# Patient Record
Sex: Male | Born: 1935
Health system: Southern US, Community
[De-identification: ages and names within clinical notes are randomized; demographics above are authoritative.]

## PROBLEM LIST (undated history)

## (undated) DIAGNOSIS — N400 Enlarged prostate without lower urinary tract symptoms: Secondary | ICD-10-CM

## (undated) DIAGNOSIS — I10 Essential (primary) hypertension: Secondary | ICD-10-CM

## (undated) DIAGNOSIS — M1990 Unspecified osteoarthritis, unspecified site: Secondary | ICD-10-CM

## (undated) DIAGNOSIS — R9431 Abnormal electrocardiogram [ECG] [EKG]: Secondary | ICD-10-CM

## (undated) DIAGNOSIS — H8110 Benign paroxysmal vertigo, unspecified ear: Secondary | ICD-10-CM

## (undated) DIAGNOSIS — T8859XA Other complications of anesthesia, initial encounter: Secondary | ICD-10-CM

## (undated) DIAGNOSIS — IMO0001 Reserved for inherently not codable concepts without codable children: Secondary | ICD-10-CM

## (undated) DIAGNOSIS — Z8679 Personal history of other diseases of the circulatory system: Secondary | ICD-10-CM

## (undated) DIAGNOSIS — T4145XA Adverse effect of unspecified anesthetic, initial encounter: Secondary | ICD-10-CM

## (undated) DIAGNOSIS — K219 Gastro-esophageal reflux disease without esophagitis: Secondary | ICD-10-CM

## (undated) DIAGNOSIS — R002 Palpitations: Secondary | ICD-10-CM

## (undated) HISTORY — DX: Essential (primary) hypertension: I10

## (undated) HISTORY — DX: Benign prostatic hyperplasia without lower urinary tract symptoms: N40.0

## (undated) HISTORY — DX: Personal history of other diseases of the circulatory system: Z86.79

## (undated) HISTORY — PX: TRANSURETHRAL RESECTION OF PROSTATE: SHX73

## (undated) HISTORY — PX: KIDNEY STONE SURGERY: SHX686

## (undated) HISTORY — PX: OTHER SURGICAL HISTORY: SHX169

## (undated) HISTORY — PX: JOINT REPLACEMENT: SHX530

## (undated) HISTORY — PX: ANKLE SURGERY: SHX546

## (undated) HISTORY — DX: Palpitations: R00.2

## (undated) HISTORY — DX: Benign paroxysmal vertigo, unspecified ear: H81.10

## (undated) HISTORY — PX: COLON SURGERY: SHX602

## (undated) HISTORY — PX: TONSILLECTOMY: SUR1361

---

## 1988-05-19 HISTORY — PX: HERNIA REPAIR: SHX51

## 1997-12-06 ENCOUNTER — Ambulatory Visit (HOSPITAL_COMMUNITY): Admission: RE | Admit: 1997-12-06 | Discharge: 1997-12-06 | Payer: Self-pay

## 1999-05-09 ENCOUNTER — Encounter: Payer: Self-pay | Admitting: General Surgery

## 1999-05-09 ENCOUNTER — Emergency Department (HOSPITAL_COMMUNITY): Admission: EM | Admit: 1999-05-09 | Discharge: 1999-05-09 | Payer: Self-pay | Admitting: Emergency Medicine

## 1999-06-12 ENCOUNTER — Encounter: Payer: Self-pay | Admitting: General Surgery

## 1999-06-17 ENCOUNTER — Encounter (INDEPENDENT_AMBULATORY_CARE_PROVIDER_SITE_OTHER): Payer: Self-pay

## 1999-06-17 ENCOUNTER — Inpatient Hospital Stay (HOSPITAL_COMMUNITY): Admission: RE | Admit: 1999-06-17 | Discharge: 1999-06-25 | Payer: Self-pay | Admitting: General Surgery

## 2000-02-11 ENCOUNTER — Ambulatory Visit (HOSPITAL_COMMUNITY): Admission: RE | Admit: 2000-02-11 | Discharge: 2000-02-11 | Payer: Self-pay | Admitting: Cardiology

## 2000-05-19 HISTORY — PX: CARDIAC CATHETERIZATION: SHX172

## 2001-01-04 ENCOUNTER — Encounter: Payer: Self-pay | Admitting: Emergency Medicine

## 2001-01-04 ENCOUNTER — Emergency Department (HOSPITAL_COMMUNITY): Admission: EM | Admit: 2001-01-04 | Discharge: 2001-01-05 | Payer: Self-pay | Admitting: Emergency Medicine

## 2001-01-06 ENCOUNTER — Ambulatory Visit (HOSPITAL_BASED_OUTPATIENT_CLINIC_OR_DEPARTMENT_OTHER): Admission: RE | Admit: 2001-01-06 | Discharge: 2001-01-06 | Payer: Self-pay | Admitting: Orthopedic Surgery

## 2001-11-02 ENCOUNTER — Encounter: Payer: Self-pay | Admitting: Gastroenterology

## 2001-11-02 ENCOUNTER — Encounter: Admission: RE | Admit: 2001-11-02 | Discharge: 2001-11-02 | Payer: Self-pay | Admitting: Gastroenterology

## 2002-03-15 ENCOUNTER — Ambulatory Visit (HOSPITAL_BASED_OUTPATIENT_CLINIC_OR_DEPARTMENT_OTHER): Admission: RE | Admit: 2002-03-15 | Discharge: 2002-03-15 | Payer: Self-pay | Admitting: Plastic Surgery

## 2002-03-15 ENCOUNTER — Encounter (INDEPENDENT_AMBULATORY_CARE_PROVIDER_SITE_OTHER): Payer: Self-pay | Admitting: Specialist

## 2003-01-13 IMAGING — CR DG HIP 1V PORT*L*
1 series · 1 of 1 positions shown · non-contrast
Comparison: [DATE].

CLINICAL DATA: Post-arthroplasty.
 PORTABLE LEFT HIP 1 VIEW ? [DATE]:

[view not recorded]
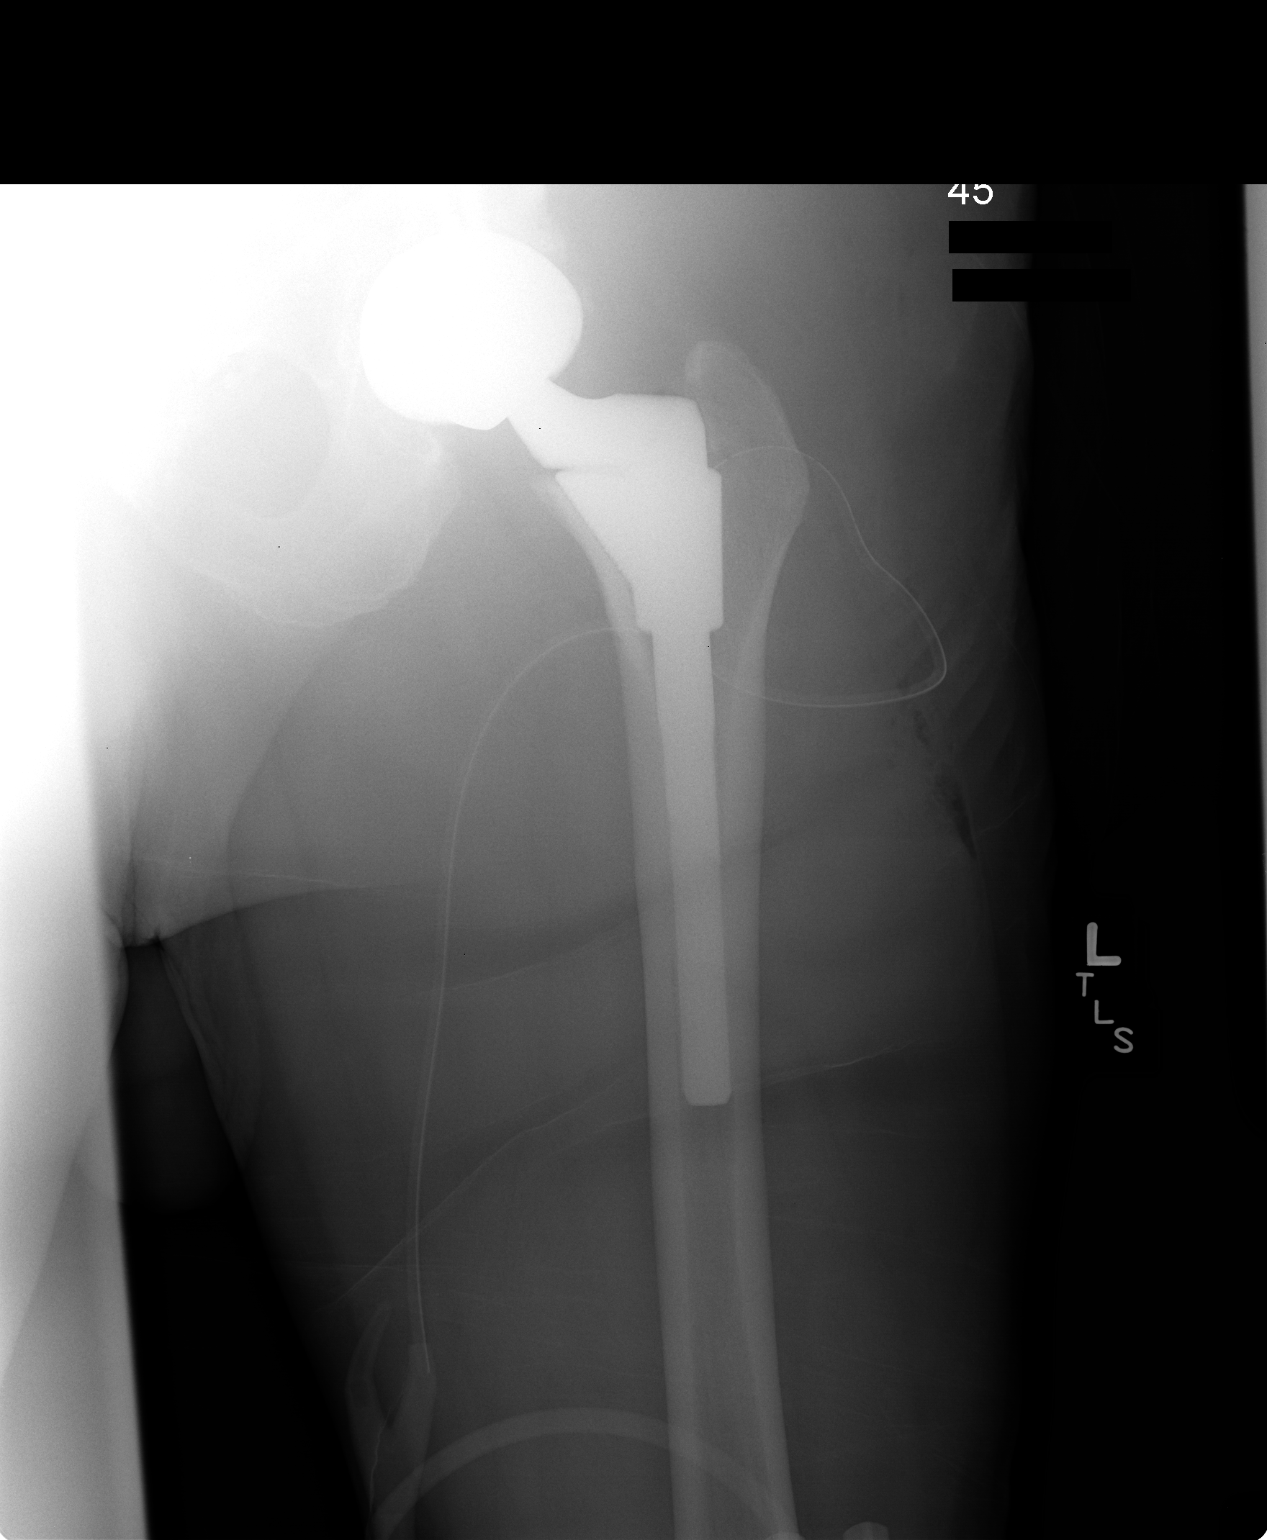

[1 of 1 positions shown; findings below may reference images not displayed]

Good position and alignment following left total hip replacement, in the AP projection.
IMPRESSION: Good position and alignment.

## 2004-05-22 IMAGING — CR DG HIP (WITH OR WITHOUT PELVIS) 2-3V*L*
4 series · 4 of 4 positions shown · non-contrast
Comparison: none

CLINICAL DATA: 68-year-old, osteoarthritis left hip. 
 2 VIEW LEFT HIP WITH AP PELVIS: 
 There is advanced osteoarthritic type degenerative change involving the left hip.  This is asymmetric; the right hip demonstrates minimal degenerative change.  I do not see any definite plain film findings to suggest avascular necrosis.  No acute bony findings and no destructive bony changes.  The pubic symphysis and SI joints are intact.

[view not recorded (1 of 4)]
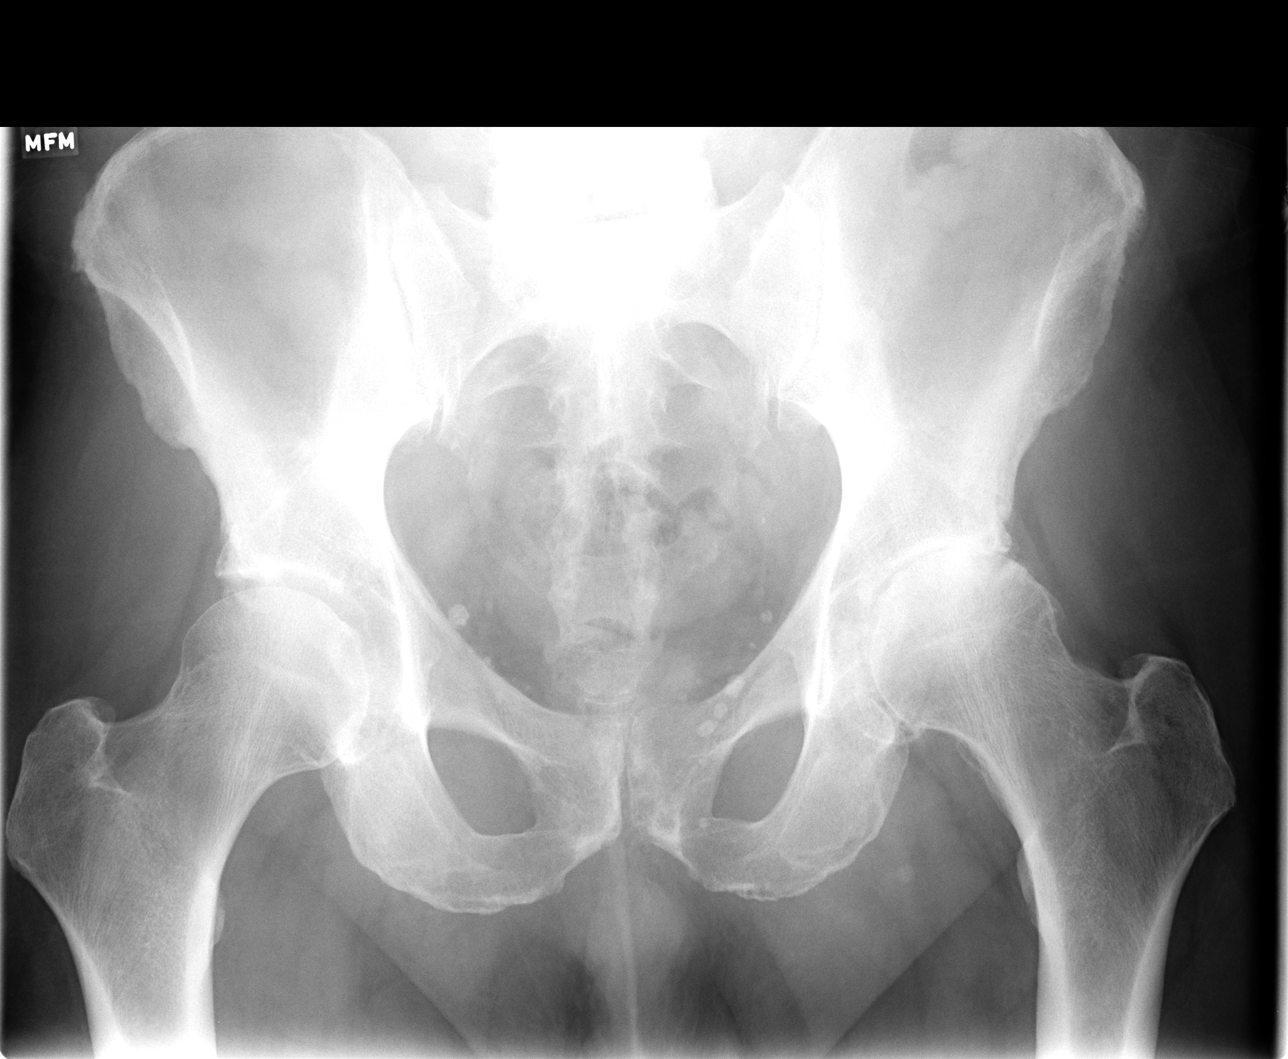

[view not recorded (2 of 4)]
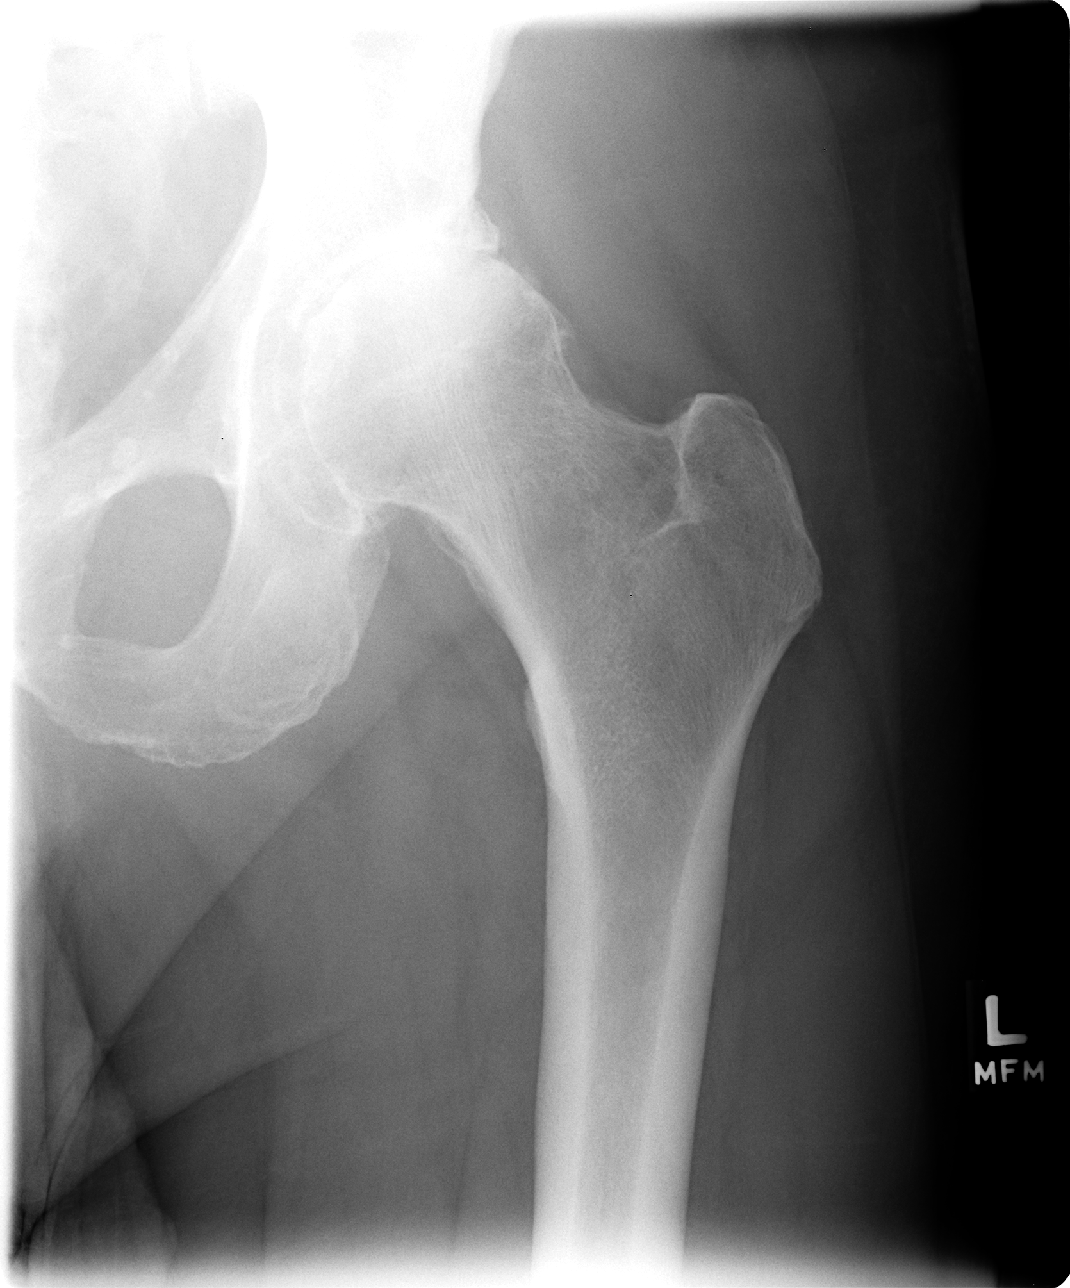

[view not recorded (3 of 4)]
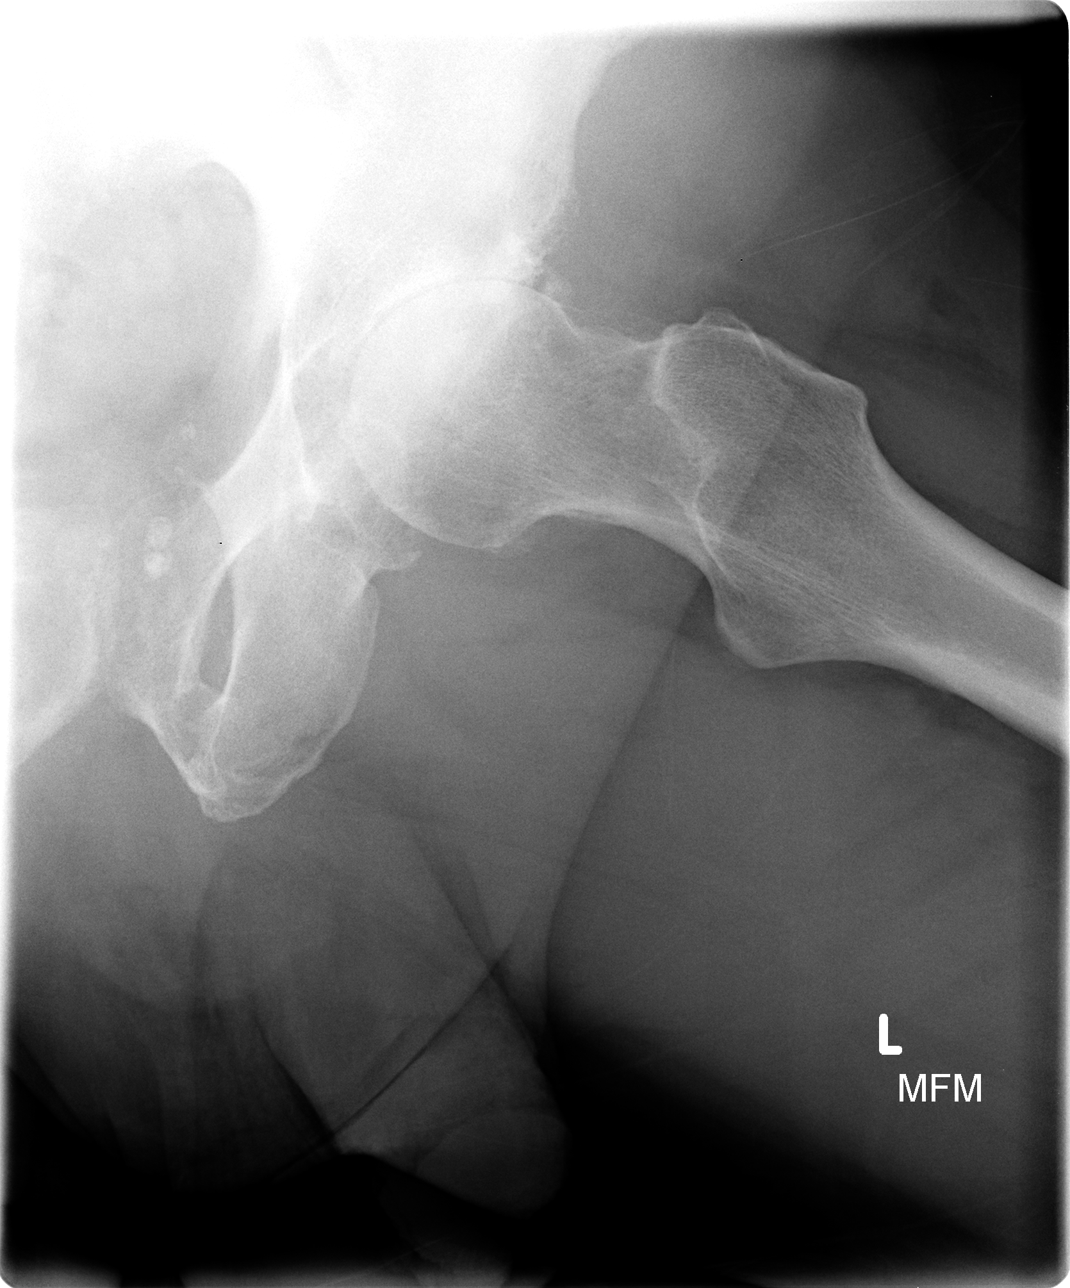

[view not recorded (4 of 4)]
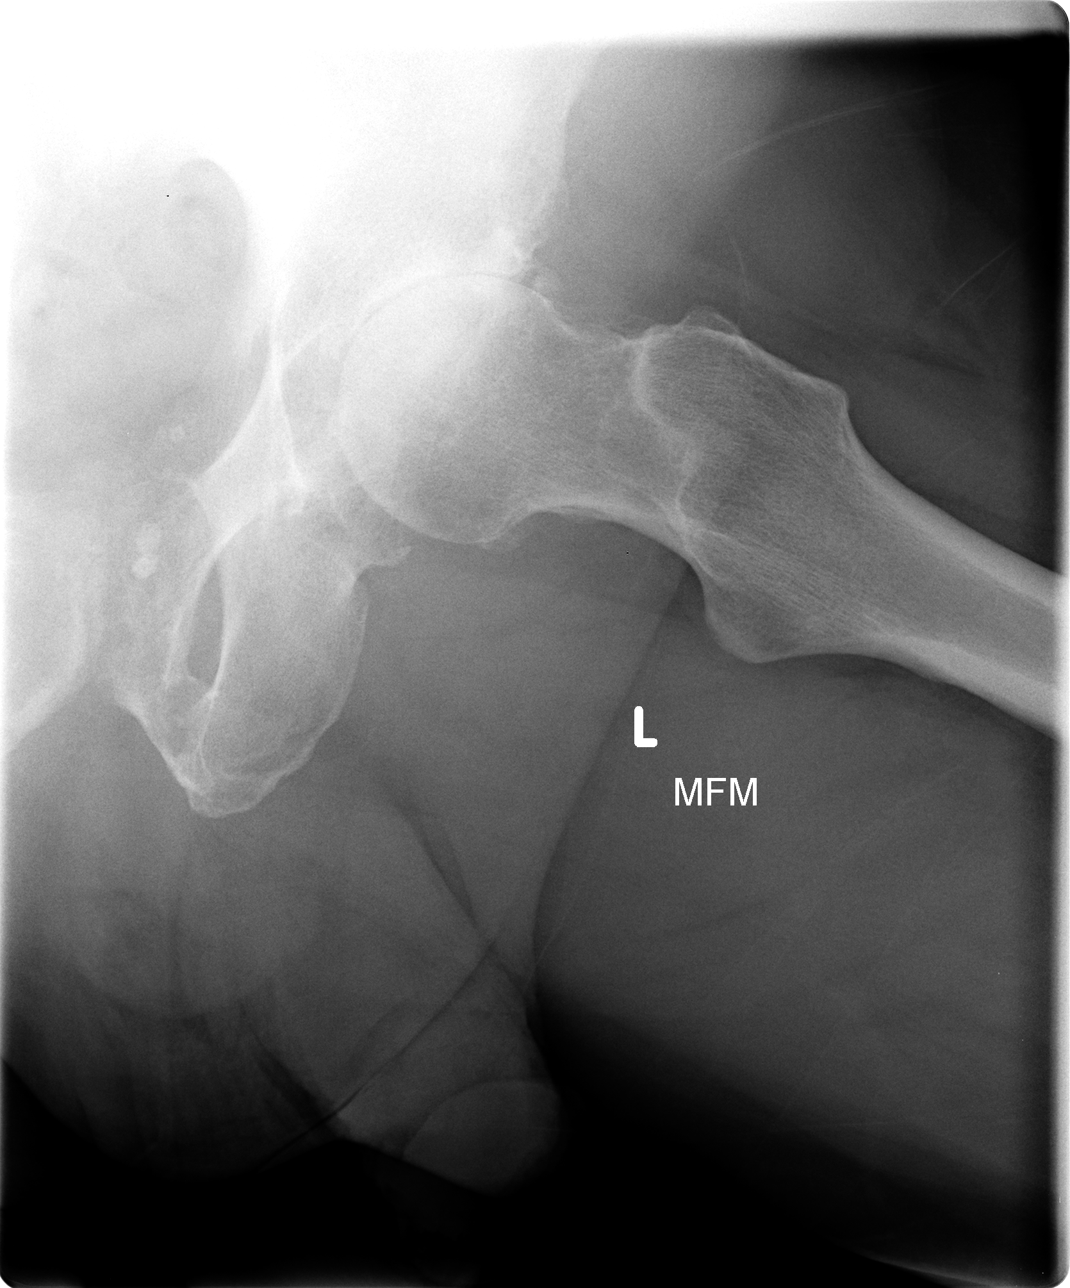

[4 of 4 positions shown; findings below may reference images not displayed]

IMPRESSION: Advanced osteoarthritic type degenerative change involving the left hip.

## 2004-05-29 ENCOUNTER — Inpatient Hospital Stay (HOSPITAL_COMMUNITY): Admission: RE | Admit: 2004-05-29 | Discharge: 2004-06-01 | Payer: Self-pay | Admitting: Orthopedic Surgery

## 2004-05-29 IMAGING — CR DG PORTABLE PELVIS
1 series · 1 of 1 positions shown · non-contrast
Comparison: Pre-operative study of [DATE].

CLINICAL DATA: Post left hip arthroplasty.  
 PORTABLE PELVIS:

[view not recorded]
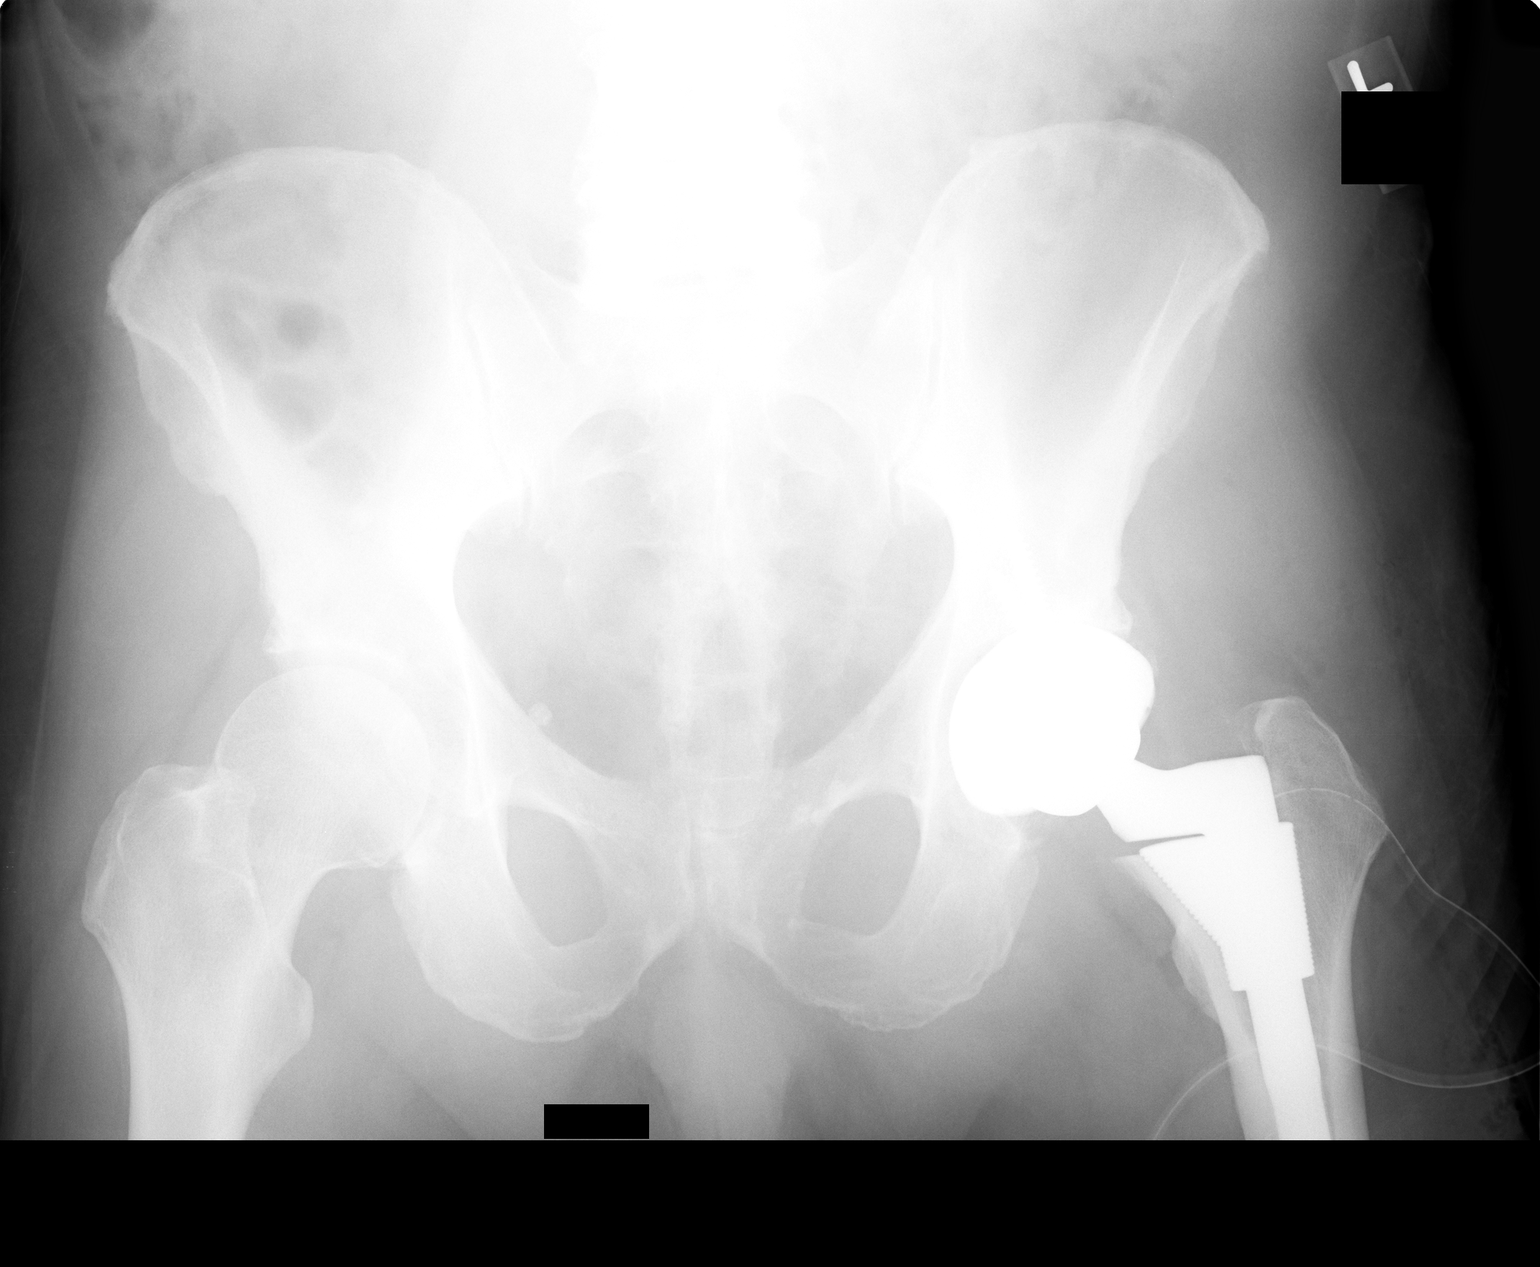

[1 of 1 positions shown; findings below may reference images not displayed]

FINDINGS: Status-post left hip arthroplasty.  All components appear to be in good position and alignment in the AP projection.  Bony pelvis intact.
IMPRESSION: Good position and alignment following left total hip arthroplasty.

## 2006-10-06 ENCOUNTER — Observation Stay (HOSPITAL_COMMUNITY): Admission: EM | Admit: 2006-10-06 | Discharge: 2006-10-07 | Payer: Self-pay | Admitting: Emergency Medicine

## 2006-10-06 IMAGING — CR DG CHEST 1V PORT
1 series · 1 of 1 positions shown · non-contrast
Comparison: None

CLINICAL DATA: Chest pain

CHEST - 1 VIEW

[view not recorded]
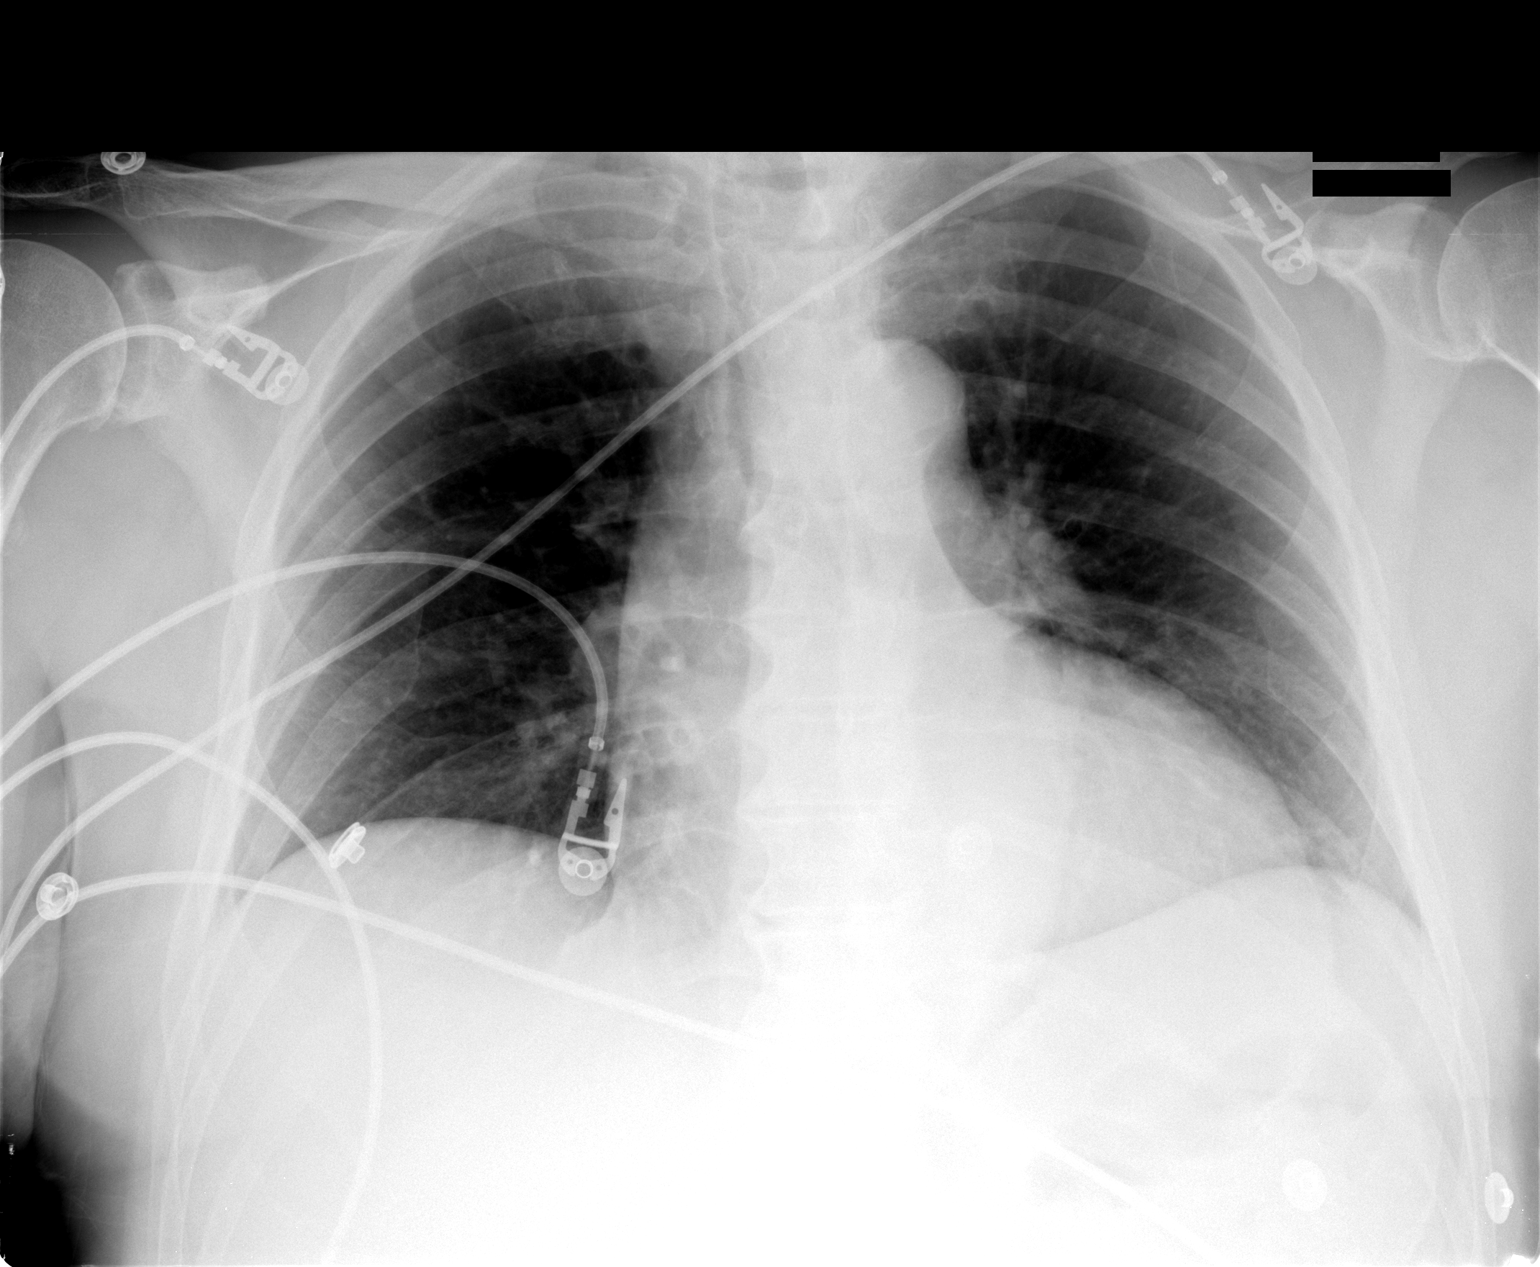

[1 of 1 positions shown; findings below may reference images not displayed]

FINDINGS: Cardiac enlargement.

No effusion or edema.

No airspace opacities.

IMPRESSION

Cardiac enlargement without heart failure

## 2007-03-12 ENCOUNTER — Emergency Department (HOSPITAL_COMMUNITY): Admission: EM | Admit: 2007-03-12 | Discharge: 2007-03-12 | Payer: Self-pay | Admitting: Emergency Medicine

## 2007-06-24 ENCOUNTER — Encounter (INDEPENDENT_AMBULATORY_CARE_PROVIDER_SITE_OTHER): Payer: Self-pay | Admitting: Interventional Radiology

## 2007-06-24 ENCOUNTER — Encounter: Admission: RE | Admit: 2007-06-24 | Discharge: 2007-06-24 | Payer: Self-pay | Admitting: Internal Medicine

## 2007-06-24 ENCOUNTER — Other Ambulatory Visit: Admission: RE | Admit: 2007-06-24 | Discharge: 2007-06-24 | Payer: Self-pay | Admitting: Interventional Radiology

## 2007-06-24 IMAGING — US US SOFT TISSUE HEAD/NECK
1 series · 13 of 25 positions shown · non-contrast
Comparison: none

CLINICAL DATA: The patient had a screening thyroid ultrasound at an outside institution which demonstrated nodules.
 THYROID ULTRASOUND:
TECHNIQUE: Ultrasound examination of the thyroid gland and adjacent soft tissue structures was performed.

[Series 1: us soft tissue head/neck · 0.07mm/px · 13 of 42 slices shown]
[im 1/42]
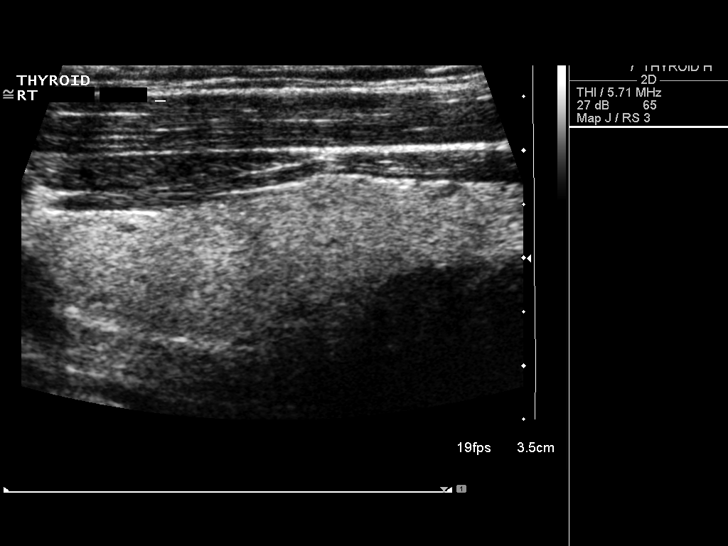
[im 4/42]
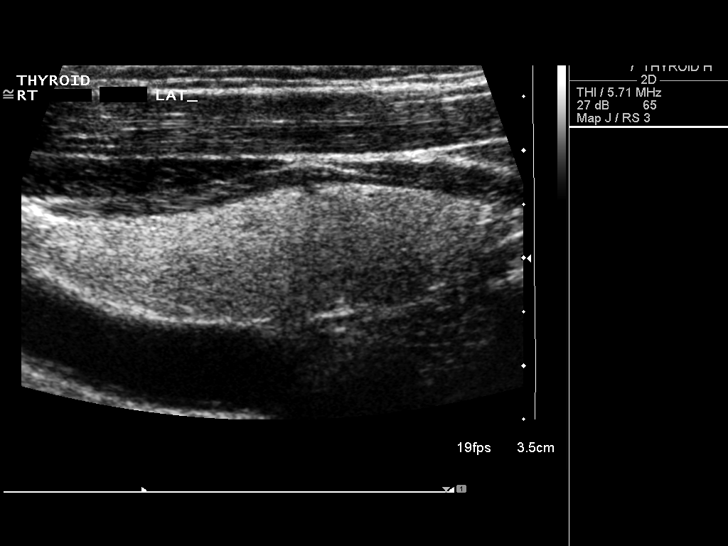
[im 7/42]
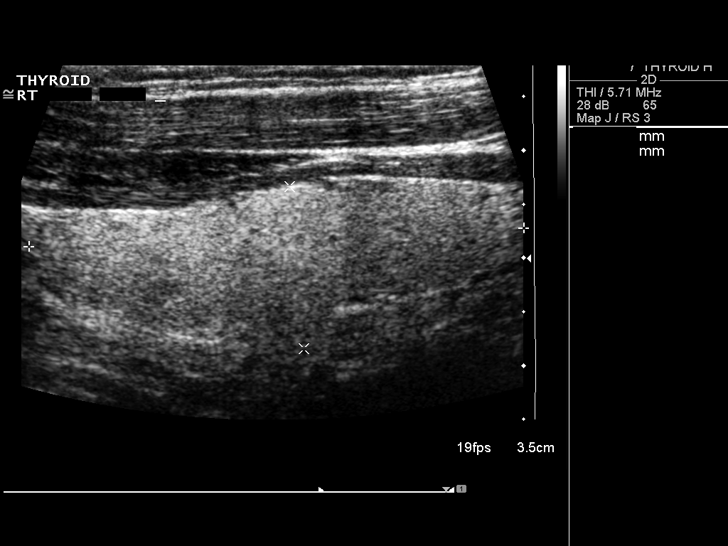
[im 11/42]
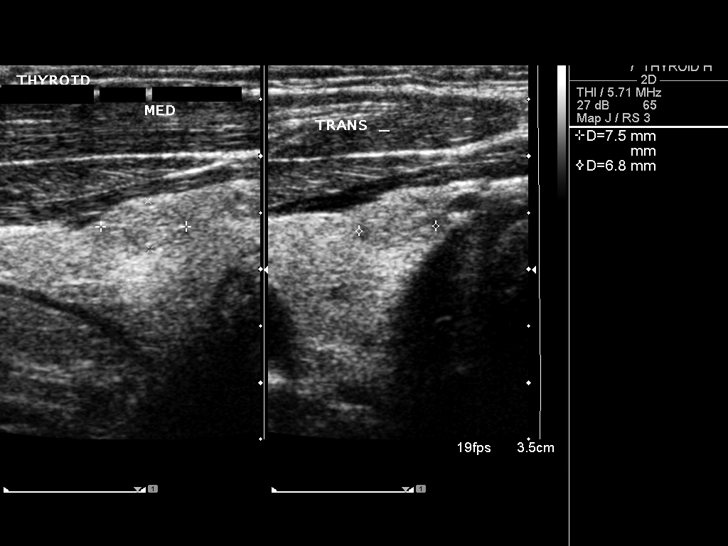
[im 14/42]
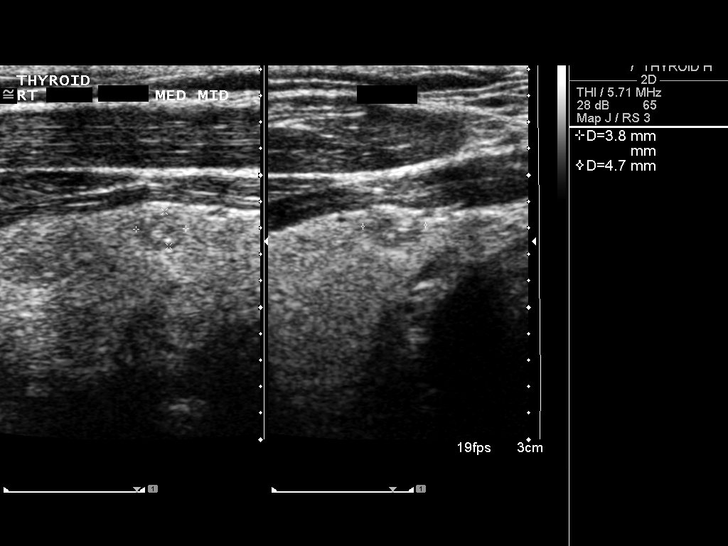
[im 18/42]
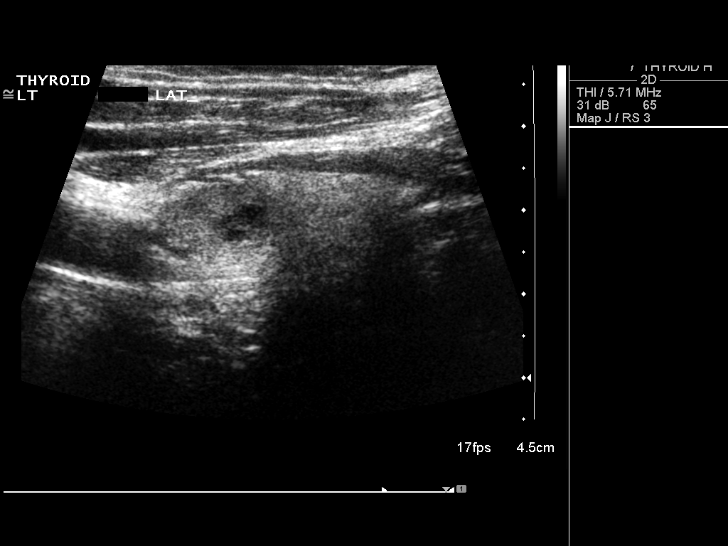
[im 21/42]
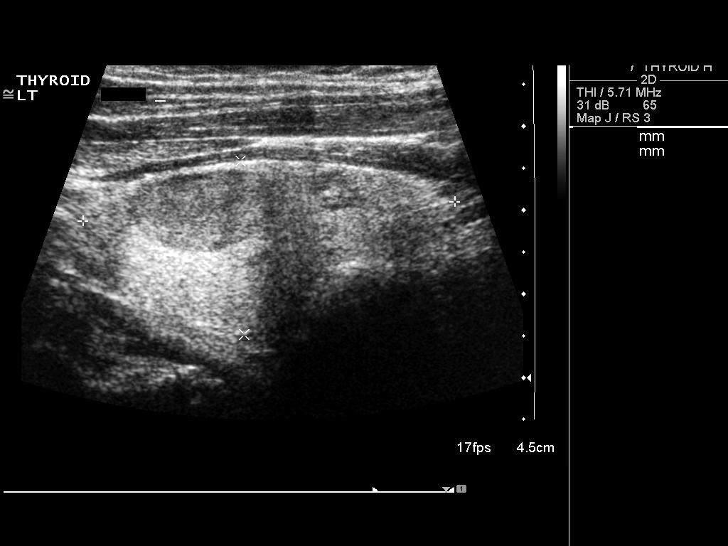
[im 24/42]
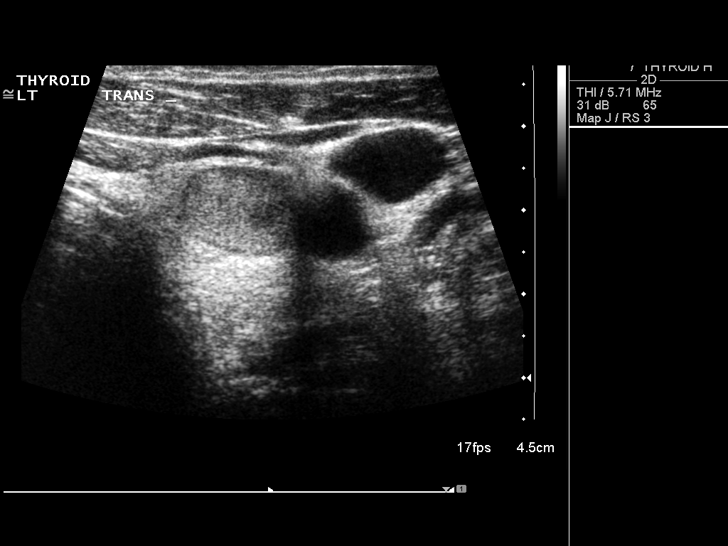
[im 28/42]
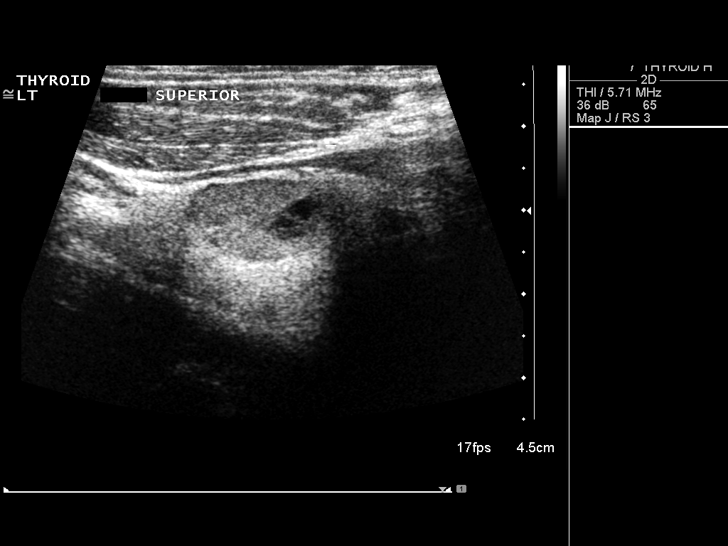
[im 31/42]
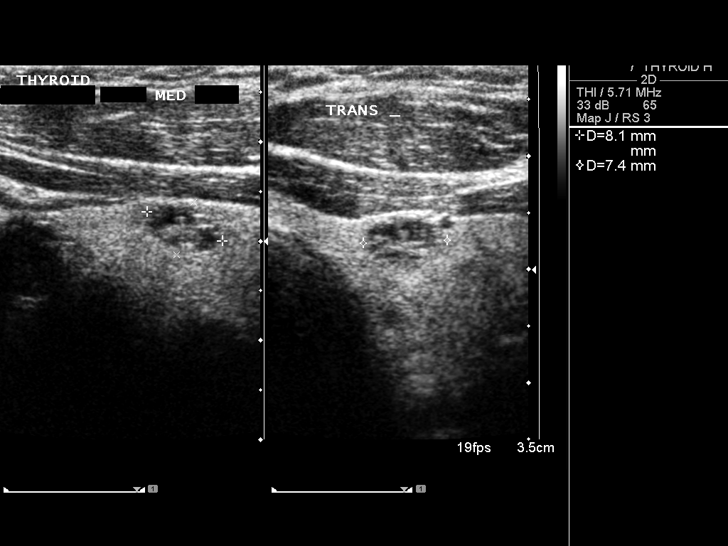
[im 35/42]
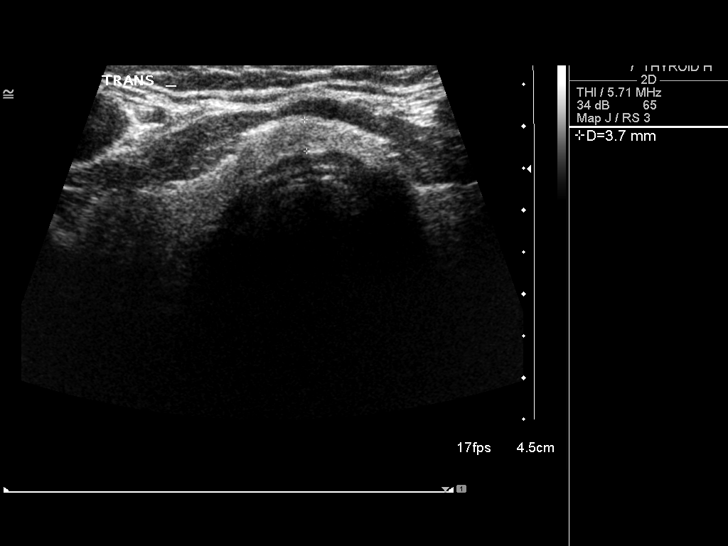
[im 38/42]
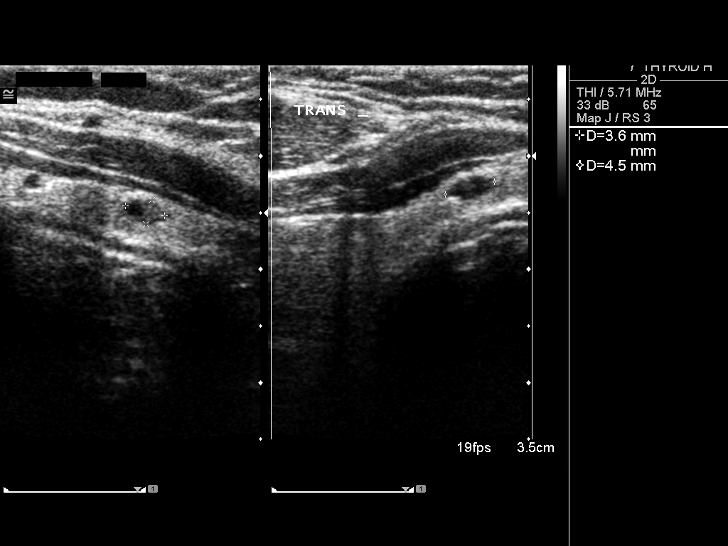
[im 42/42]
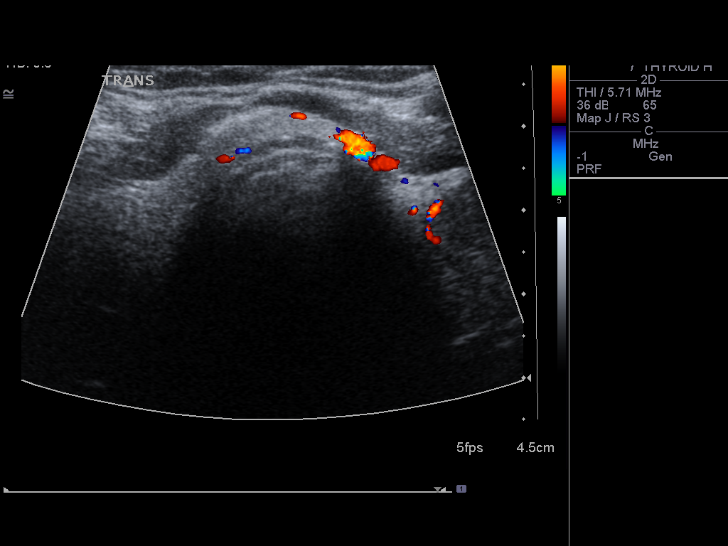

[13 of 25 positions shown; findings below may reference images not displayed]

FINDINGS: The right lobe is 4.6 x 1.5 x 1.8 cm.  
 The left lobe is 4.6 x 2.1 x 2.1 cm.
 The isthmus is 4 mm in thickness.  Thyroid echo texture is heterogeneous diffusely. 
 Bilateral thyroid nodules are present.  There is a nodule in the interpolar region of the right lobe measuring 7.5 x 4.3 x 6.8 mm with microcalcifications.  In the right isthmus, there is a 4 x 3 x 5 mm nodule with microcalcifications.  There is a second isthmic nodule at the midline measuring 4 x 2 x 5 mm with microcalcifications.  Two small nodules in the interpolar region of the left lobe measure 8 x 6 x 7 mm and 4 x 3 x 4 mm.  Both have microcalcifications.  There is a dominant nodule with heterogeneity and marked vascularity in the upper pole of the left lobe measuring 1.8 x 1.1 x 1.6 cm.
IMPRESSION: 1.  Multiple bilateral nodules.  The dominant nodule is in the upper pole of the left lobe measuring 1.8 cm in greatest dimension.  Biopsy is to follow. 
 2.  There are several subcentimeter nodules with microcalcifications which can be followed after the biopsy.

## 2007-06-24 IMAGING — US US BIOPSY
1 series · 9 of 9 positions shown · non-contrast
Comparison: none

CLINICAL DATA: Thyroid nodule.  
 ULTRASOUND-GUIDED FINE NEEDLE ASPIRATION, LEFT LOBE OF THYROID:

[Series 1: us biopsy · 9 acquisitions, 9 frames shown]
[im 1/9]
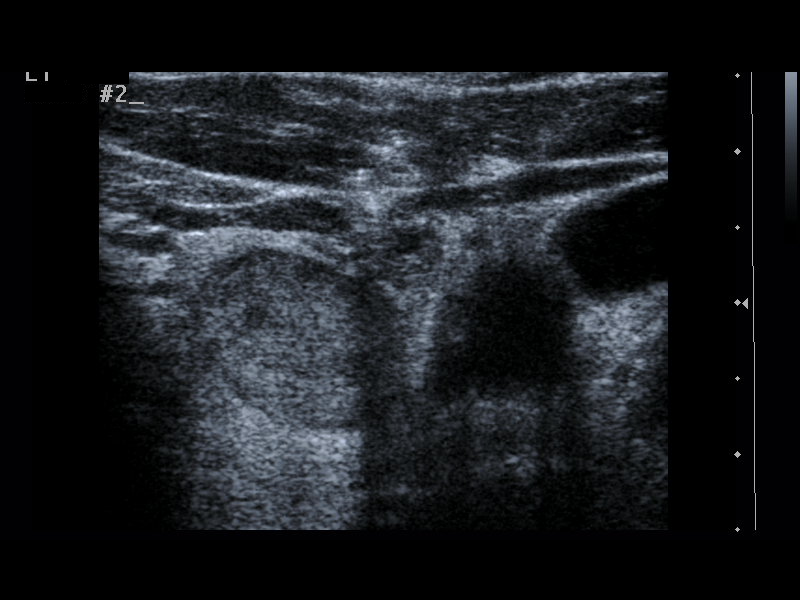
[im 2/9]
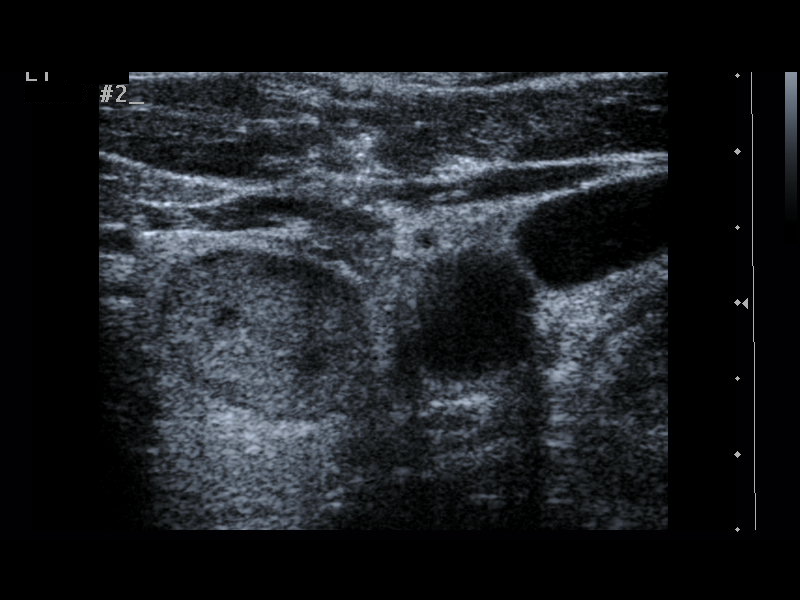
[im 3/9]
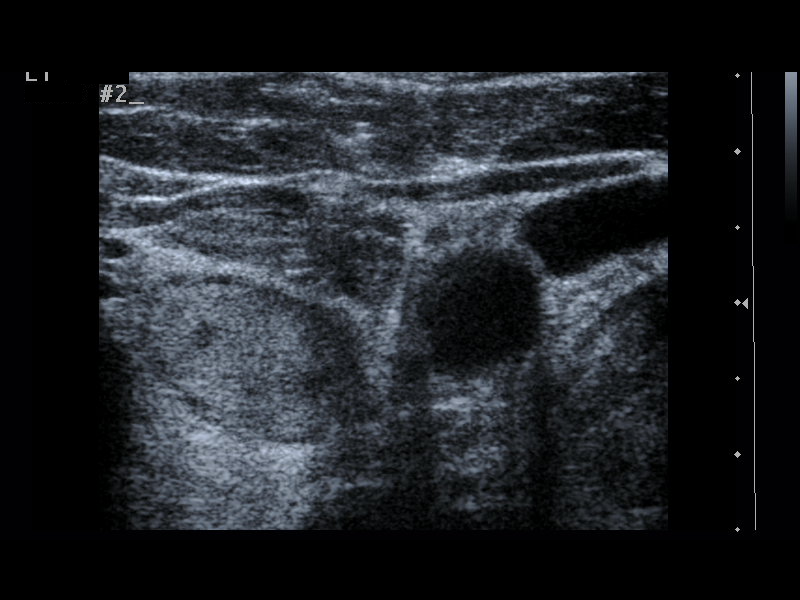
[im 4/9]
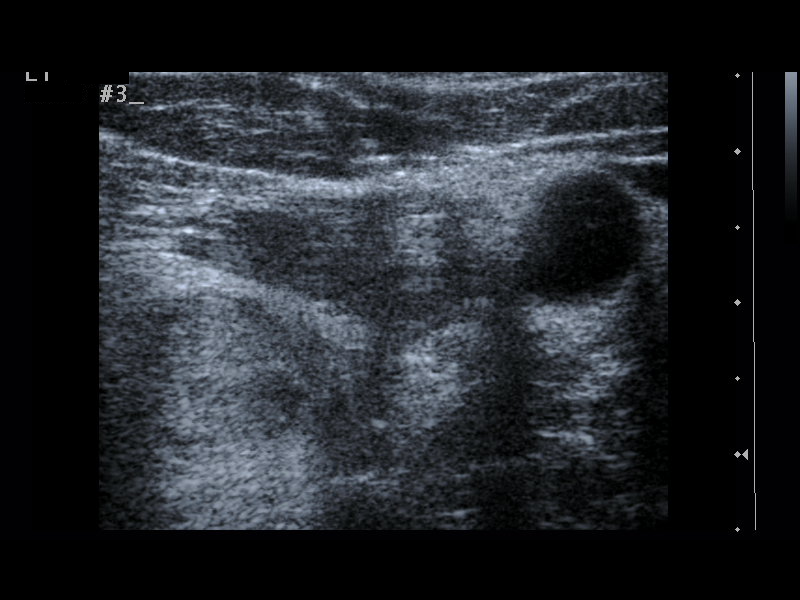
[im 5/9]
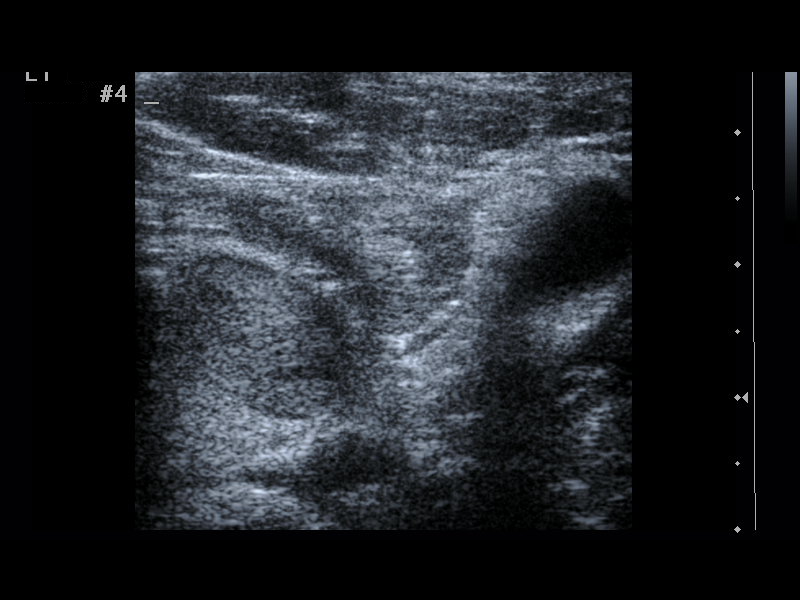
[im 6/9]
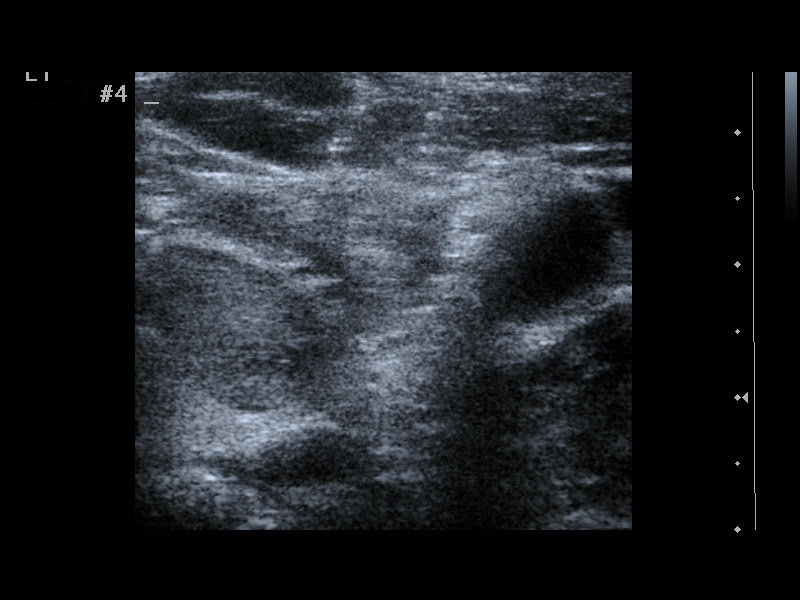
[im 7/9]
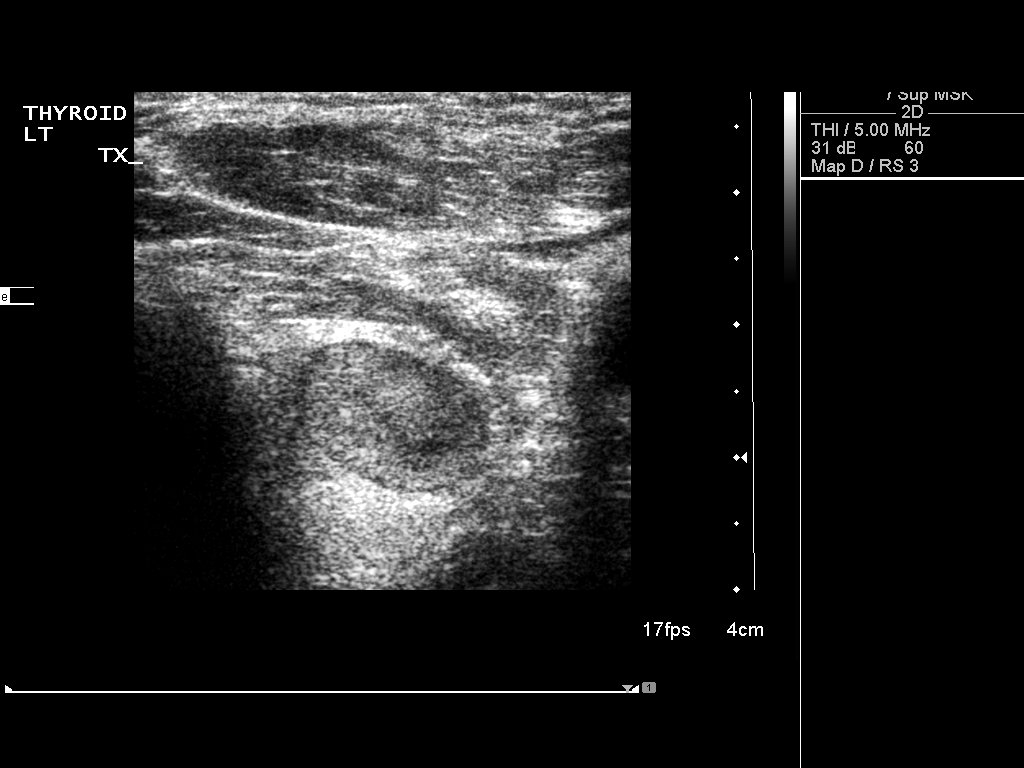
[im 8/9]
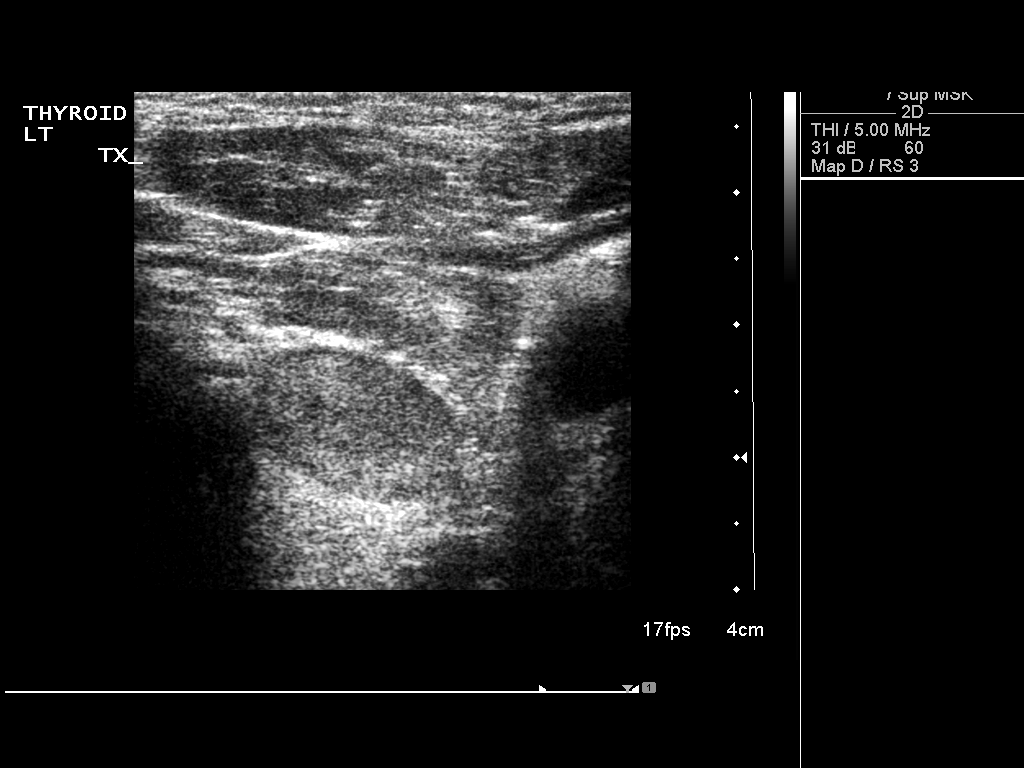
[im 9/9]
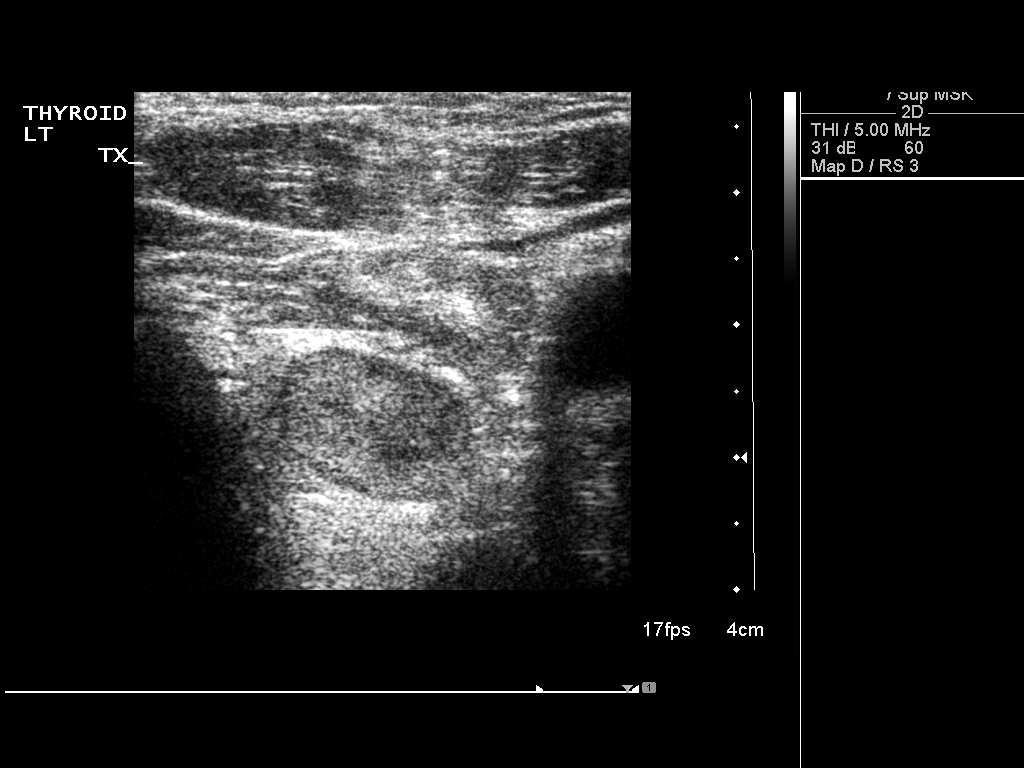

[9 of 9 positions shown; findings below may reference images not displayed]

FINDINGS: The above procedure was thoroughly discussed with the patient and written informed consent was obtained. 
 Ultrasound was then performed to localize and mark an adequate site for the biopsy.  The patient was then prepped and draped in a normal sterile fashion.  1% lidocaine was used for local anesthesia.  Using direct ultrasound guidance, two passes were made using a 25 gauge hypodermic needle and two passes were made using a 22 gauge INRAD needle into the nodule located within the left lobe of the thyroid.  Ultrasound confirmed placement of the needle on all four occasions.  The specimens were given to Pathology for further analysis.  Post procedure imaging demonstrated no hematoma or immediate complication.  The patient tolerated the procedure well.
IMPRESSION: Successful ultrasound-guided fine needle aspiration, left lobe of the thyroid.  Final pathology pending.

## 2007-06-24 IMAGING — US US BIOPSY
1 series · 2 of 2 positions shown · non-contrast
Comparison: none

CLINICAL DATA: Thyroid nodule.  
 ULTRASOUND-GUIDED FINE NEEDLE ASPIRATION, LEFT LOBE OF THYROID:

[Series 1: us biopsy · 2 acquisitions, 2 frames shown]
[im 1/2]
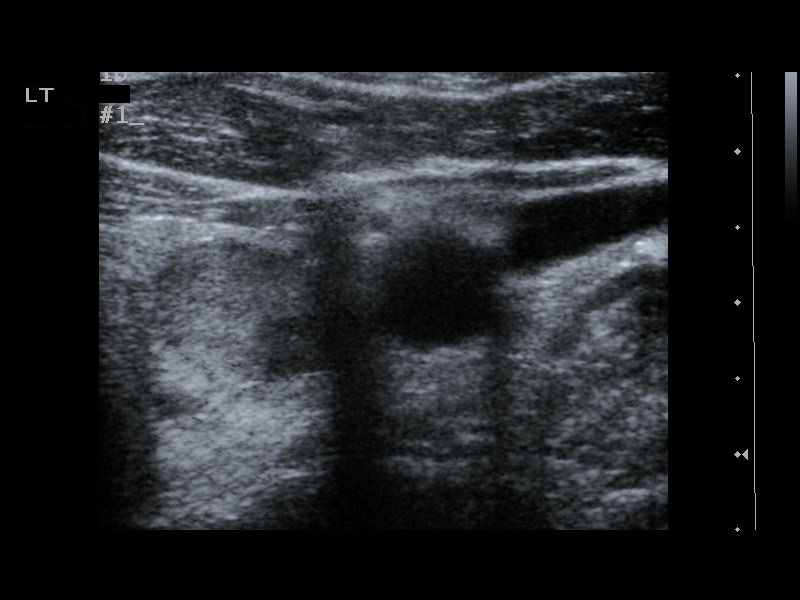
[im 2/2]
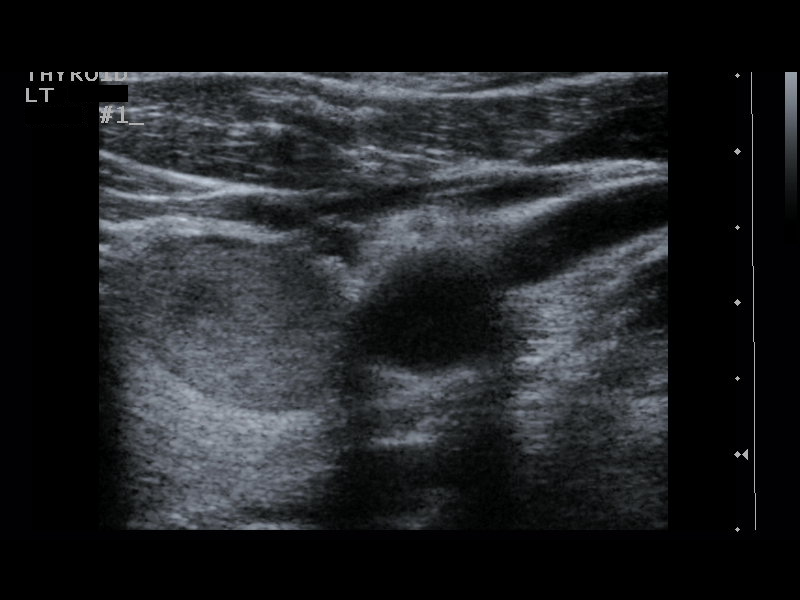

[2 of 2 positions shown; findings below may reference images not displayed]

FINDINGS: The above procedure was thoroughly discussed with the patient and written informed consent was obtained. 
 Ultrasound was then performed to localize and mark an adequate site for the biopsy.  The patient was then prepped and draped in a normal sterile fashion.  1% lidocaine was used for local anesthesia.  Using direct ultrasound guidance, two passes were made using a 25 gauge hypodermic needle and two passes were made using a 22 gauge INRAD needle into the nodule located within the left lobe of the thyroid.  Ultrasound confirmed placement of the needle on all four occasions.  The specimens were given to Pathology for further analysis.  Post procedure imaging demonstrated no hematoma or immediate complication.  The patient tolerated the procedure well.
IMPRESSION: Successful ultrasound-guided fine needle aspiration, left lobe of the thyroid.  Final pathology pending.

## 2007-12-21 ENCOUNTER — Ambulatory Visit (HOSPITAL_COMMUNITY): Admission: RE | Admit: 2007-12-21 | Discharge: 2007-12-21 | Payer: Self-pay | Admitting: Orthopedic Surgery

## 2007-12-21 IMAGING — CR DG CHEST 2V
2 series · 2 of 2 positions shown · non-contrast
Comparison: [DATE].

CLINICAL DATA: Left shoulder pain.  Preoperative chest.

CHEST - 2 VIEW

[w chest pa]
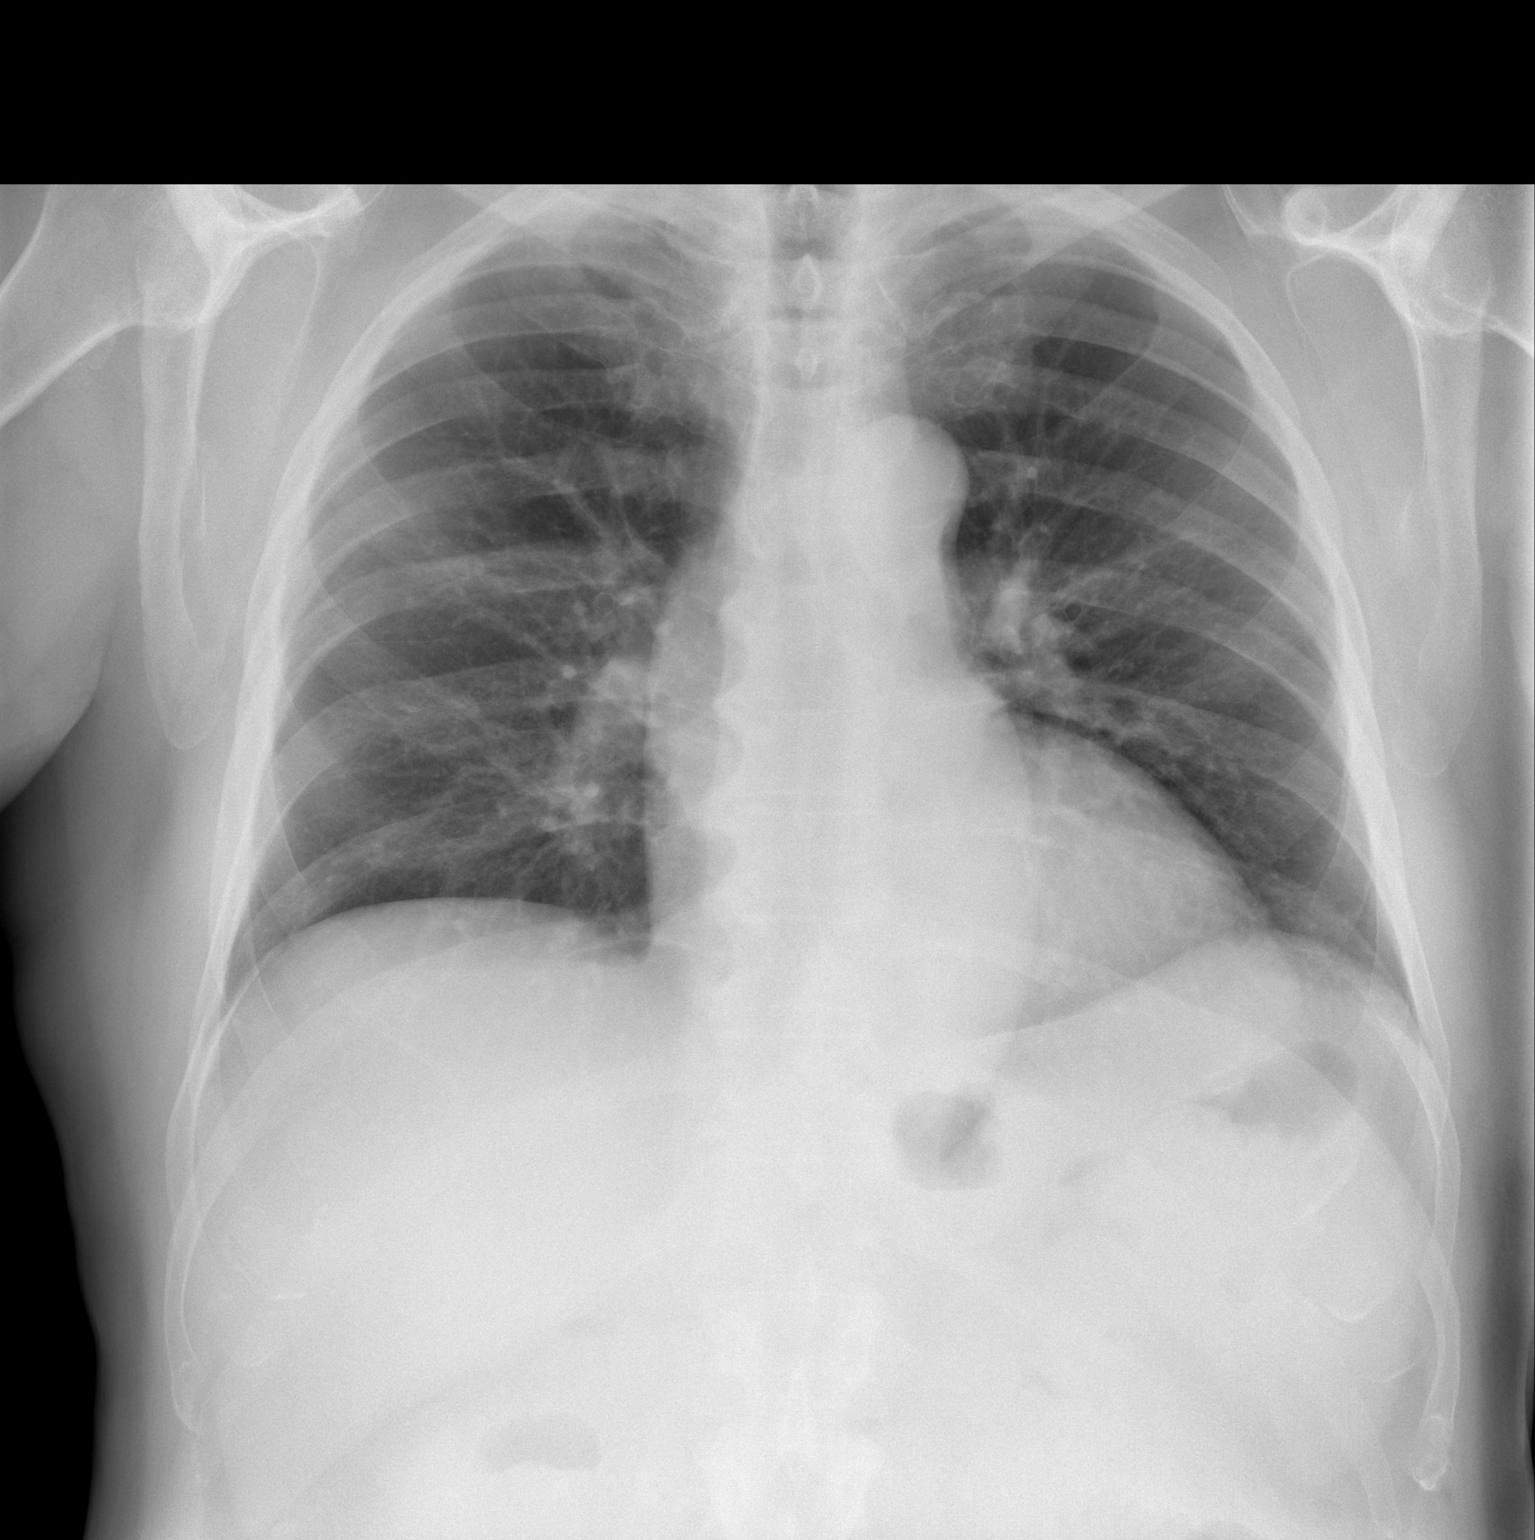

[w chest lat]
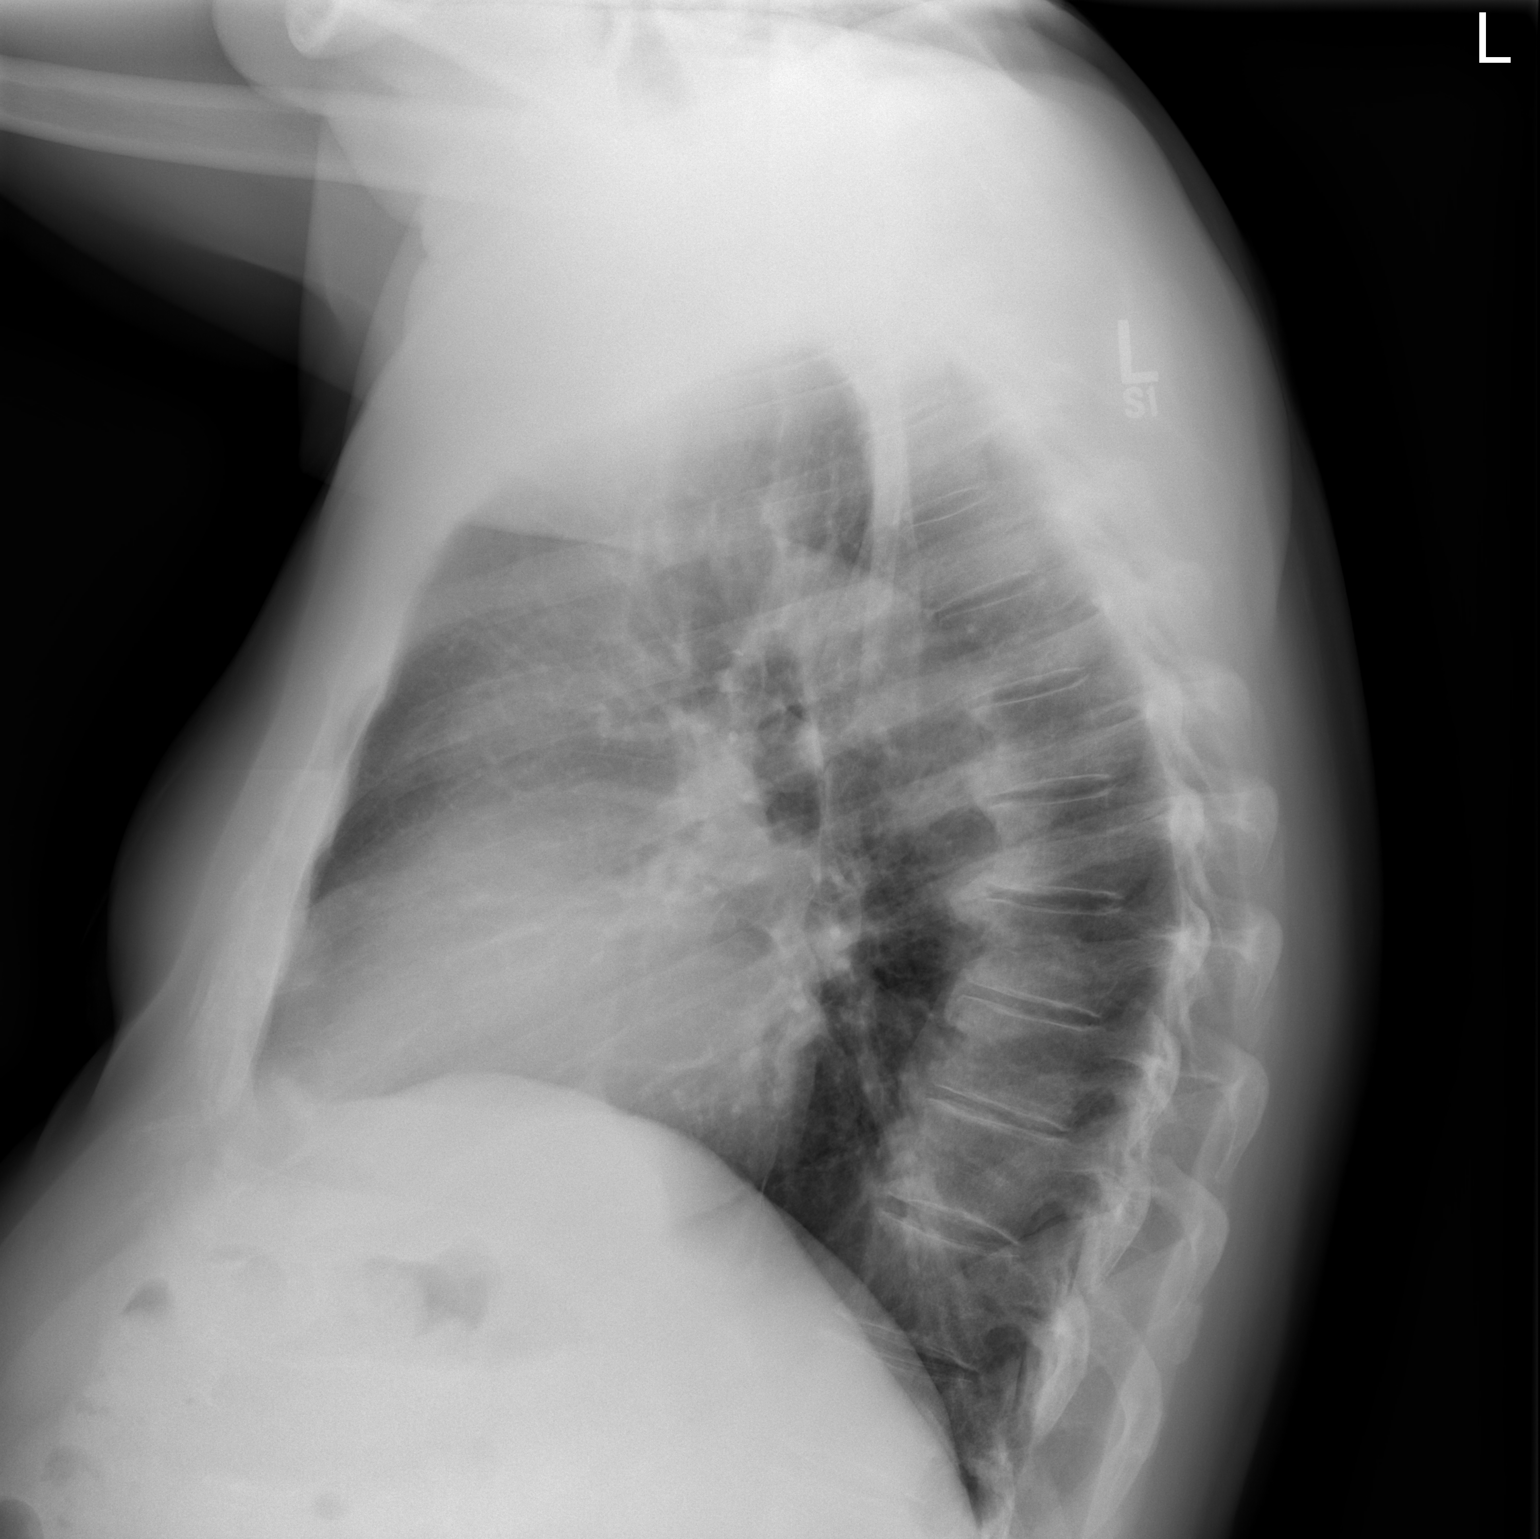

[2 of 2 positions shown; findings below may reference images not displayed]

FINDINGS: There are mild stable chronic bronchitic changes.  The
heart is borderline in size.  No infiltrates or edematous changes.
Mild thoracic osteophytosis is noted.
IMPRESSION: Mild chronic bronchitic changes.  Borderline cardiac size.  No
acute findings.

## 2009-06-20 HISTORY — PX: US ECHOCARDIOGRAPHY: HXRAD669

## 2010-04-02 ENCOUNTER — Ambulatory Visit: Payer: Self-pay | Admitting: Cardiology

## 2010-08-01 ENCOUNTER — Ambulatory Visit (INDEPENDENT_AMBULATORY_CARE_PROVIDER_SITE_OTHER): Payer: Medicare Other | Admitting: Cardiology

## 2010-08-01 DIAGNOSIS — Z79899 Other long term (current) drug therapy: Secondary | ICD-10-CM

## 2010-08-01 DIAGNOSIS — I471 Supraventricular tachycardia: Secondary | ICD-10-CM

## 2010-08-01 DIAGNOSIS — I119 Hypertensive heart disease without heart failure: Secondary | ICD-10-CM

## 2010-08-15 ENCOUNTER — Other Ambulatory Visit: Payer: Self-pay | Admitting: Cardiology

## 2010-08-15 DIAGNOSIS — K219 Gastro-esophageal reflux disease without esophagitis: Secondary | ICD-10-CM

## 2010-10-01 NOTE — H&P (Signed)
NAME:  [name] [name], [name] [name]                  ACCOUNT NO.:  [id number]   MEDICAL RECORD NO.:  [id number]          PATIENT TYPE:  INP   LOCATION:  3735                         FACILITY:  [facility]   PHYSICIAN:  Vesta Mixer, M.D. DATE OF BIRTH:  August 13, 1935   DATE OF ADMISSION:  10/06/2006  DATE OF DISCHARGE:                              HISTORY & PHYSICAL   HISTORY OF PRESENT ILLNESS:  Fredy Gladu is a middle-aged gentleman with  a history of palpitations and chest pain.  He was admitted for a 23-hour  observation, after having some episodes and some palpitations.   The patient has a long history of cardiac problems.  He has had numerous  frequent premature ventricular contractions.  He is status post  radiofrequency ablation of these PVCs up in a  [place] clinic.  He has  had numerous episodes of chest pains and in 2001 had a heart  catheterization by Dr. Deborah Chalk.  This revealed essentially normal  coronary arteries.  The patient has overall done fairly well.  He has  continued to have some intermittent episodes of palpitations.   Today he was driving his car and felt palpitations.  These persisted and  then he started having some episodes of chest discomfort.  He drove  himself to the hospital.  The chest pain lasted for approximately 30  minutes to 45 minutes, and then resolved spontaneously.  He also states  that his heart has quieted down quite a bit.   The patient denies any syncope or presyncope.  He denies any nausea,  vomiting, diarrhea or diaphoresis.  He denies any PND, orthopnea or  congestive heart failure symptoms.   CURRENT MEDICATIONS:  1. Celebrex as needed.  2. K tablets 3 times a day.  3. Baby aspirin 81 mg a day.  4. Diovan 80 mg a day.  5. Metoprolol 50 mg a day.  6. Prevacid 30 mg a day.   ALLERGIES:  HE HAS NO KNOWN DRUG ALLERGIES.   PAST MEDICAL HISTORY:  1. History of Palpitations.  The patient has a history of ventricular      ectopy.  He is status post  radiofrequency ablation at the [facility] in the early 90s.  2. Kidney stones.  3. Hypertension.   SOCIAL HISTORY:  The patient is a nonsmoker.  He drinks alcohol (wine)  almost daily.   FAMILY HISTORY:  His mother has a history of ventricular ectopy.   REVIEW OF SYSTEMS:  This is reviewed and is essentially negative, except  for as noted in the HPI.  He denies any syncope or presyncope.  He  denies any nausea, vomiting or diarrhea.  He denies any heat or cold  intolerance.  He has not had any cough or sputum production.  He does  have some problems with allergies.   PHYSICAL EXAMINATION:  GENERAL:  On exam he is a middle-aged gentleman  in no acute distress.  He is alert and oriented x3. His mood and affect  are normal.  VITAL SIGNS:  Heart rate 53, blood pressure 129/90.  HEENT: Exam reveals 2+ carotids.  He has no bruits, no JVD, no  thyromegaly.  LUNGS: Clear to auscultation.  HEART: Regular rate S1-S2 and somewhat bradycardic.  ABDOMINAL:  Reveals good bowel sounds and is nontender.  EXTREMITIES:  Has no clubbing, cyanosis or edema.  NEUROLOGIC:  Exam was nonfocal.   EKG:  Reveals sinus bradycardia.  He has T-wave inversions in leads V1-  V5.  There were no acute changes from his previous EKG in March 2008.   LABORATORY DATA:  Pending.   IMPRESSION AND PLAN:  1. CHEST PAIN.  The patient presents with some chest pain that lasted      approximately 30 minutes.  It resolved spontaneously, and there      were no acute EKG changes.  He has had a normal heart      catheterization, but this was approximately 8 years ago.  I would      like to admit him for a 23-hour observation.  We will collect      cardiac enzymes and repeat his EKG in the morning.  If he is found      the  workup as negative, then we can probably discharge him to home      tomorrow; and proceed with an outpatient evaluation.  If he has any      further problems, then we certainly should proceed  with the      additional evaluation.   1. PALPITATIONS.  The patient had a history of radiofrequency      ablation.  We will monitor him overnight.   All of his other medical problems are stable.           ______________________________  Vesta Mixer, M.D.     PJN/MEDQ  D:  10/06/2006  T:  10/07/2006  Job:  [id number]   cc:   Maretta Bees. Vonita Moss, M.D.  Cassell Clement, M.D.

## 2010-10-04 NOTE — Op Note (Signed)
NAME:  [name] [name], [name] [name]                  ACCOUNT NO.:  [id number]   MEDICAL RECORD NO.:  [id number]          PATIENT TYPE:  INP   LOCATION:  0007                         FACILITY:  [facility]   PHYSICIAN:  Ollen Gross, M.D.    DATE OF BIRTH:  1936/03/31   DATE OF PROCEDURE:  05/29/2004  DATE OF DISCHARGE:                                 OPERATIVE REPORT   PREOPERATIVE DIAGNOSIS:  Osteoarthritis, left hip.   POSTOPERATIVE DIAGNOSIS:  Osteoarthritis, left hip.   PROCEDURE:  Left total hip arthroplasty, metal-on-metal.   SURGEON:  Ollen Gross, M.D.   ASSISTANT:  Avel Peace, PA-C.   ANESTHESIA:  Spinal.   ESTIMATED BLOOD LOSS:  600.   DRAINS:  Hemovac x1.   COMPLICATIONS:  None.   CONDITION:  Stable to the recovery room.   CLINICAL NOTE:  [name] is a 75 year old male with severe end-stage  arthritis of the left hip with progressively worsening symptoms.  He has  pain refractory to nonoperative management and presents now for left total  hip arthroplasty.   PROCEDURE IN DETAIL:  After successful administration of spinal anesthetic,  the patient was placed in the right lateral decubitus position with the  right side up and held with a hip positioner.  The left lower extremity was  isolated from his perineum with plastic drapes and prepped and draped in the  usual sterile fashion.  A short posterolateral incision was made with a 10  blade through the subcutaneous tissue to the fascia lata, which was incised  in line with the skin incision.  The sciatic nerve is palpated and  protected, and the short rotators are isolated off the femur.  A  capsulectomy is performed, and the hip is dislocated.  The center of the  femoral head is marked, and a trial prosthesis placed, such that the center  of the trial head corresponds to the center of the native femoral head.  An  osteotomy line is marked on the femoral neck, and osteotomy is made with an  oscillating saw.  The femur is then  retracted anteriorly to gain acetabular  exposure.   Acetabular reaming starts at a 47, coursing increments of 2 to a 55, and  then a 56 mm Pinnacle acetabular shell is placed in anatomic position and  transfixed with two dome screws.  A trial 36 mm neutral liner is placed.   The femur is prepared, first with the canal finder and with irrigation.  Axial reaming is performed up to 13.5 mm with proximal reaming to an 11F and  the sleeve machine to a large.  An 11F large trial sleeve is placed with an  18 x 13 stem and a 36+12 neck.  We went approximately 10 degrees beyond the  native anteversion, which was neutral.  The 36+0 head trial is placed, and  the hip is reduced.  We had outstanding stability with full extension and  full external rotation, 70 degrees flexion, 40 degrees adduction, 90 degrees  internal rotation, 90 degrees flexion, and 90 degrees internal rotation.  I  was also able  to flex him up to 140 degrees without any impingement or  dislocation.  The hip is subsequently dislocated, and all trials are  removed.  The apex hole eliminator was placed into the acetabular shell, and  the 36 mm Ultramet metal liner is placed.  This is a metal-on-metal hip  replacement.  The 2F large sleeve is placed with an 18 x 13 stem and a  36+12 neck.  The 36+0 head is placed, and the hip is reduced with same-  stability parameters.  The wound is copiously irrigated with saline  solution, and the short rotator is reattached to the femur through drill  holes.  Fascia lata is closed over a Hemovac drain with interrupted #1  Vicryl.  The subcu was closed with #1 and then 2-0 Vicryl, and subcuticular  with running 4-0 Monocryl.  The drain is hooked to suction.  The incision is  cleaned and dried.  Steri-Strips and a bulky sterile dressing applied.  He  is then placed into a knee immobilizer, awakened, and transported to  recovery in stable condition.     [name] [name]   FA/MEDQ  D:  05/29/2004  T:   05/29/2004  Job:  [id number]

## 2010-10-04 NOTE — Discharge Summary (Signed)
NAME:  [name] [name], [name] [name]                  ACCOUNT NO.:  [id number]   MEDICAL RECORD NO.:  [id number]          PATIENT TYPE:  INP   LOCATION:  0462                         FACILITY:  [facility]   PHYSICIAN:  Ollen Gross, M.D.    DATE OF BIRTH:  April 04, 1936   DATE OF ADMISSION:  05/29/2004  DATE OF DISCHARGE:  06/01/2004                                 DISCHARGE SUMMARY   ADMISSION DIAGNOSES:  1.  Left hip osteoarthritis.  2.  History of premature ventricular contractions.  3.  History of diverticulosis.  4.  Renal calculi.   DISCHARGE DIAGNOSES:  1.  Left hip osteoarthritis, status post left total hip arthroplasty, metal-      on-metal construct.  2.  History of premature ventricular contractions.  3.  History of diverticulosis.  4.  Renal calculi.  5.  Right ankle strain.   PROCEDURE:  On May 29, 2004, left total hip arthroplasty, metal-on-metal  construct.   SURGEON:  Ollen Gross, M.D.   ASSISTANT:  Alexzandrew L. Perkins, P.A.-C.   ANESTHESIA:  Spinal.   BLOOD LOSS:  600 cc.   DRAINS:  Hemovac x1.   CONSULTS:  None.   BRIEF HISTORY:  [name] [name] is a 75 year old male with severe end-stage  arthritis of the left hip, progressively worsening symptoms.  The pain has  been refractory to nonoperative management.  Now presents for a total hip.   LABORATORY DATA:  CBC preop:  Hemoglobin 16.3, hematocrit 48.2.  Differential within normal limits.  Postop hemoglobin 13.7.  Last noted H&H  12.1 and 35.6.  PT/PTT preop:  Pro time 11.9, PTT 29.  Serial pro times  followed:  Last noted PT/INR 17 and 1.6.  Chem panel on admission all within  normal limits with the exception of minimally elevated ALT of 47.  Serial  BMETs are followed.  Sodium did drop from 140 down to 133, back up to 135.  Glucose went up from 106 to 131, back down to 132.  Calcium dropped  from  9.2 to 8.1.  Urinalysis on admission:  Small leukocyte esterase with 36  white cells and 0-2 red cells, otherwise negative.   Blood group type B-.   Left hip x-rays on May 22, 2004:  Advanced osteoarthritis, degenerative  changes involving the left hip.  Portable pelvis on May 29, 2004:  Good  positional alignment of left hip.  Left hip films on May 29, 2004:  Good  position and alignment, left total hip.   HOSPITAL COURSE:  Patient was admitted to [facility] , taken to  the OR, and underwent the above procedure.  Later was transferred to the  recovery room and then to the orthopedic floor.  Placed on PCA and p.o.  analgesics for pain control.   On day #1, he started to get up out of bed.  He was actually doing pretty  well.  Vitals were stable.  Hemoglobin looked good at 13.7.  Started his  total hip protocol.  Placed to partial weightbearing.   By day #2, he started to get up with a  little bit more therapy.  PCA and  IV's were discontinued.  Dressing was changed.  Incision was healing well.   By day #3, he was doing much better.  He was seen in rounds by Dr. Despina Hick.  He had been getting up with physical therapy.  He did complain of some right  ankle pain.  He had some tenderness anterolateral.  He felt he had a right  ankle strain.  He was placed into an ASO for this.  Otherwise, doing well  and then was discharged home.   DISCHARGE PLAN:  1.  Patient was discharged home on June 01, 2004.  2.  Discharge diagnoses:  Please see above.  3.  Diet:  Resume previous home diet.  4.  Activity:  He is partial weightbearing, left lower extremity, 25-50%.      Gait-training ambulation and ADLs as per home health PT/OT and home      health nursing.  Total hip protocol.  5.  Follow up in two weeks from surgery.   DISCHARGE MEDICATIONS:  Percocet, Robaxin, Coumadin.   DISPOSITION:  Home.   CONDITION ON DISCHARGE:  Improved.      ALP/MEDQ  D:  07/10/2004  T:  07/11/2004  Job:  [id number]   cc:   Cassell Clement, M.D.  [address]  [place]  [place] [address]  Fax:  [phone]

## 2010-10-04 NOTE — Consult Note (Signed)
[facility]  Patient:    [name] [name], [name] [name] Visit Number: [id number] MRN: [id number]          Service Type: EMS Location: ED Attending Physician:  Shelba Flake Proc. Date: 01/04/01 Adm. Date:  01/04/2001 Disc. Date: 01/05/2001                            Consultation Report  PHYSICIAN REQUESTING CONSULTATION:  Dr. Earlyne Iba.  REASON FOR CONSULTATION:  [name] is a right-hand-dominant 75 year old male who sustained a fall at work today and sustained an injury to his right wrist with a chief complaint of pain and deformity.  He is an otherwise healthy 75 year old male with no known drug allergies, no recent hospitalizations or surgery.  PAST MEDICAL HISTORY:  Noncontributory.  SOCIAL HISTORY:  Noncontributory.  MEDICATIONS:  He is currently taking no significant medications.  PHYSICAL EXAMINATION:  GENERAL:  Well-developed, well-nourished male, pleasant, alert and oriented x 3.  EXTREMITIES:  On examination of his left upper extremity, he has an obvious deformity in the hand and wrist area.  He is neurovascularly intact.  He has full function of the radial, median and ulnar nerves, 2+ radial pulse, brisk refill.  Skin is intact.  There is no erythema, drainage or signs of infection elsewhere noted.  He does have significant deformity with a dorsal displacement apparently of the hand on the wrist area with significant dorsally.  X-RAY FINDINGS:  X-rays in the emergency department show a 100% displaced distal radius fracture with dorsal displacement and two large intra-articular fragments.  IMPRESSION:  A 75 year old male with a displaced intra-articular fracture of the left distal radius.  DESCRIPTION OF PROCEDURE:  Patient was given a 2% plain lidocaine hematoma block, placed in fingertrap traction with 10 pounds of countertraction across the radiocarpal joint and after 10 minutes of ligamentotaxis, a closed reduction was  performed.  Patient was then placed in a well-padded sugar-tong splint.  Post-reduction film showed good reduction in both the AP and lateral plane.  DISPOSITION:  Patient was discharged from the emergency department with Vicodin for pain, #30, my card for followup in the next 5 to 7 days.  We also discussed the need for operative intervention should his reduction displace. Patient will follow up again in five to seven days. Attending Physician:  Shelba Flake DD:  01/04/01 TD:  01/05/01 Job: [id number]

## 2010-10-04 NOTE — H&P (Signed)
NAME:  [name] [name], [name] [name]                  ACCOUNT NO.:  [id number]   MEDICAL RECORD NO.:  [id number]          PATIENT TYPE:  INP   LOCATION:  NA                           FACILITY:  [facility]   PHYSICIAN:  Ollen Gross, M.D.    DATE OF BIRTH:  Sep 22, 1935   DATE OF ADMISSION:  05/29/2004  DATE OF DISCHARGE:                                HISTORY & PHYSICAL   CHIEF COMPLAINT:  Left hip pain.   HISTORY OF PRESENT ILLNESS:  [name] is a 75 year old is a 75 year old male  who has a greater than 1-2 year history of significantly worsening pain in  his left hip.  The hip has previously been treated with intra-articular  cortisone injections.  The pain has gotten progressively worse where it is  hurting him all the time including having night pain.  It has lead to  significant functional limitations and difficulty doing activities of daily  living.  He is at a stage now where the hip is essentially taking over his  life.  We had previously discussed a possible hip replacement surgery.  He  has elected to pursue left total hip arthroplasty after a long discussion  about the procedure, risks, potential complications, and rehab course.   PAST MEDICAL HISTORY:  Significant for palpitations with a history of PVCs.  He had a full cardiac workup in 2001 and was recently cleared medically by  Dr. Cassell Clement for the upcoming surgery.   PAST SURGICAL HISTORY:  A partial colectomy, left ankle open reduction and  internal fixation, radiofrequency ablation to his right ventricle ten years  ago.   CURRENT MEDICATIONS:  Celebrex and potassium.   ALLERGIES:  No known drug allergies.   PHYSICAL EXAMINATION:  GENERAL:  Very pleasant well developed male, alert and oriented, in no  apparent distress.  VITAL SIGNS:  Temperature 97.9, pulse 80, respirations 16, blood pressure  140/80.  HEENT:  Within normal limits.  Cranial nerves 2-12 intact.  LUNGS:  Clear to auscultation bilaterally.  HEART:  Regular  rate and rhythm with no murmurs, gallops, and rubs.  ABDOMEN:  Nontender, nondistended, positive bowel sounds, no organomegaly.  GENITOURINARY:  Exam deferred.  Evaluation of his right hip shows normal range of motion with no discomfort.  Left hip shows flexion to approximately 100, no internal rotation, 10  degrees external rotation, 20 degrees abduction.  He walks with an antalgic  gait pattern.  Preoperative radiograph shows significant bone on bone  arthritis of the left hip with marginal osteophyte formation.  It has  advanced compared to previous radiographs.   ASSESSMENT:  Left hip arthritis.  [name] has end stage arthritis with  intractable pain.  At this point, the most predictable means of improving  pain and function would be a total hip arthroplasty.  He elects to undergo  that operation on Wednesday, January 11.  We had discussed bearing surfaces  and we will go with metal on metal given his young age and high activity  level.  We will inform Dr. Patty Sermons upon admission.     Drenda Freeze  FA/MEDQ  D:  05/23/2004  T:  05/23/2004  Job:  [id number]   cc:   Cassell Clement, M.D.  [address]  [place]  [place] [address]  Fax: [phone]

## 2010-10-04 NOTE — Op Note (Signed)
[facility]  Patient:    [name] [name], [name] [name] Visit Number: [id number] MRN: [id number]          Service Type: DSU Location: [facility] Attending Physician:  Marlowe Shores Proc. Date: 01/06/01 Adm. Date:  01/06/2001                             Operative Report  PREOPERATIVE DIAGNOSIS: Displaced intra-articular left distal radius fracture.  POSTOPERATIVE DIAGNOSIS: Displaced intra-articular left distal radius fracture.  OPERATION/PROCEDURE: Open reduction and internal fixation of left distal radius fracture.  SURGEON: Artist Pais. Mina Marble, M.D.  ASSISTANT: Junius Roads. Ireton, P.A.C.  ANESTHESIA: Axillary block with general supplemental.  TOURNIQUET TIME: One hour and 41 minutes.  COMPLICATIONS: None.  DRAINS: None.  DESCRIPTION OF PROCEDURE: The patient was taken to the operating room and after the induction of adequate general anesthesia the left upper extremity was prepped and draped in the usual sterile fashion.  An Esmarch was used to exsanguinate the limb and the tourniquet was inflated to 250 mm Hg.  At this point in time a longitudinal incision was made over the flexor carpi radialis tendon sheath for an incision length of 7 cm.  The incision was taken down through the skin until the FCR sheath was identified and this was split longitudinally and the FCR retracted to the midline.  The radial artery was retracted to the lateral side and the interval between these two was developed until the pronator quadratus muscle was identified.  The pronator quadratus muscle had been ruptured off its insertion on the radius and this was gently elevated off subperiosteally to expose a displaced intra-articular distal radius fracture.  The fracture had a large radial styloid fragment and a large volar fragment, and a linear facet fragment.  The fracture fragment was debrided of clot and reduction was performed.  Intraoperative x-ray showed good reduction  in both the AP and lateral plane.  A DVR plate was then placed on the volar aspect of the radius and secured with a 14 mm screw.  Initial placement of the second-most ulnar threaded peg on the T portion of the plate revealed the screw placement to have gone into the fracture site and caused displacement of the radial styloid and facets.  At this point in time the plate and pegs were removed and the plate was repositioned a few mm more distally and radially.  At this point in time intraoperative x-ray showed good reduction in both the AP and lateral plane.  The T plate and proximal stem were then filled with the appropriate threaded pegs and screws and intraoperative x-ray showed good reduction in both the AP and lateral plane. The wound was then thoroughly irrigated and closed with a running 3-0 Prolene subcuticular stitch after gentle reapproximation of the pronator quadratus. The wound was then dressed with Xeroform, 4 x 4s, fluffs, and a compressive dressing as well as sugar-tong splint with the forearm and wrist in neutral position.  The patient tolerated the procedure well and went to the recovery room in stable fashion. Attending Physician:  Marlowe Shores DD:  01/06/01 TD:  01/07/01 Job: [id number]

## 2010-12-04 ENCOUNTER — Telehealth: Payer: Self-pay | Admitting: Cardiology

## 2010-12-04 NOTE — Telephone Encounter (Signed)
LAST OV NOTE,EKG AND ANY CARDIAC TESTING

## 2010-12-09 ENCOUNTER — Ambulatory Visit (HOSPITAL_BASED_OUTPATIENT_CLINIC_OR_DEPARTMENT_OTHER)
Admission: RE | Admit: 2010-12-09 | Discharge: 2010-12-10 | Disposition: A | Discharge status: Home / Self Care | Payer: Medicare Other | Source: Ambulatory Visit | Attending: Urology | Admitting: Urology

## 2010-12-09 ENCOUNTER — Other Ambulatory Visit: Payer: Self-pay | Admitting: Urology

## 2010-12-09 DIAGNOSIS — N401 Enlarged prostate with lower urinary tract symptoms: Secondary | ICD-10-CM | POA: Insufficient documentation

## 2010-12-09 DIAGNOSIS — Z01812 Encounter for preprocedural laboratory examination: Secondary | ICD-10-CM | POA: Insufficient documentation

## 2010-12-09 DIAGNOSIS — N138 Other obstructive and reflux uropathy: Secondary | ICD-10-CM | POA: Insufficient documentation

## 2010-12-09 DIAGNOSIS — Z01810 Encounter for preprocedural cardiovascular examination: Secondary | ICD-10-CM | POA: Insufficient documentation

## 2010-12-09 LAB — POCT I-STAT 4, (NA,K, GLUC, HGB,HCT): Sodium: 140 mEq/L (ref 135–145)

## 2010-12-17 NOTE — Op Note (Signed)
NAME:  [name], [name]                  ACCOUNT NO.:  [id number]  MEDICAL RECORD NO.:  [id number]  LOCATION:                                 FACILITY:  PHYSICIAN:  [name] [name], M.D.  DATE OF BIRTH:  DATE OF PROCEDURE:  12/09/2010 DATE OF DISCHARGE:                              OPERATIVE REPORT   PREOPERATIVE DIAGNOSIS:  Benign prostatic hypertrophy.  POSTOPERATIVE DIAGNOSIS:  Benign prostatic hypertrophy.  SURGICAL PROCEDURES:  Transurethral resection of prostate with Gyrus.  SURGEON:  [name] [name], M.D.  ANESTHESIA:  General with LMA.  COMPLICATIONS:  None.  SPECIMEN:  Prostate chips to pathology.  DRAIN:  A 22-French three-way Foley catheter to CBI with normal saline.  BRIEF HISTORY:  The patient is a 75 year old male who was recently referred to me by [name] [name] who has recently retired for our practice.  The patient has BPH which is quite symptomatic, despite being on maximum medical therapy including Avodart and alfuzosin.  The patient has requested surgical management.  I have discussed TURP with the patient, as well as risks and complications of the procedure.  These include but not limited to bleeding, infection, urethral stricture/bladder neck contracture, and incontinence as well as mild ED. The patient understands these and desires to proceed.  DESCRIPTION OF PROCEDURE:  The patient was identified in the holding area.  He was taken to the operating room after IV Cipro was administered.  He was placed in the dorsal lithotomy position after general anesthetic was administered using LMA.  His perineum and genitalia were prepped and draped.  Time-out was then performed.  I then passed a 22 [place] cystoscope.  He had obstructing lateral lobes. The prostatic urethra was approximately 3 to 3.5 cm in length.  The left lobe overrode the right and caused this obstruction.  The bladder was entered and inspected circumferentially.  Both ureteral orifices  were identified and were normal in configuration and location.  There were no tumors, trabeculations or foreign bodies.  At this point, the cystoscope was removed and replaced with a 28 French resectoscope sheath.  The cutting loop was placed.  The resectoscope was hooked to saline and continuous irrigation.  I then resected most of the left lobe.  I used the loop for this.  The prostate was quite vascular.  Following resection of most of the left lobe, the button attachment was then placed on the Gyrus TURP equipment.  I then ablated the tissue using the vaporization.  Tissue was ablated circumferentially, down to the circular fibers of the surgical capsule of the prostate.  Once circumferential resection was achieved with vaporization, the entire prostatic fossa was then cauterized.  No bleeding was seen at this time. The small chips were evacuated from the bladder.  I then reinspected the prostatic fossa.  An incision was made in the midline from the bladder neck to the verumontanum which had been left intact.  With the irrigation off, no significant bleeding was seen.  At this time, the resectoscope was removed and a 22-French Foley catheter with three-way irrigation was placed and filled with 30 cc of water.  It was hooked up to CBI.  The patient tolerated the procedure well.  Prostatic chips were sent to Pathology.  He was awakened and then taken to PACU in stable condition.     [name] Millard. [name] [name], M.D.     SMD/MEDQ  D:  12/09/2010  T:  12/09/2010  Job:  [id number]  cc:   [name] [name], M.D. Fax: [phone]  Electronically Signed by [name] [name] M.D. on 12/17/2010 04:58:25 PM

## 2011-01-06 ENCOUNTER — Telehealth: Payer: Self-pay | Admitting: Cardiology

## 2011-01-06 ENCOUNTER — Encounter: Payer: Self-pay | Admitting: Nurse Practitioner

## 2011-01-06 NOTE — Telephone Encounter (Signed)
Okay to see Lawson Fiscal Since my schedule is so full.

## 2011-01-06 NOTE — Telephone Encounter (Signed)
Scheduled appointment to see [facility] tomorrow

## 2011-01-06 NOTE — Telephone Encounter (Signed)
Work in or offer appointment with Lawson Fiscal

## 2011-01-06 NOTE — Telephone Encounter (Signed)
Please call pt back about getting pt in for an OV within 2 weeks.  Pt states is going out of country and would like to be seen sooner.  Pt states has been light headed.  Pt states was a very personal friend of Dr. Patty Sermons and would call his personal phone if we could not get him in soon.

## 2011-01-07 ENCOUNTER — Encounter: Payer: Self-pay | Admitting: Nurse Practitioner

## 2011-01-07 ENCOUNTER — Ambulatory Visit (INDEPENDENT_AMBULATORY_CARE_PROVIDER_SITE_OTHER): Payer: Medicare Other | Admitting: Nurse Practitioner

## 2011-01-07 ENCOUNTER — Telehealth: Payer: Self-pay | Admitting: Cardiology

## 2011-01-07 VITALS — BP 130/82 | HR 76 | Wt 218.6 lb

## 2011-01-07 DIAGNOSIS — I10 Essential (primary) hypertension: Secondary | ICD-10-CM

## 2011-01-07 DIAGNOSIS — I498 Other specified cardiac arrhythmias: Secondary | ICD-10-CM

## 2011-01-07 DIAGNOSIS — R42 Dizziness and giddiness: Secondary | ICD-10-CM | POA: Insufficient documentation

## 2011-01-07 DIAGNOSIS — I471 Supraventricular tachycardia: Secondary | ICD-10-CM | POA: Insufficient documentation

## 2011-01-07 DIAGNOSIS — R55 Syncope and collapse: Secondary | ICD-10-CM

## 2011-01-07 LAB — CBC WITH DIFFERENTIAL/PLATELET
Basophils Absolute: 0 10*3/uL (ref 0.0–0.1)
Basophils Relative: 0.6 % (ref 0.0–3.0)
Eosinophils Absolute: 0.2 10*3/uL (ref 0.0–0.7)
Eosinophils Relative: 3.8 % (ref 0.0–5.0)
HCT: 45.5 % (ref 39.0–52.0)
Hemoglobin: 15.3 g/dL (ref 13.0–17.0)
Lymphocytes Relative: 29.8 % (ref 12.0–46.0)
Lymphs Abs: 1.9 10*3/uL (ref 0.7–4.0)
MCHC: 33.6 g/dL (ref 30.0–36.0)
MCV: 93.4 fl (ref 78.0–100.0)
Monocytes Absolute: 0.9 10*3/uL (ref 0.1–1.0)
Monocytes Relative: 13.7 % — ABNORMAL HIGH (ref 3.0–12.0)
Neutro Abs: 3.4 10*3/uL (ref 1.4–7.7)
Neutrophils Relative %: 52.1 % (ref 43.0–77.0)
Platelets: 179 10*3/uL (ref 150.0–400.0)
RBC: 4.87 Mil/uL (ref 4.22–5.81)
RDW: 13.9 % (ref 11.5–14.6)
WBC: 6.5 10*3/uL (ref 4.5–10.5)

## 2011-01-07 LAB — BASIC METABOLIC PANEL
BUN: 23 mg/dL (ref 6–23)
CO2: 31 mEq/L (ref 19–32)
Calcium: 9.5 mg/dL (ref 8.4–10.5)
Chloride: 102 mEq/L (ref 96–112)
Creatinine, Ser: 1.4 mg/dL (ref 0.4–1.5)
GFR: 54.58 mL/min — ABNORMAL LOW (ref >60.00)
Glucose, Bld: 93 mg/dL (ref 70–99)
Potassium: 4.8 mEq/L (ref 3.5–5.1)
Sodium: 140 mEq/L (ref 135–145)

## 2011-01-07 LAB — TSH: TSH: 1.24 u[IU]/mL (ref 0.35–5.50)

## 2011-01-07 NOTE — Assessment & Plan Note (Signed)
Has had remote ablation for PVC's in the past. Has seen EP about 9 years ago. Will see what the monitor shows.

## 2011-01-07 NOTE — Assessment & Plan Note (Signed)
He is having more spells. We will place an event monitor and see if we can "catch" one of his spells. For now, no change in his medicines. I have asked him to cut back on his alcohol. We will recheck his labs today. He will see Dr. Patty Sermons in 6 weeks. The patient will be leaving for [place] in about 3 1/2 weeks and will return the monitor prior to his departure. Patient is agreeable to this plan and will call if any problems develop in the interim.

## 2011-01-07 NOTE — Patient Instructions (Signed)
I want you to get a new blood pressure cuff to check your readings at home. Get an Omron We are going to put a monitor on you for the next month to see what is making you almost pass out We are going to check your labs today. Call for any problems.

## 2011-01-07 NOTE — Assessment & Plan Note (Signed)
His wrist cuff does not correlate. He will obtain a new cuff and monitor blood pressure at home.

## 2011-01-07 NOTE — Progress Notes (Signed)
[name] [name] Date of Birth: Oct 01, 1935   History of Present Illness: [name] [name] is seen today for a work in visit. He is seen for Dr. Patty Sermons. He is complaining of almost passing out spells. He has a chronic and long standing history of PVC's and has had remote ablation. He has seen Dr. Graciela Husbands in the remote past. He has been on Metoprolol chronically. He presents with increasing frequency of his spells. Where it was just once or twice a year, now it is once to twice a month. He does not lose consciousness. He has warning that it is coming. No chest pain. He will almost faint. It has occurred after eating heavy meals and after walking. He had a recent TURP about one month ago and was told that his heart rate was in the 30's to 40's during recovery. He remains on metoprolol. He forgets to take his potassium. Last potassium check that I see was 3.8. He notes that alcohol will sometimes be a trigger as well.   Current Outpatient Prescriptions on File Prior to Visit  Medication Sig Dispense Refill  . celecoxib (CELEBREX) 200 MG capsule Take 200 mg by mouth as needed.        . Cholecalciferol (VITAMIN D PO) Take 1,000 mg by mouth daily.        . metoprolol (TOPROL-XL) 50 MG 24 hr tablet Take 50 mg by mouth daily.        Marland Kitchen POTASSIUM PO Take 10 mEq by mouth 3 (three) times daily.        Marland Kitchen telmisartan-hydrochlorothiazide (MICARDIS HCT) 80-12.5 MG per tablet Take 1 tablet by mouth daily.        . triazolam (HALCION) 0.25 MG tablet Take 0.25 mg by mouth at bedtime as needed.        Marland Kitchen DISCONTD: lansoprazole (PREVACID) 30 MG capsule TAKE ONE CAPSULE EACH DAY                                                  GENERIC FOR PREVACID  30 capsule  5    Allergies  Allergen Reactions  . Altace     Cough    Past Medical History  Diagnosis Date  . Hypertension   . History of paroxysmal supraventricular tachycardia   . Vertigo, benign positional   . Palpitations     History of ventricular ectopy. He is  status post radiofrequency ablation at the [facility] in the early 90's. for PVC's  . Kidney stones   . BPH (benign prostatic hyperplasia)     s/p TURP in July 2012    Past Surgical History  Procedure Date  . Hernia repair 1990  . Ankle surgery   . Tonsillectomy Age 30  . Kidney stone surgery   . US echocardiography 06-20-2009    Est EF 55-60%    History  Smoking status  . Never Smoker   Smokeless tobacco  . Not on file    History  Alcohol Use  . Yes    Drink Wine almost daily    History reviewed. No pertinent family history.  Review of Systems: The review of systems is as above.  All other systems were reviewed and are negative.  Physical Exam: BP 130/82  Pulse 76  Wt 218 lb 9.6 oz (99.156 kg) Patient is pleasant and in  no acute distress. Skin is warm and dry. Color is normal.  HEENT is unremarkable. Normocephalic/atraumatic. PERRL. Sclera are nonicteric. Neck is supple. No masses. No JVD. Lungs are clear. Cardiac exam shows a regular rate and rhythm. He has an occasional ectopic. Abdomen is obese but soft. Extremities are without edema. Gait and ROM are intact. No gross neurologic deficits noted.   LABORATORY DATA:   Assessment / Plan:

## 2011-01-07 NOTE — Telephone Encounter (Signed)
No note necessary for this encounter.

## 2011-01-08 ENCOUNTER — Telehealth: Payer: Self-pay | Admitting: *Deleted

## 2011-01-08 NOTE — Telephone Encounter (Signed)
Message copied by Eugenia Pancoast on Wed Jan 08, 2011 10:29 AM ------      Message from: Rosalio Macadamia      Created: Wed Jan 08, 2011  7:58 AM       Ok to report. Labs are satisfactory. Will see what the monitor shows.

## 2011-01-08 NOTE — Telephone Encounter (Signed)
Advised patient of labs 

## 2011-01-10 ENCOUNTER — Ambulatory Visit: Payer: PRIVATE HEALTH INSURANCE | Admitting: Nurse Practitioner

## 2011-01-22 ENCOUNTER — Telehealth: Payer: Self-pay | Admitting: Cardiology

## 2011-01-22 NOTE — Telephone Encounter (Signed)
Pt states went to the beach this weekend with heart monitor on.  He states he called the monitoring company to let them know it wasn't reading and they stated it may not be secure due to the stickies being in the ocean water.  Pt states they are sending him Next Day Shipping more stickies and batteries for the device.  Pt would like to know if we have any stickies available that he can pick up today. He states he is afraid if he had another episode it wouldn't be caught if he can't wear the monitor today.  Please call pt if we have any stickies he can pick up today.

## 2011-01-22 NOTE — Telephone Encounter (Signed)
Left message, ok to come by and pick up electrodes

## 2011-02-14 LAB — HEMOGLOBIN AND HEMATOCRIT, BLOOD: Hemoglobin: 15.4

## 2011-02-14 LAB — BASIC METABOLIC PANEL
BUN: 20
CO2: 26
Calcium: 8.9
Chloride: 108
Creatinine, Ser: 1.23

## 2011-02-21 ENCOUNTER — Ambulatory Visit (INDEPENDENT_AMBULATORY_CARE_PROVIDER_SITE_OTHER): Payer: Medicare Other | Admitting: Cardiology

## 2011-02-21 ENCOUNTER — Encounter: Payer: Self-pay | Admitting: Cardiology

## 2011-02-21 VITALS — BP 118/70 | HR 60 | Ht 72.0 in | Wt 220.0 lb

## 2011-02-21 DIAGNOSIS — I10 Essential (primary) hypertension: Secondary | ICD-10-CM

## 2011-02-21 DIAGNOSIS — I498 Other specified cardiac arrhythmias: Secondary | ICD-10-CM

## 2011-02-21 DIAGNOSIS — I119 Hypertensive heart disease without heart failure: Secondary | ICD-10-CM

## 2011-02-21 DIAGNOSIS — I471 Supraventricular tachycardia: Secondary | ICD-10-CM

## 2011-02-21 DIAGNOSIS — R42 Dizziness and giddiness: Secondary | ICD-10-CM

## 2011-02-21 DIAGNOSIS — K219 Gastro-esophageal reflux disease without esophagitis: Secondary | ICD-10-CM

## 2011-02-21 NOTE — Assessment & Plan Note (Signed)
The patient has had only one additional spell of SVT since we last saw him.  This subsided spontaneously.

## 2011-02-21 NOTE — Assessment & Plan Note (Signed)
His blood pressure has been remaining stable, and normal on the present current therapy

## 2011-02-21 NOTE — Patient Instructions (Signed)
continue same dose of medications  Continue careful diet and exercise.  Watch alcohol intake.  Your physician wants you to follow-up in:4 months You will receive a reminder letter in the mail two months in advance. If you don't receive a letter, please call our office to schedule the follow-up appointment.

## 2011-02-21 NOTE — Progress Notes (Signed)
[name] [name] Date of Birth:  [birth year] [facility] [address]   [address] [place], [place]  [address] [phone]           Fax   [phone]  HPI: This pleasant 75 year old gentleman is seen for a scheduled followup office visit.  He has a history of paroxysmal supraventricular tachycardia.  Since he was last in our office.  He had one additional episode.  This occurred when he was in [place], [place], and it occurred after walking up a hill  after having a big lunch meal and having a glass of wine.  He states that in retrospect all of the episodes that he is experiencing have been a combination of a large meal, with alcohol, and exertion, such as walking long distance or up a hill.  He does not have any history of ischemic heart disease.  He had a normal nuclear stress test on 10/15/06 had an echocardiogram on 06/20/09 showing mild LVH with normal systolic function with impaired relaxation, mild aortic sclerosis, and trace mitral regurgitation, with no mitral stenosis, and normal pulmonary artery pressure.  She had a cardiac catheterization on 02/11/2000, which showed essentially normal coronaries with minimal irregularities and normal left ventricular function with no abnormal regional wall motion abnormalities.  Current Outpatient Prescriptions  Medication Sig Dispense Refill  . celecoxib (CELEBREX) 200 MG capsule Take 200 mg by mouth as needed.        . Cholecalciferol (VITAMIN D PO) Take 1,000 mg by mouth daily.        . lansoprazole (PREVACID) 30 MG capsule As needed      . metoprolol (TOPROL-XL) 50 MG 24 hr tablet Take 50 mg by mouth daily.        Marland Kitchen POTASSIUM PO Take 10 mEq by mouth 3 (three) times daily.        Marland Kitchen telmisartan-hydrochlorothiazide (MICARDIS HCT) 80-12.5 MG per tablet Take 1 tablet by mouth daily.        . triazolam (HALCION) 0.25 MG tablet Take 0.25 mg by mouth at bedtime as needed.          Allergies  Allergen Reactions  . Altace    Cough    Patient Active Problem List  Diagnoses  . Dizziness  . HTN (hypertension)  . SVT (supraventricular tachycardia)    History  Smoking status  . Never Smoker   Smokeless tobacco  . Not on file    History  Alcohol Use  . Yes    Drink Wine almost daily    No family history on file.  Review of Systems: The patient denies any heat or cold intolerance.  No weight gain or weight loss.  The patient denies headaches or blurry vision.  There is no cough or sputum production.  The patient denies dizziness.  There is no hematuria or hematochezia.  The patient denies any muscle aches or arthritis.  The patient denies any rash.  The patient denies frequent falling or instability.  There is no history of depression or anxiety.  All other systems were reviewed and are negative.   Physical Exam: Filed Vitals:   02/21/11 1046  BP: 118/70  Pulse: 60   Gen. appearance reveals a well-developed, well-nourished, gentleman in no distress.Pupils equal and reactive.   Extraocular Movements are full.  There is no scleral icterus.  The mouth and pharynx are normal.  The neck is supple.  The carotids reveal no bruits.  The jugular venous pressure is  normal.  The thyroid is not enlarged.  There is no lymphadenopathy.  The chest is clear to percussion and auscultation. There are no rales or rhonchi. Expansion of the chest is symmetrical.  The precordium is quiet.  The first heart sound is normal.  The second heart sound is physiologically split.  There is no murmur gallop rub or click.  There is no abnormal lift or heave.  The abdomen is soft and nontender. Bowel sounds are normal. The liver and spleen are not enlarged. There Are no abdominal masses. There are no bruits.  The pedal pulses are good.  There is no phlebitis or edema.  There is no cyanosis or clubbing. Strength is normal and symmetrical in all extremities.  There is no lateralizing weakness.  There are no sensory deficits.  The  skin is warm and dry.  There is no rash.    Assessment / Plan:  Continue same medication.  The patient will try to avoid the combination of a large meal and alcohol and particularly combining that with exertion.  Recheck in 4 months for followup office visit

## 2011-02-21 NOTE — Assessment & Plan Note (Signed)
The patient experiences occasional dizziness when he bends over to pick up a golf ball out of the cup.  He has to be sure to not stand up too quickly

## 2011-05-30 ENCOUNTER — Other Ambulatory Visit: Payer: Self-pay | Admitting: Cardiology

## 2011-06-02 DIAGNOSIS — H02139 Senile ectropion of unspecified eye, unspecified eyelid: Secondary | ICD-10-CM | POA: Diagnosis not present

## 2011-06-02 DIAGNOSIS — M2420 Disorder of ligament, unspecified site: Secondary | ICD-10-CM | POA: Diagnosis not present

## 2011-06-02 DIAGNOSIS — L908 Other atrophic disorders of skin: Secondary | ICD-10-CM | POA: Diagnosis not present

## 2011-06-02 DIAGNOSIS — L918 Other hypertrophic disorders of the skin: Secondary | ICD-10-CM | POA: Diagnosis not present

## 2011-06-02 DIAGNOSIS — H02839 Dermatochalasis of unspecified eye, unspecified eyelid: Secondary | ICD-10-CM | POA: Diagnosis not present

## 2011-06-02 DIAGNOSIS — H11439 Conjunctival hyperemia, unspecified eye: Secondary | ICD-10-CM | POA: Diagnosis not present

## 2011-06-24 ENCOUNTER — Other Ambulatory Visit (INDEPENDENT_AMBULATORY_CARE_PROVIDER_SITE_OTHER): Payer: Medicare Other | Admitting: *Deleted

## 2011-06-24 ENCOUNTER — Ambulatory Visit (INDEPENDENT_AMBULATORY_CARE_PROVIDER_SITE_OTHER): Payer: Medicare Other | Admitting: Cardiology

## 2011-06-24 ENCOUNTER — Encounter: Payer: Self-pay | Admitting: Cardiology

## 2011-06-24 VITALS — BP 128/70 | HR 58 | Resp 18 | Ht 72.0 in | Wt 227.8 lb

## 2011-06-24 DIAGNOSIS — R42 Dizziness and giddiness: Secondary | ICD-10-CM

## 2011-06-24 DIAGNOSIS — I498 Other specified cardiac arrhythmias: Secondary | ICD-10-CM

## 2011-06-24 DIAGNOSIS — I119 Hypertensive heart disease without heart failure: Secondary | ICD-10-CM

## 2011-06-24 DIAGNOSIS — I471 Supraventricular tachycardia: Secondary | ICD-10-CM | POA: Diagnosis not present

## 2011-06-24 DIAGNOSIS — M1990 Unspecified osteoarthritis, unspecified site: Secondary | ICD-10-CM | POA: Insufficient documentation

## 2011-06-24 LAB — BASIC METABOLIC PANEL
BUN: 21 mg/dL (ref 6–23)
CO2: 26 mEq/L (ref 19–32)
Chloride: 106 mEq/L (ref 96–112)
Creatinine, Ser: 1.3 mg/dL (ref 0.4–1.5)
GFR: 59 mL/min — ABNORMAL LOW (ref >60.00)

## 2011-06-24 NOTE — Patient Instructions (Signed)
Your physician recommends that you continue on your current medications as directed. Please refer to the Current Medication list given to you today.  Your physician recommends that you schedule a follow-up appointment in: 4 months  

## 2011-06-24 NOTE — Assessment & Plan Note (Signed)
The patient has occasional episodes of lightheadedness which are relieved by lying down for about an hour and resting.  His episodes usually occur in the mid to late afternoon.  They're not associated with any chest pain or shortness of breath or other symptoms.

## 2011-06-24 NOTE — Progress Notes (Signed)
[name] [name] Date of Birth:  1936-04-20 [facility] [address] [place], [place]  [address] [phone]  Fax   [phone]  HPI: This pleasant 76-year-old gentleman is seen for a scheduled followup office visit.  He is a past history of paroxysmal supraventricular tachycardia.  He does not have any history of ischemic heart disease.  He had a normal nuclear stress test in May 2008.  In coverage 2011 he had an echocardiogram showing mild LVH with normal systolic function and impaired relaxation as well as mild aortic sclerosis and trace mitral regurgitation.  In September 2001 he had a cardiac catheterization which showed essentially normal coronary arteries.  Current Outpatient Prescriptions  Medication Sig Dispense Refill  . celecoxib (CELEBREX) 200 MG capsule Take 200 mg by mouth as needed.        . Cholecalciferol (VITAMIN D PO) Take 1,000 mg by mouth daily.        . lansoprazole (PREVACID) 30 MG capsule As needed      . metoprolol (TOPROL-XL) 50 MG 24 hr tablet Take 50 mg by mouth daily.        Marland Kitchen POTASSIUM PO Take 10 mEq by mouth 3 (three) times daily.        Marland Kitchen telmisartan-hydrochlorothiazide (MICARDIS HCT) 80-12.5 MG per tablet Take 1 tablet by mouth daily.        . triazolam (HALCION) 0.25 MG tablet Take 0.25 mg by mouth at bedtime as needed.          Allergies  Allergen Reactions  . Altace     Cough    Patient Active Problem List  Diagnoses  . Dizziness  . HTN (hypertension)  . SVT (supraventricular tachycardia)    History  Smoking status  . Never Smoker   Smokeless tobacco  . Not on file    History  Alcohol Use  . Yes    Drink Wine almost daily    No family history on file.  Review of Systems: The patient denies any heat or cold intolerance.  No weight gain or weight loss.  The patient denies headaches or blurry vision.  There is no cough or sputum production.  The patient denies dizziness.  There is no hematuria or hematochezia.   The patient denies any muscle aches or arthritis.  The patient denies any rash.  The patient denies frequent falling or instability.  There is no history of depression or anxiety.  All other systems were reviewed and are negative.   Physical Exam: Filed Vitals:   06/24/11 0927  BP: 128/70  Pulse: 58  Resp: 18   the general appearance reveals a well-developed well-nourished gentleman in no distressPupils equal and reactive.   Extraocular Movements are full.  There is no scleral icterus.  The mouth and pharynx are normal.  The neck is supple.  The carotids reveal no bruits.  The jugular venous pressure is normal.  The thyroid is not enlarged.  There is no lymphadenopathy.  The chest is clear to percussion and auscultation. There are no rales or rhonchi. Expansion of the chest is symmetrical.  Heart reveals no murmur gallop rub or click.The abdomen is soft and nontender. Bowel sounds are normal. The liver and spleen are not enlarged. There Are no abdominal masses. There are no bruits.  Extremities show no phlebitis or edema.Strength is normal and symmetrical in all extremities.  There is no lateralizing weakness.  There are no sensory deficits.  The skin is warm and dry.  There is no rash.  EKG shows sinus bradycardia and incomplete right bundle branch block and inferolateral ST-T wave abnormalities similar to previous EKGs.    Assessment / Plan: Continue same medication.  Recheck in 4 months for followup office visit.  He needs to work on getting excess weight off.  He has gained 7 pounds since last visit.

## 2011-06-24 NOTE — Assessment & Plan Note (Signed)
The patient has not had any recurrent supraventricular tachycardia episodes since we last saw him.  He is avoiding caffeine and he is keeping alcohol intake to a low level.

## 2011-06-24 NOTE — Assessment & Plan Note (Signed)
The patient has some osteoarthritis of his knees.  He does take Celebrex on the weekends before he plays golf.  Tries to walk at work for  exercise

## 2011-06-25 ENCOUNTER — Telehealth: Payer: Self-pay | Admitting: *Deleted

## 2011-06-25 NOTE — Telephone Encounter (Signed)
Advised of labs 

## 2011-06-25 NOTE — Telephone Encounter (Signed)
Message copied by Burnell Blanks on Wed Jun 25, 2011  9:21 AM ------      Message from: Cassell Clement      Created: Wed Jun 25, 2011  6:07 AM       Please report.  The labs are stable.  Continue same meds.  Continue careful diet. Blood sugar is higher but may no have been fasting?  Watch carbs and calories, lose weight.

## 2011-07-14 DIAGNOSIS — N401 Enlarged prostate with lower urinary tract symptoms: Secondary | ICD-10-CM | POA: Diagnosis not present

## 2011-07-14 DIAGNOSIS — N5314 Retrograde ejaculation: Secondary | ICD-10-CM | POA: Diagnosis not present

## 2011-09-09 DIAGNOSIS — H0230 Blepharochalasis unspecified eye, unspecified eyelid: Secondary | ICD-10-CM | POA: Diagnosis not present

## 2011-09-09 DIAGNOSIS — H02409 Unspecified ptosis of unspecified eyelid: Secondary | ICD-10-CM | POA: Diagnosis not present

## 2011-09-18 DIAGNOSIS — M6530 Trigger finger, unspecified finger: Secondary | ICD-10-CM | POA: Diagnosis not present

## 2011-09-23 ENCOUNTER — Other Ambulatory Visit: Payer: Self-pay | Admitting: Cardiology

## 2011-10-08 DIAGNOSIS — M19079 Primary osteoarthritis, unspecified ankle and foot: Secondary | ICD-10-CM | POA: Diagnosis not present

## 2011-10-16 ENCOUNTER — Encounter: Payer: Self-pay | Admitting: Cardiology

## 2011-10-16 DIAGNOSIS — Z79899 Other long term (current) drug therapy: Secondary | ICD-10-CM | POA: Diagnosis not present

## 2011-10-16 DIAGNOSIS — E559 Vitamin D deficiency, unspecified: Secondary | ICD-10-CM | POA: Diagnosis not present

## 2011-10-16 DIAGNOSIS — G4762 Sleep related leg cramps: Secondary | ICD-10-CM | POA: Diagnosis not present

## 2011-10-16 DIAGNOSIS — M1710 Unilateral primary osteoarthritis, unspecified knee: Secondary | ICD-10-CM | POA: Diagnosis not present

## 2011-10-16 DIAGNOSIS — I10 Essential (primary) hypertension: Secondary | ICD-10-CM | POA: Diagnosis not present

## 2011-10-16 DIAGNOSIS — Z1331 Encounter for screening for depression: Secondary | ICD-10-CM | POA: Diagnosis not present

## 2011-10-16 DIAGNOSIS — Z0000 Encounter for general adult medical examination without abnormal findings: Secondary | ICD-10-CM | POA: Diagnosis not present

## 2011-10-16 DIAGNOSIS — K219 Gastro-esophageal reflux disease without esophagitis: Secondary | ICD-10-CM | POA: Diagnosis not present

## 2011-10-24 ENCOUNTER — Ambulatory Visit: Payer: Medicare Other | Admitting: Cardiology

## 2011-10-27 ENCOUNTER — Ambulatory Visit (INDEPENDENT_AMBULATORY_CARE_PROVIDER_SITE_OTHER): Payer: Medicare Other | Admitting: Cardiology

## 2011-10-27 ENCOUNTER — Encounter: Payer: Self-pay | Admitting: Cardiology

## 2011-10-27 VITALS — BP 110/70 | HR 60 | Ht 72.0 in | Wt 221.0 lb

## 2011-10-27 DIAGNOSIS — I498 Other specified cardiac arrhythmias: Secondary | ICD-10-CM | POA: Diagnosis not present

## 2011-10-27 DIAGNOSIS — R42 Dizziness and giddiness: Secondary | ICD-10-CM | POA: Diagnosis not present

## 2011-10-27 DIAGNOSIS — I119 Hypertensive heart disease without heart failure: Secondary | ICD-10-CM | POA: Diagnosis not present

## 2011-10-27 DIAGNOSIS — I471 Supraventricular tachycardia: Secondary | ICD-10-CM

## 2011-10-27 DIAGNOSIS — I10 Essential (primary) hypertension: Secondary | ICD-10-CM

## 2011-10-27 NOTE — Assessment & Plan Note (Signed)
The patient has been experiencing some occasional spells of dizziness and weakness when he plays golf in very hot humid weather.  He played last weekend and felt hot and noticed that his heart rate was 120.  He states that his playing partners were also having similar symptoms.  He did make it good effort to drink plenty of water while playing golf but he had the symptoms nonetheless.

## 2011-10-27 NOTE — Assessment & Plan Note (Signed)
The patient has not had any recurrence of his supraventricular tachycardia

## 2011-10-27 NOTE — Progress Notes (Signed)
[name] [name] Date of Birth:  [birth year] [facility] [address] [place], [place]  [address] [phone]  Fax   [phone]  HPI: This pleasant 76 year old gentleman is seen for a scheduled followup office visit.  He has a past history of essential hypertension and a past history of paroxysmal supraventricular tachycardia.  His other past history of dizziness.  He does not have any history of ischemic heart disease.  He had a cardiac catheterization in 2001 which showed normal coronary arteries.  He had a normal nuclear stress test in 2008.  In 2011 he had an echocardiogram showing mild LVH with normal systolic function and with impaired relaxation as well as mild aortic sclerosis and trace mitral regurgitation.  Current Outpatient Prescriptions  Medication Sig Dispense Refill  . celecoxib (CELEBREX) 200 MG capsule Take 200 mg by mouth as needed.        . Cholecalciferol (VITAMIN D PO) Take 1,000 mg by mouth daily.        . lansoprazole (PREVACID) 30 MG capsule As needed      . metoprolol succinate (TOPROL-XL) 50 MG 24 hr tablet TAKE ONE TABLET EVERY DAY                                                  Generic for TOPROL X  90 tablet  3  . POTASSIUM PO Take 10 mEq by mouth 3 (three) times daily.        Marland Kitchen telmisartan-hydrochlorothiazide (MICARDIS HCT) 80-12.5 MG per tablet       . triazolam (HALCION) 0.25 MG tablet Take 0.25 mg by mouth at bedtime as needed.        Marland Kitchen DISCONTD: MICARDIS HCT 80-12.5 MG per tablet TAKE ONE TABLET EVERY DAY  90 tablet  3    Allergies  Allergen Reactions  . Ramipril     Cough    Patient Active Problem List  Diagnoses  . Dizziness  . HTN (hypertension)  . SVT (supraventricular tachycardia)  . Osteoarthritis    History  Smoking status  . Never Smoker   Smokeless tobacco  . Not on file    History  Alcohol Use  . Yes    Drink Wine almost daily    No family history on file.  Review of Systems: The patient denies any  heat or cold intolerance.  No weight gain or weight loss.  The patient denies headaches or blurry vision.  There is no cough or sputum production.  The patient denies dizziness.  There is no hematuria or hematochezia.  The patient denies any muscle aches or arthritis.  The patient denies any rash.  The patient denies frequent falling or instability.  There is no history of depression or anxiety.  All other systems were reviewed and are negative.   Physical Exam: Filed Vitals:   10/27/11 1556  BP: 110/70  Pulse: 60   the general appearance reveals a well-developed well-nourished gentleman in no distress.The head and neck exam reveals pupils equal and reactive.  Extraocular movements are full.  There is no scleral icterus.  The mouth and pharynx are normal.  The neck is supple.  The carotids reveal no bruits.  The jugular venous pressure is normal.  The  thyroid is not enlarged.  There is no lymphadenopathy.  The chest is clear to percussion and  auscultation.  There are no rales or rhonchi.  Expansion of the chest is symmetrical.  The precordium is quiet.  The first heart sound is normal.  The second heart sound is physiologically split.  There is no murmur gallop rub or click.  There is no abnormal lift or heave.  The abdomen is soft and nontender.  The bowel sounds are normal.  The liver and spleen are not enlarged.  There are no abdominal masses.  There are no abdominal bruits.  Extremities reveal good pedal pulses.  There is no phlebitis or edema.  There is no cyanosis or clubbing.  Strength is normal and symmetrical in all extremities.  There is no lateralizing weakness.  There are no sensory deficits.  The skin is warm and dry.  There is no rash.      Assessment / Plan: Continue same medication except half the dose of Micronase HCT.  Recheck in 4 months for followup office visit and EKG.  I reviewed the electrocardiogram that he had at Dr. Kevan Ny' office dated 10/16/11 and it shows anterior wall  ST-T wave changes which are unchanged dating back to 2005.

## 2011-10-27 NOTE — Patient Instructions (Signed)
Decrease your Micardis to 1/2 tablet daily  You need to purchase an OMRON at home blood pressure cuff and monitor your blood pressure at home.  Your physician wants you to follow-up in: 4 months You will receive a reminder letter in the mail two months in advance. If you don't receive a letter, please call our office to schedule the follow-up appointment.

## 2011-10-27 NOTE — Assessment & Plan Note (Signed)
The patient has a history of high blood pressure but recently his pressure has been in the low normal range.  He is having some symptoms of orthostatic dizziness if he stands up from a stooped position.  His present blood pressure medicine may be too strong and we're going to try cutting back on his micardis HCT 80/12.5 down to just a half tablet daily and see if his symptoms improve

## 2011-10-29 ENCOUNTER — Other Ambulatory Visit: Payer: Self-pay | Admitting: Gastroenterology

## 2011-10-29 DIAGNOSIS — Z86010 Personal history of colonic polyps: Secondary | ICD-10-CM | POA: Diagnosis not present

## 2011-10-29 DIAGNOSIS — D126 Benign neoplasm of colon, unspecified: Secondary | ICD-10-CM | POA: Diagnosis not present

## 2011-10-29 DIAGNOSIS — Z09 Encounter for follow-up examination after completed treatment for conditions other than malignant neoplasm: Secondary | ICD-10-CM | POA: Diagnosis not present

## 2011-11-07 DIAGNOSIS — M25569 Pain in unspecified knee: Secondary | ICD-10-CM | POA: Diagnosis not present

## 2011-11-07 DIAGNOSIS — M1710 Unilateral primary osteoarthritis, unspecified knee: Secondary | ICD-10-CM | POA: Diagnosis not present

## 2011-12-02 DIAGNOSIS — R9431 Abnormal electrocardiogram [ECG] [EKG]: Secondary | ICD-10-CM | POA: Diagnosis not present

## 2011-12-02 DIAGNOSIS — I4949 Other premature depolarization: Secondary | ICD-10-CM | POA: Diagnosis not present

## 2011-12-02 DIAGNOSIS — I10 Essential (primary) hypertension: Secondary | ICD-10-CM | POA: Diagnosis not present

## 2011-12-10 DIAGNOSIS — Z7982 Long term (current) use of aspirin: Secondary | ICD-10-CM | POA: Diagnosis not present

## 2011-12-10 DIAGNOSIS — I10 Essential (primary) hypertension: Secondary | ICD-10-CM | POA: Diagnosis not present

## 2011-12-10 DIAGNOSIS — H0230 Blepharochalasis unspecified eye, unspecified eyelid: Secondary | ICD-10-CM | POA: Diagnosis not present

## 2011-12-10 DIAGNOSIS — H02409 Unspecified ptosis of unspecified eyelid: Secondary | ICD-10-CM | POA: Diagnosis not present

## 2011-12-10 DIAGNOSIS — K219 Gastro-esophageal reflux disease without esophagitis: Secondary | ICD-10-CM | POA: Diagnosis not present

## 2011-12-10 DIAGNOSIS — M129 Arthropathy, unspecified: Secondary | ICD-10-CM | POA: Diagnosis not present

## 2011-12-10 DIAGNOSIS — Z9849 Cataract extraction status, unspecified eye: Secondary | ICD-10-CM | POA: Diagnosis not present

## 2011-12-10 DIAGNOSIS — Z96649 Presence of unspecified artificial hip joint: Secondary | ICD-10-CM | POA: Diagnosis not present

## 2011-12-10 DIAGNOSIS — Z9079 Acquired absence of other genital organ(s): Secondary | ICD-10-CM | POA: Diagnosis not present

## 2011-12-16 DIAGNOSIS — Z9889 Other specified postprocedural states: Secondary | ICD-10-CM | POA: Diagnosis not present

## 2011-12-16 DIAGNOSIS — Z4889 Encounter for other specified surgical aftercare: Secondary | ICD-10-CM | POA: Diagnosis not present

## 2012-01-22 DIAGNOSIS — Z9889 Other specified postprocedural states: Secondary | ICD-10-CM | POA: Diagnosis not present

## 2012-01-22 DIAGNOSIS — Z4889 Encounter for other specified surgical aftercare: Secondary | ICD-10-CM | POA: Diagnosis not present

## 2012-01-28 DIAGNOSIS — M79609 Pain in unspecified limb: Secondary | ICD-10-CM | POA: Diagnosis not present

## 2012-01-28 DIAGNOSIS — B351 Tinea unguium: Secondary | ICD-10-CM | POA: Diagnosis not present

## 2012-02-02 ENCOUNTER — Other Ambulatory Visit: Payer: Self-pay | Admitting: Cardiology

## 2012-02-02 NOTE — Telephone Encounter (Signed)
Refilled generic prevacid

## 2012-02-13 DIAGNOSIS — M25569 Pain in unspecified knee: Secondary | ICD-10-CM | POA: Diagnosis not present

## 2012-02-18 DIAGNOSIS — M1710 Unilateral primary osteoarthritis, unspecified knee: Secondary | ICD-10-CM | POA: Diagnosis not present

## 2012-03-02 ENCOUNTER — Encounter: Payer: Self-pay | Admitting: Cardiology

## 2012-03-02 ENCOUNTER — Ambulatory Visit (INDEPENDENT_AMBULATORY_CARE_PROVIDER_SITE_OTHER): Payer: Medicare Other | Admitting: Cardiology

## 2012-03-02 VITALS — BP 140/68 | HR 55 | Resp 18 | Ht 72.0 in | Wt 222.0 lb

## 2012-03-02 DIAGNOSIS — I10 Essential (primary) hypertension: Secondary | ICD-10-CM | POA: Diagnosis not present

## 2012-03-02 DIAGNOSIS — I471 Supraventricular tachycardia, unspecified: Secondary | ICD-10-CM

## 2012-03-02 DIAGNOSIS — I498 Other specified cardiac arrhythmias: Secondary | ICD-10-CM

## 2012-03-02 NOTE — Progress Notes (Signed)
[name] [name] Date of Birth:  22-Jan-1936 [facility] [address] [place], [place]  [address] [phone]  Fax   [phone]  HPI:  This pleasant 76 year old gentleman is seen for a scheduled followup office visit. He has a past history of essential hypertension and a past history of paroxysmal supraventricular tachycardia. His other past history of dizziness. He does not have any history of ischemic heart disease. He had a cardiac catheterization in 2001 which showed normal coronary arteries. He had a normal nuclear stress test in 2008. In 2011 he had an echocardiogram showing mild LVH with normal systolic function and with impaired relaxation as well as mild aortic sclerosis and trace mitral regurgitation.  Since last visit he has been doing well.  Current Outpatient Prescriptions  Medication Sig Dispense Refill  . celecoxib (CELEBREX) 200 MG capsule Take 200 mg by mouth as needed.        . Cholecalciferol (VITAMIN D PO) Take 1,000 mg by mouth daily.        . lansoprazole (PREVACID) 30 MG capsule As needed      . metoprolol succinate (TOPROL-XL) 50 MG 24 hr tablet TAKE ONE TABLET EVERY DAY                                                  Generic for TOPROL X  90 tablet  3  . POTASSIUM PO Take 10 mEq by mouth 3 (three) times daily.        Marland Kitchen telmisartan-hydrochlorothiazide (MICARDIS HCT) 80-12.5 MG per tablet Take 1 tablet by mouth daily.       . triazolam (HALCION) 0.25 MG tablet Take 0.25 mg by mouth at bedtime as needed.        Marland Kitchen DISCONTD: lansoprazole (PREVACID) 30 MG capsule TAKE ONE CAPSULE EACH DAY  30 capsule  11    Allergies  Allergen Reactions  . Ramipril     Cough    Patient Active Problem List  Diagnosis  . Dizziness  . HTN (hypertension)  . SVT (supraventricular tachycardia)  . Osteoarthritis    History  Smoking status  . Never Smoker   Smokeless tobacco  . Not on file    History  Alcohol Use  . Yes    Drink Wine almost daily     No family history on file.  Review of Systems: The patient denies any heat or cold intolerance.  No weight gain or weight loss.  The patient denies headaches or blurry vision.  There is no cough or sputum production.  The patient denies dizziness.  There is no hematuria or hematochezia.  The patient denies any muscle aches or arthritis.  The patient denies any rash.  The patient denies frequent falling or instability.  There is no history of depression or anxiety.  All other systems were reviewed and are negative.   Physical Exam: Filed Vitals:   03/02/12 1000  BP: 140/68  Pulse: 55  Resp: 18   the general appearance reveals a well-developed well-nourished gentleman in no distress.The head and neck exam reveals pupils equal and reactive.  Extraocular movements are full.  There is no scleral icterus.  The mouth and pharynx are normal.  The neck is supple.  The carotids reveal no bruits.  The jugular venous pressure is normal.  The  thyroid is not enlarged.  There is no lymphadenopathy.  The chest is clear to percussion and auscultation.  There are no rales or rhonchi.  Expansion of the chest is symmetrical.  The precordium is quiet.  The first heart sound is normal.  The second heart sound is physiologically split.  There is no murmur gallop rub or click.  There is no abnormal lift or heave.  The abdomen is soft and nontender.  The bowel sounds are normal.  The liver and spleen are not enlarged.  There are no abdominal masses.  There are no abdominal bruits.  Extremities reveal good pedal pulses.  There is no phlebitis or edema.  There is no cyanosis or clubbing.  Strength is normal and symmetrical in all extremities.  There is no lateralizing weakness.  There are no sensory deficits.  The skin is warm and dry.  There is no rash.  EKG today shows sinus bradycardia and anterior T-wave changes which have improved since 06/24/11   Assessment / Plan: Continue same medication.  Recheck in 4  months.

## 2012-03-02 NOTE — Patient Instructions (Addendum)
Work harder on weight loss and increase your exercise  Your physician recommends that you continue on your current medications as directed. Please refer to the Current Medication list given to you today.  Your physician recommends that you schedule a follow-up appointment in: 4 months

## 2012-03-02 NOTE — Assessment & Plan Note (Signed)
The patient has not experienced any recurrent episodes of SVT

## 2012-03-02 NOTE — Assessment & Plan Note (Signed)
The patient purchased a home blood pressure machine after his last visit and he reports that his blood pressures at home are within normal range

## 2012-03-03 DIAGNOSIS — N200 Calculus of kidney: Secondary | ICD-10-CM | POA: Diagnosis not present

## 2012-03-22 DIAGNOSIS — Z23 Encounter for immunization: Secondary | ICD-10-CM | POA: Diagnosis not present

## 2012-03-30 ENCOUNTER — Other Ambulatory Visit: Payer: Self-pay | Admitting: Dermatology

## 2012-03-30 DIAGNOSIS — L819 Disorder of pigmentation, unspecified: Secondary | ICD-10-CM | POA: Diagnosis not present

## 2012-03-30 DIAGNOSIS — L570 Actinic keratosis: Secondary | ICD-10-CM | POA: Diagnosis not present

## 2012-03-30 DIAGNOSIS — L578 Other skin changes due to chronic exposure to nonionizing radiation: Secondary | ICD-10-CM | POA: Diagnosis not present

## 2012-03-30 DIAGNOSIS — L821 Other seborrheic keratosis: Secondary | ICD-10-CM | POA: Diagnosis not present

## 2012-03-30 DIAGNOSIS — C44721 Squamous cell carcinoma of skin of unspecified lower limb, including hip: Secondary | ICD-10-CM | POA: Diagnosis not present

## 2012-03-30 DIAGNOSIS — D485 Neoplasm of uncertain behavior of skin: Secondary | ICD-10-CM | POA: Diagnosis not present

## 2012-03-30 DIAGNOSIS — D1801 Hemangioma of skin and subcutaneous tissue: Secondary | ICD-10-CM | POA: Diagnosis not present

## 2012-05-03 DIAGNOSIS — B999 Unspecified infectious disease: Secondary | ICD-10-CM | POA: Diagnosis not present

## 2012-05-03 DIAGNOSIS — L089 Local infection of the skin and subcutaneous tissue, unspecified: Secondary | ICD-10-CM | POA: Diagnosis not present

## 2012-05-28 DIAGNOSIS — M1710 Unilateral primary osteoarthritis, unspecified knee: Secondary | ICD-10-CM | POA: Diagnosis not present

## 2012-06-09 DIAGNOSIS — L600 Ingrowing nail: Secondary | ICD-10-CM | POA: Diagnosis not present

## 2012-06-25 DIAGNOSIS — M1710 Unilateral primary osteoarthritis, unspecified knee: Secondary | ICD-10-CM | POA: Diagnosis not present

## 2012-06-30 ENCOUNTER — Ambulatory Visit: Payer: Medicare Other | Admitting: Cardiology

## 2012-07-15 ENCOUNTER — Encounter: Payer: Self-pay | Admitting: Cardiology

## 2012-07-15 ENCOUNTER — Ambulatory Visit (INDEPENDENT_AMBULATORY_CARE_PROVIDER_SITE_OTHER): Payer: Medicare Other | Admitting: Cardiology

## 2012-07-15 VITALS — BP 126/76 | HR 63 | Ht 72.0 in | Wt 230.1 lb

## 2012-07-15 DIAGNOSIS — I471 Supraventricular tachycardia: Secondary | ICD-10-CM | POA: Diagnosis not present

## 2012-07-15 DIAGNOSIS — I119 Hypertensive heart disease without heart failure: Secondary | ICD-10-CM | POA: Diagnosis not present

## 2012-07-15 DIAGNOSIS — I498 Other specified cardiac arrhythmias: Secondary | ICD-10-CM | POA: Diagnosis not present

## 2012-07-15 NOTE — Patient Instructions (Addendum)
You are ok for surgery, a note will be sent to your doctor  Your physician recommends that you continue on your current medications as directed. Please refer to the Current Medication list given to you today.  Your physician recommends that you schedule a follow-up appointment in: 4 months

## 2012-07-15 NOTE — Assessment & Plan Note (Signed)
The patient has not had any recent episodes of SVT

## 2012-07-15 NOTE — Progress Notes (Signed)
Sharl Ma Date of Birth:  02-20-1936 [facility] [address] [place], [place]  [address] [phone]  Fax   [phone]  HPI: This pleasant 77 year old gentleman is seen for a followup office visit.  He has a past history of essential hypertension and a past history of paroxysmal supraventricular tachycardia.  He does not have any history of ischemic heart disease.  He had a cardiac catheterization in 2001 showing normal coronary arteries he had a normal nuclear stress test in 2008.  He had an echocardiogram in 2011 which showed mild LVH with normal systolic function and with impaired relaxation.  He has mild aortic sclerosis by echo as well as trace mitral regurgitation.  Current Outpatient Prescriptions  Medication Sig Dispense Refill  . aspirin 81 MG tablet Take 81 mg by mouth daily.      . celecoxib (CELEBREX) 200 MG capsule Take 200 mg by mouth as needed.        . Cholecalciferol (VITAMIN D PO) Take 1,000 mg by mouth daily.        . lansoprazole (PREVACID) 30 MG capsule As needed      . metoprolol succinate (TOPROL-XL) 50 MG 24 hr tablet TAKE ONE TABLET EVERY DAY                                                  Generic for TOPROL X  90 tablet  3  . POTASSIUM PO Take 10 mEq by mouth 3 (three) times daily.        Marland Kitchen telmisartan-hydrochlorothiazide (MICARDIS HCT) 80-12.5 MG per tablet Take 1 tablet by mouth daily.        No current facility-administered medications for this visit.    Allergies  Allergen Reactions  . Ramipril     Cough    Patient Active Problem List  Diagnosis  . Dizziness  . HTN (hypertension)  . SVT (supraventricular tachycardia)  . Osteoarthritis  . Benign hypertensive heart disease without heart failure  . PSVT (paroxysmal supraventricular tachycardia)    History  Smoking status  . Never Smoker   Smokeless tobacco  . Not on file    History  Alcohol Use  . Yes    Comment: Drink Wine almost daily    No family history  on file.  Review of Systems: The patient denies any heat or cold intolerance.  No weight gain or weight loss.  The patient denies headaches or blurry vision.  There is no cough or sputum production.  The patient denies dizziness.  There is no hematuria or hematochezia.  The patient denies any muscle aches or arthritis.  The patient denies any rash.  The patient denies frequent falling or instability.  There is no history of depression or anxiety.  All other systems were reviewed and are negative.   Physical Exam: Filed Vitals:   07/15/12 0932  BP: 126/76  Pulse: 63   the general appearance reveals a well-developed well-nourished gentleman in no distress.The head and neck exam reveals pupils equal and reactive.  Extraocular movements are full.  There is no scleral icterus.  The mouth and pharynx are normal.  The neck is supple.  The carotids reveal no bruits.  The jugular venous pressure is normal.  The  thyroid is not enlarged.  There is no lymphadenopathy.  The chest is clear to percussion  and auscultation.  There are no rales or rhonchi.  Expansion of the chest is symmetrical.  The precordium is quiet.  The first heart sound is normal.  The second heart sound is physiologically split.  There is no murmur gallop rub or click.  There is no abnormal lift or heave.  The abdomen is soft and nontender.  The bowel sounds are normal.  The liver and spleen are not enlarged.  There are no abdominal masses.  There are no abdominal bruits.  Extremities reveal good pedal pulses.  There is no phlebitis or edema.  There is no cyanosis or clubbing.  Strength is normal and symmetrical in all extremities.  There is no lateralizing weakness.  There are no sensory deficits.  The skin is warm and dry.  There is no rash.   EKG today shows normal sinus rhythm and incomplete right bundle branch block and chronic T-wave changes which are unchanged from prior tracings.   Assessment / Plan: The patient is cleared for his  upcoming bilateral knee surgery scheduled for April.  We will plan to see him back in 4 months for followup office visit.  Continue same medication

## 2012-07-15 NOTE — Assessment & Plan Note (Signed)
Blood pressure was remaining stable on current therapy 

## 2012-07-16 DIAGNOSIS — N200 Calculus of kidney: Secondary | ICD-10-CM | POA: Diagnosis not present

## 2012-07-16 DIAGNOSIS — N401 Enlarged prostate with lower urinary tract symptoms: Secondary | ICD-10-CM | POA: Diagnosis not present

## 2012-07-16 DIAGNOSIS — R3129 Other microscopic hematuria: Secondary | ICD-10-CM | POA: Diagnosis not present

## 2012-07-16 DIAGNOSIS — N529 Male erectile dysfunction, unspecified: Secondary | ICD-10-CM | POA: Diagnosis not present

## 2012-08-02 ENCOUNTER — Other Ambulatory Visit: Payer: Self-pay | Admitting: Dermatology

## 2012-08-02 DIAGNOSIS — D485 Neoplasm of uncertain behavior of skin: Secondary | ICD-10-CM | POA: Diagnosis not present

## 2012-08-02 DIAGNOSIS — D1801 Hemangioma of skin and subcutaneous tissue: Secondary | ICD-10-CM | POA: Diagnosis not present

## 2012-08-02 DIAGNOSIS — Z85828 Personal history of other malignant neoplasm of skin: Secondary | ICD-10-CM | POA: Diagnosis not present

## 2012-08-02 DIAGNOSIS — L570 Actinic keratosis: Secondary | ICD-10-CM | POA: Diagnosis not present

## 2012-08-24 ENCOUNTER — Other Ambulatory Visit: Payer: Self-pay | Admitting: *Deleted

## 2012-08-24 MED ORDER — METOPROLOL SUCCINATE ER 50 MG PO TB24: 50.0000 mg | ORAL_TABLET | Freq: Every day | ORAL | Status: DC

## 2012-08-24 NOTE — Telephone Encounter (Signed)
Fax Received. Refill Completed. [name] [name] (R.M.A)   

## 2012-08-25 DIAGNOSIS — M1710 Unilateral primary osteoarthritis, unspecified knee: Secondary | ICD-10-CM | POA: Diagnosis not present

## 2012-08-25 DIAGNOSIS — M25569 Pain in unspecified knee: Secondary | ICD-10-CM | POA: Diagnosis not present

## 2012-09-01 NOTE — H&P (Signed)
TOTAL KNEE ADMISSION H&P  Patient is being admitted for bilateral total knee arthroplasties.  Subjective:  Chief Complaint:  Bilateral knee OA / pain.  HPI: [name] [name], 77 y.o. male, has a history of pain and functional disability in the bilaterally knee due to arthritis and has failed non-surgical conservative treatments for greater than 12 weeks to includeNSAID's and/or analgesics, corticosteriod injections and activity modification.  Onset of symptoms was gradual, starting 2 years ago with rapidlly worsening course since that time. The patient noted prior procedures on the knee to include  arthroscopy on the left knee(s).  Patient currently rates pain in the bilaterally knee(s) at 8 out of 10 with activity. Patient has worsening of pain with activity and weight bearing, pain that interferes with activities of daily living, pain with passive range of motion, crepitus and joint swelling.  Patient has evidence of periarticular osteophytes and joint space narrowing by imaging studies. There is no active infection.  Risks, benefits and expectations were discussed with the patient. Patient understand the risks, benefits and expectations and wishes to proceed with surgery.   D/C Plans:   SNF - [facility]  Post-op Meds:    No Rx given   Tranexamic Acid:   Not to be given - CAD  Decadron:    To be given  FYI:   Xarelto for 2 weeks the ASA  Patient Active Problem List   Diagnosis Date Noted  . Benign hypertensive heart disease without heart failure 07/15/2012  . PSVT (paroxysmal supraventricular tachycardia) 07/15/2012  . Osteoarthritis 06/24/2011  . Dizziness 01/07/2011  . HTN (hypertension) 01/07/2011  . SVT (supraventricular tachycardia) 01/07/2011   Past Medical History  Diagnosis Date  . Hypertension   . History of paroxysmal supraventricular tachycardia   . Vertigo, benign positional   . Palpitations     History of ventricular ectopy. He is status post radiofrequency ablation at the  [facility] in the early 90's. for PVC's  . Kidney stones   . BPH (benign prostatic hyperplasia)     s/p TURP in July 2012    Past Surgical History  Procedure Laterality Date  . Hernia repair  1990  . Ankle surgery    . Tonsillectomy  Age 83  . Kidney stone surgery    . US echocardiography  06-20-2009    Est EF 55-60%    No prescriptions prior to admission   Allergies  Allergen Reactions  . Ramipril     Cough    History  Substance Use Topics  . Smoking status: Never Smoker   . Smokeless tobacco: Not on file  . Alcohol Use: Yes     Comment: Drink Wine almost daily      Review of Systems  Constitutional: Negative.   HENT: Negative.   Eyes: Negative.   Respiratory: Negative.   Cardiovascular: Positive for leg swelling.  Gastrointestinal: Negative.   Genitourinary: Negative.   Musculoskeletal: Positive for back pain and joint pain.  Skin: Negative.   Neurological: Negative.   Endo/Heme/Allergies: Negative.   Psychiatric/Behavioral: Negative.     Objective:  Physical Exam  Constitutional: He is oriented to person, place, and time. He appears well-developed and well-nourished.  HENT:  Head: Normocephalic and atraumatic.  Mouth/Throat: Oropharynx is clear and moist.  Eyes: Pupils are equal, round, and reactive to light.  Neck: Neck supple. No JVD present. No tracheal deviation present. No thyromegaly present.  Cardiovascular: Normal rate and intact distal pulses.  An irregular rhythm present.  Respiratory: Effort normal  and breath sounds normal. No stridor. No respiratory distress. He has no wheezes.  GI: Soft. There is no tenderness. There is no guarding.  Musculoskeletal:       Right knee: He exhibits decreased range of motion, swelling and bony tenderness. He exhibits no effusion, no ecchymosis, no deformity, no laceration and no erythema. Tenderness found.       Left knee: He exhibits decreased range of motion, swelling and bony tenderness. He exhibits no  effusion, no ecchymosis, no deformity, no laceration and no erythema. Tenderness found.  Lymphadenopathy:    He has no cervical adenopathy.  Neurological: He is alert and oriented to person, place, and time.  Skin: Skin is warm and dry.  Psychiatric: He has a normal mood and affect.    Labs:  Estimated body mass index is 30.10 kg/(m^2) as calculated from the following:   Height as of 03/02/12: 6' (1.829 m).   Weight as of 03/02/12: 100.699 kg (222 lb).   Imaging Review Plain radiographs demonstrate severe degenerative joint disease of the bilaterally knee(s). The overall alignment isneutral. The bone quality appears to be good for age and reported activity level.  Assessment/Plan:  End stage arthritis, bilaterally knee   The patient history, physical examination, clinical judgment of the provider and imaging studies are consistent with end stage degenerative joint disease of the bilaterally knee(s) and total knee arthroplasty is deemed medically necessary. The treatment options including medical management, injection therapy arthroscopy and arthroplasty were discussed at length. The risks and benefits of total knee arthroplasty were presented and reviewed. The risks due to aseptic loosening, infection, stiffness, patella tracking problems, thromboembolic complications and other imponderables were discussed. The patient acknowledged the explanation, agreed to proceed with the plan and consent was signed. Patient is being admitted for inpatient treatment for surgery, pain control, PT, OT, prophylactic antibiotics, VTE prophylaxis, progressive ambulation and ADL's and discharge planning. The patient is planning to be discharged to skilled nursing facility.    Anastasio Auerbach Shabrea Weldin   PAC  09/01/2012, 7:49 PM

## 2012-09-08 ENCOUNTER — Ambulatory Visit (HOSPITAL_COMMUNITY)
Admission: RE | Admit: 2012-09-08 | Discharge: 2012-09-08 | Disposition: A | Discharge status: Home / Self Care | Payer: Medicare Other | Source: Ambulatory Visit | Attending: Orthopedic Surgery | Admitting: Orthopedic Surgery

## 2012-09-08 ENCOUNTER — Encounter (HOSPITAL_COMMUNITY): Payer: Self-pay | Admitting: Pharmacy Technician

## 2012-09-08 ENCOUNTER — Encounter (HOSPITAL_COMMUNITY)
Admission: RE | Admit: 2012-09-08 | Discharge: 2012-09-08 | Disposition: A | Discharge status: Home / Self Care | Payer: Medicare Other | Source: Ambulatory Visit | Attending: Orthopedic Surgery | Admitting: Orthopedic Surgery

## 2012-09-08 ENCOUNTER — Encounter (HOSPITAL_COMMUNITY): Payer: Self-pay

## 2012-09-08 DIAGNOSIS — Z01812 Encounter for preprocedural laboratory examination: Secondary | ICD-10-CM | POA: Diagnosis not present

## 2012-09-08 DIAGNOSIS — I10 Essential (primary) hypertension: Secondary | ICD-10-CM | POA: Diagnosis not present

## 2012-09-08 DIAGNOSIS — M1990 Unspecified osteoarthritis, unspecified site: Secondary | ICD-10-CM | POA: Diagnosis not present

## 2012-09-08 DIAGNOSIS — Z01818 Encounter for other preprocedural examination: Secondary | ICD-10-CM | POA: Diagnosis not present

## 2012-09-08 HISTORY — DX: Gastro-esophageal reflux disease without esophagitis: K21.9

## 2012-09-08 HISTORY — DX: Reserved for inherently not codable concepts without codable children: IMO0001

## 2012-09-08 HISTORY — DX: Abnormal electrocardiogram (ECG) (EKG): R94.31

## 2012-09-08 HISTORY — DX: Adverse effect of unspecified anesthetic, initial encounter: T41.45XA

## 2012-09-08 HISTORY — DX: Unspecified osteoarthritis, unspecified site: M19.90

## 2012-09-08 HISTORY — DX: Other complications of anesthesia, initial encounter: T88.59XA

## 2012-09-08 LAB — URINALYSIS, ROUTINE W REFLEX MICROSCOPIC
Bilirubin Urine: NEGATIVE
Ketones, ur: NEGATIVE mg/dL
Leukocytes, UA: NEGATIVE
Nitrite: NEGATIVE
Protein, ur: NEGATIVE mg/dL
pH: 5 (ref 5.0–8.0)

## 2012-09-08 LAB — CBC
HCT: 46.4 % (ref 39.0–52.0)
MCHC: 34.9 g/dL (ref 30.0–36.0)
Platelets: 176 10*3/uL (ref 150–400)
RBC: 5.11 MIL/uL (ref 4.22–5.81)
RDW: 13.3 % (ref 11.5–15.5)

## 2012-09-08 LAB — BASIC METABOLIC PANEL
CO2: 26 mEq/L (ref 19–32)
Glucose, Bld: 93 mg/dL (ref 70–99)
Potassium: 4.3 mEq/L (ref 3.5–5.1)
Sodium: 138 mEq/L (ref 135–145)

## 2012-09-08 LAB — PROTIME-INR: INR: 1.01 (ref 0.00–1.49)

## 2012-09-08 IMAGING — CR DG CHEST 2V
2 series · 2 of 2 positions shown · non-contrast
Comparison: [DATE]

CLINICAL DATA: Preop total the replacement.

CHEST - 2 VIEW

[w chest pa]
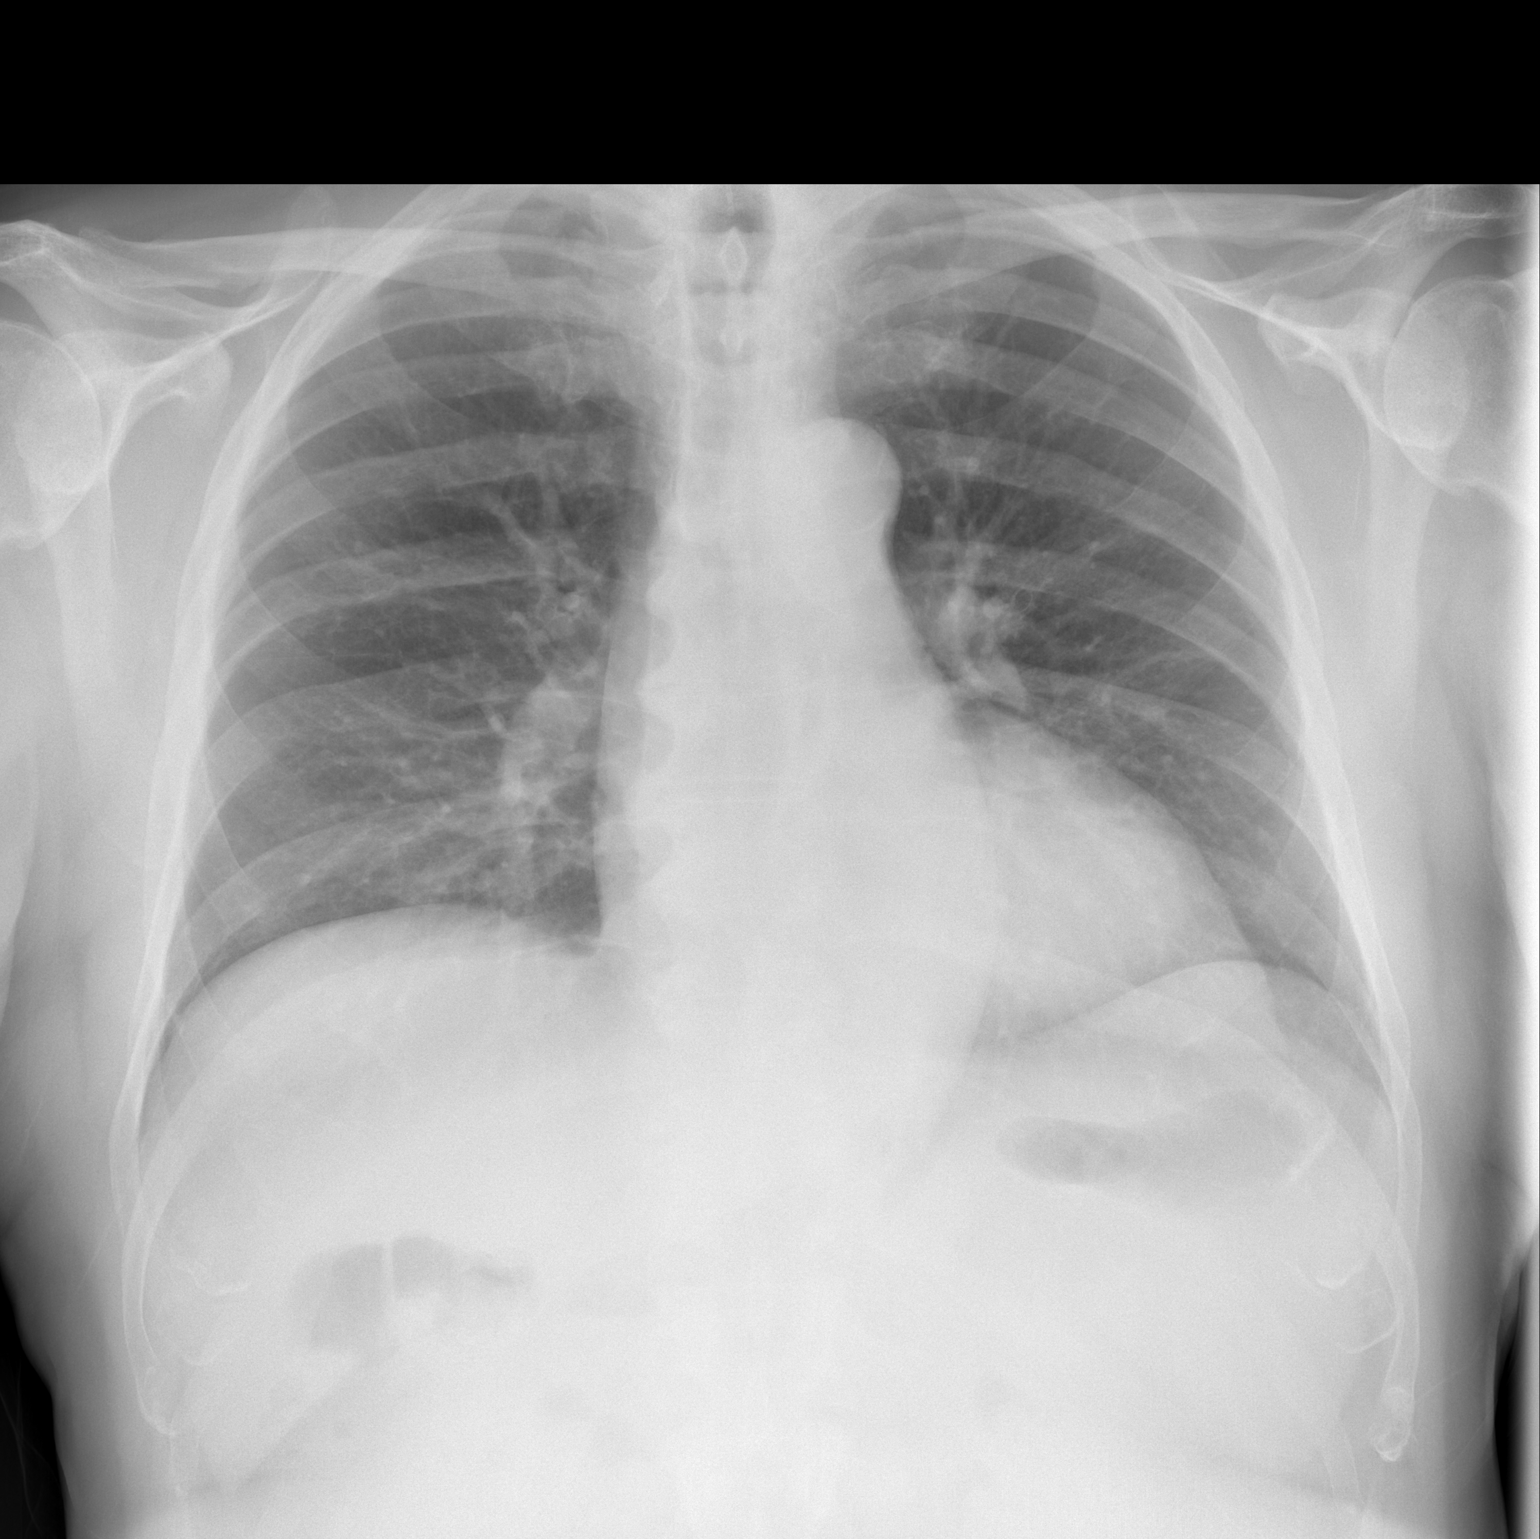

[w chest lat]
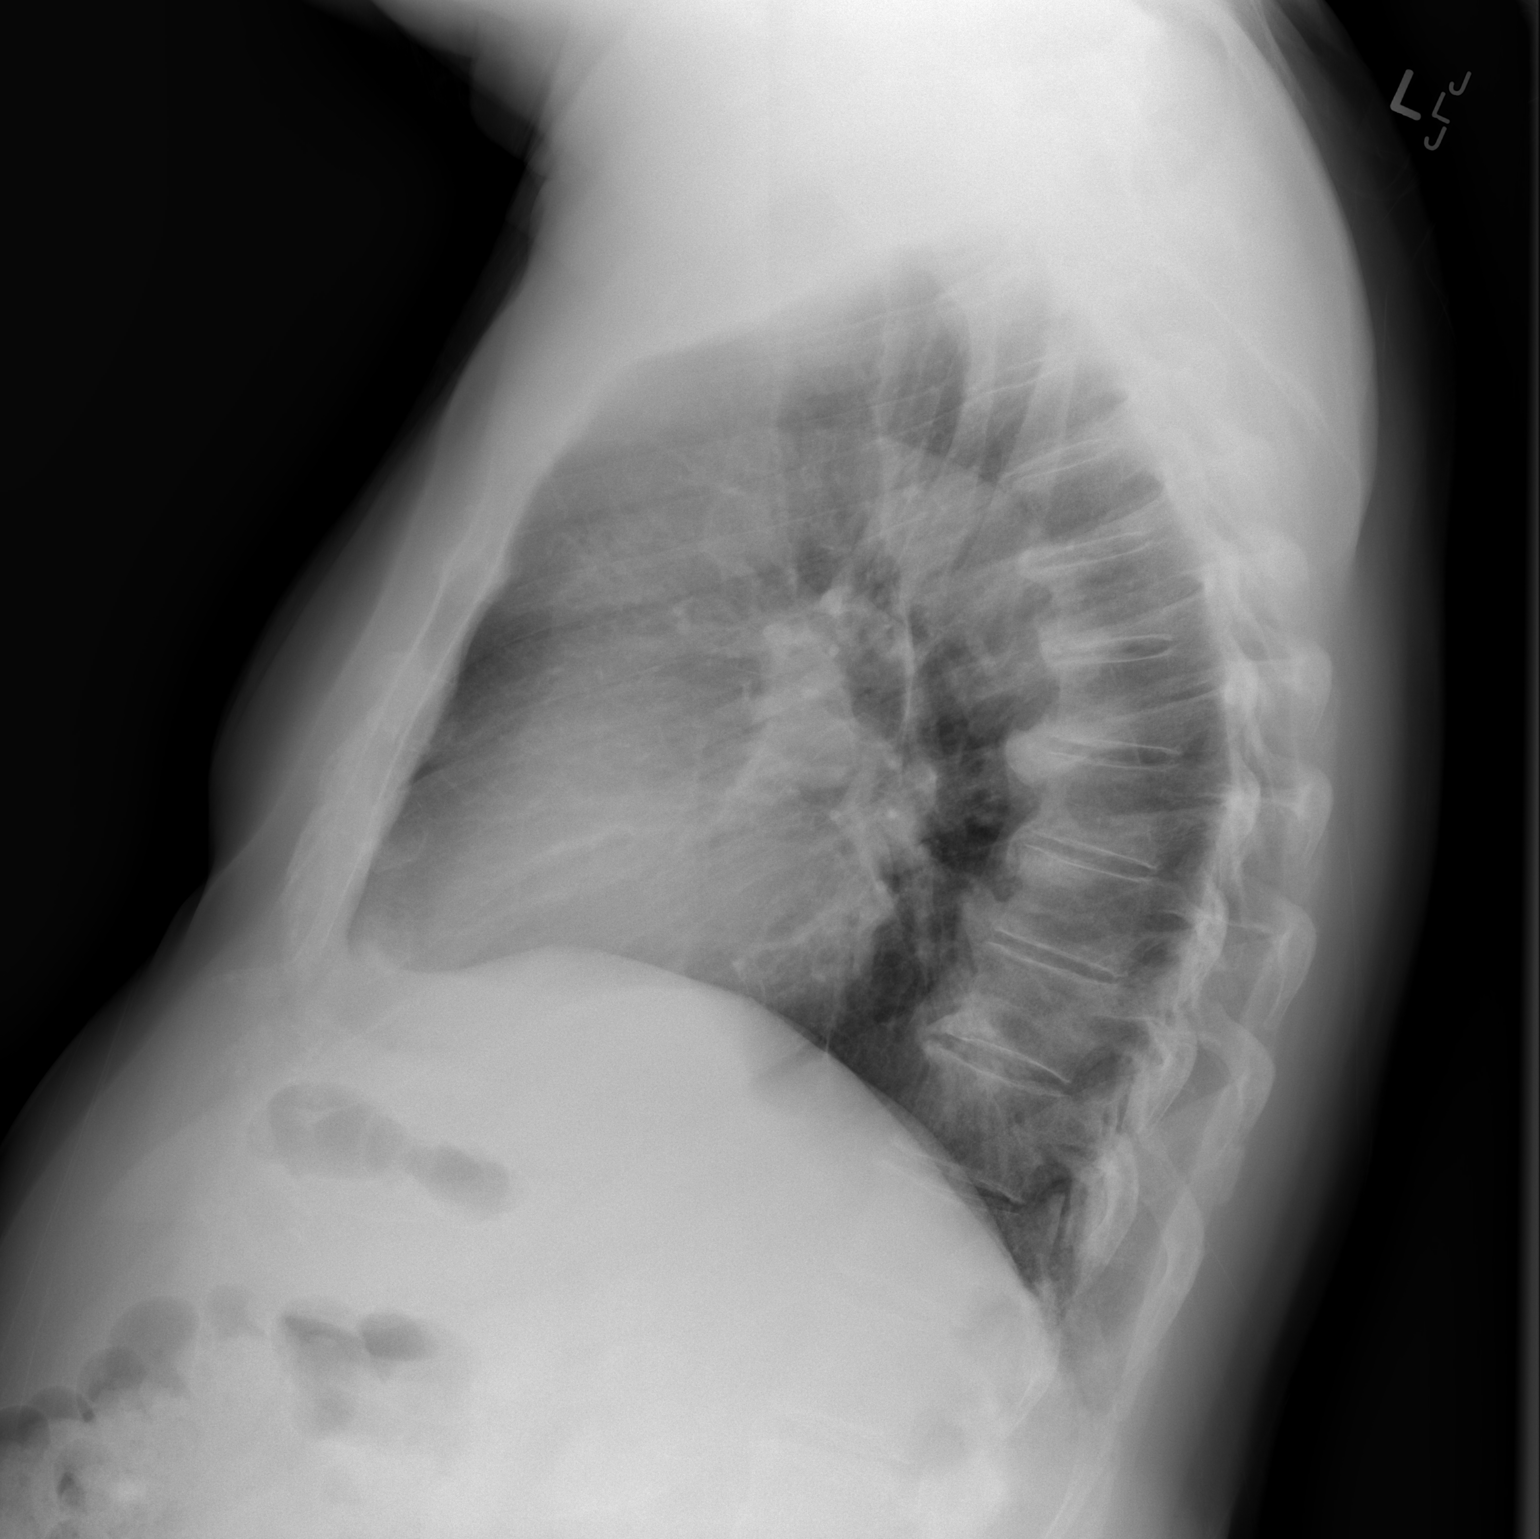

[2 of 2 positions shown; findings below may reference images not displayed]

FINDINGS: Normal mediastinum and cardiac silhouette.  Normal
pulmonary  vasculature.  No evidence of effusion, infiltrate, or
pneumothorax.  No acute bony abnormality.. Degenerative
osteophytosis of the thoracic spine..
IMPRESSION: No acute cardiopulmonary process.

## 2012-09-08 NOTE — Pre-Procedure Instructions (Signed)
PT'S PREOP BUN 25 - BMET REPORT FAXED TO DR. Nilsa Nutting OFFICE FOR REVIEW.

## 2012-09-08 NOTE — Patient Instructions (Signed)
YOUR SURGERY IS SCHEDULED AT [facility]  ON:  Monday  4/28  REPORT TO [place] [facility] AT:  7:30 AM      PHONE # FOR SHORT STAY IS [phone]  DO NOT EAT OR DRINK ANYTHING AFTER MIDNIGHT THE NIGHT BEFORE YOUR SURGERY.  YOU MAY BRUSH YOUR TEETH, RINSE OUT YOUR MOUTH--BUT NO WATER, NO FOOD, NO CHEWING GUM, NO MINTS, NO CANDIES, NO CHEWING TOBACCO.  PLEASE TAKE THE FOLLOWING MEDICATIONS THE AM OF YOUR SURGERY WITH A FEW SIPS OF WATER:  PREVACID AND METOPROLOL    DO NOT BRING VALUABLES, MONEY, CREDIT CARDS.  DO NOT WEAR JEWELRY, MAKE-UP, NAIL POLISH AND NO METAL PINS OR CLIPS IN YOUR HAIR. CONTACT LENS, DENTURES / PARTIALS, GLASSES SHOULD NOT BE WORN TO SURGERY AND IN MOST CASES-HEARING AIDS WILL NEED TO BE REMOVED.  BRING YOUR GLASSES CASE, ANY EQUIPMENT NEEDED FOR YOUR CONTACT LENS. FOR PATIENTS ADMITTED TO THE HOSPITAL--CHECK OUT TIME THE DAY OF DISCHARGE IS 11:00 AM.  ALL INPATIENT ROOMS ARE PRIVATE - WITH BATHROOM, TELEPHONE, TELEVISION AND WIFI INTERNET.                               PLEASE READ OVER ANY  FACT SHEETS THAT YOU WERE GIVEN: MRSA INFORMATION, BLOOD TRANSFUSION INFORMATION, INCENTIVE SPIROMETER INFORMATION. FAILURE TO FOLLOW THESE INSTRUCTIONS MAY RESULT IN THE CANCELLATION OF YOUR SURGERY.   PATIENT SIGNATURE_________________________________

## 2012-09-08 NOTE — Pre-Procedure Instructions (Signed)
EKG AND CARDIOLOGY OFFICE NOTE FROM DR. BRACKBILL 07/15/12 IN EPIC AND PT HAS CLEARANCE FOR SURGERY FROM DR. BRACKBILL AND DR.GATES. CXR WAS DONE TODAY PREOP AT [facility].

## 2012-09-13 ENCOUNTER — Inpatient Hospital Stay (HOSPITAL_COMMUNITY): Payer: Medicare Other | Admitting: Anesthesiology

## 2012-09-13 ENCOUNTER — Encounter (HOSPITAL_COMMUNITY): Payer: Self-pay | Admitting: *Deleted

## 2012-09-13 ENCOUNTER — Inpatient Hospital Stay (HOSPITAL_COMMUNITY)
Admission: RE | Admit: 2012-09-13 | Discharge: 2012-09-16 | DRG: 462 | Disposition: A | Discharge status: Skilled Nursing Facility | Payer: Medicare Other | Source: Ambulatory Visit | Attending: Orthopedic Surgery | Admitting: Orthopedic Surgery

## 2012-09-13 ENCOUNTER — Encounter (HOSPITAL_COMMUNITY): Payer: Self-pay | Admitting: Anesthesiology

## 2012-09-13 ENCOUNTER — Encounter (HOSPITAL_COMMUNITY)
Admission: RE | Disposition: A | Discharge status: Skilled Nursing Facility | Payer: Self-pay | Source: Ambulatory Visit | Attending: Orthopedic Surgery

## 2012-09-13 DIAGNOSIS — Z471 Aftercare following joint replacement surgery: Secondary | ICD-10-CM | POA: Diagnosis not present

## 2012-09-13 DIAGNOSIS — IMO0002 Reserved for concepts with insufficient information to code with codable children: Secondary | ICD-10-CM | POA: Diagnosis not present

## 2012-09-13 DIAGNOSIS — R55 Syncope and collapse: Secondary | ICD-10-CM | POA: Diagnosis not present

## 2012-09-13 DIAGNOSIS — R269 Unspecified abnormalities of gait and mobility: Secondary | ICD-10-CM | POA: Diagnosis not present

## 2012-09-13 DIAGNOSIS — Z6830 Body mass index (BMI) 30.0-30.9, adult: Secondary | ICD-10-CM

## 2012-09-13 DIAGNOSIS — I471 Supraventricular tachycardia, unspecified: Secondary | ICD-10-CM | POA: Diagnosis present

## 2012-09-13 DIAGNOSIS — N400 Enlarged prostate without lower urinary tract symptoms: Secondary | ICD-10-CM | POA: Diagnosis present

## 2012-09-13 DIAGNOSIS — I059 Rheumatic mitral valve disease, unspecified: Secondary | ICD-10-CM | POA: Diagnosis present

## 2012-09-13 DIAGNOSIS — Z87442 Personal history of urinary calculi: Secondary | ICD-10-CM

## 2012-09-13 DIAGNOSIS — S8990XA Unspecified injury of unspecified lower leg, initial encounter: Secondary | ICD-10-CM | POA: Diagnosis not present

## 2012-09-13 DIAGNOSIS — I119 Hypertensive heart disease without heart failure: Secondary | ICD-10-CM | POA: Diagnosis present

## 2012-09-13 DIAGNOSIS — Z96659 Presence of unspecified artificial knee joint: Secondary | ICD-10-CM

## 2012-09-13 DIAGNOSIS — M1710 Unilateral primary osteoarthritis, unspecified knee: Secondary | ICD-10-CM | POA: Diagnosis not present

## 2012-09-13 DIAGNOSIS — G8918 Other acute postprocedural pain: Secondary | ICD-10-CM | POA: Diagnosis not present

## 2012-09-13 DIAGNOSIS — K219 Gastro-esophageal reflux disease without esophagitis: Secondary | ICD-10-CM | POA: Diagnosis present

## 2012-09-13 DIAGNOSIS — E663 Overweight: Secondary | ICD-10-CM | POA: Diagnosis present

## 2012-09-13 DIAGNOSIS — D62 Acute posthemorrhagic anemia: Secondary | ICD-10-CM | POA: Diagnosis not present

## 2012-09-13 DIAGNOSIS — I498 Other specified cardiac arrhythmias: Secondary | ICD-10-CM | POA: Diagnosis present

## 2012-09-13 DIAGNOSIS — M6281 Muscle weakness (generalized): Secondary | ICD-10-CM | POA: Diagnosis not present

## 2012-09-13 DIAGNOSIS — I700 Atherosclerosis of aorta: Secondary | ICD-10-CM | POA: Diagnosis present

## 2012-09-13 DIAGNOSIS — D500 Iron deficiency anemia secondary to blood loss (chronic): Secondary | ICD-10-CM

## 2012-09-13 DIAGNOSIS — Z96653 Presence of artificial knee joint, bilateral: Secondary | ICD-10-CM

## 2012-09-13 DIAGNOSIS — I10 Essential (primary) hypertension: Secondary | ICD-10-CM | POA: Diagnosis not present

## 2012-09-13 DIAGNOSIS — M25569 Pain in unspecified knee: Secondary | ICD-10-CM | POA: Diagnosis not present

## 2012-09-13 HISTORY — PX: TOTAL KNEE ARTHROPLASTY: SHX125

## 2012-09-13 LAB — TYPE AND SCREEN
ABO/RH(D): B NEG
Antibody Screen: NEGATIVE

## 2012-09-13 SURGERY — ARTHROPLASTY, KNEE, BILATERAL, TOTAL
Anesthesia: General | Site: Knee | Laterality: Bilateral | Wound class: Clean

## 2012-09-13 MED ORDER — OXYCODONE HCL 5 MG/5ML PO SOLN: 5.0000 mg | Freq: Once | ORAL | Status: DC | PRN

## 2012-09-13 MED ORDER — DOCUSATE SODIUM 100 MG PO CAPS
100.0000 mg | ORAL_CAPSULE | Freq: Two times a day (BID) | ORAL | Status: DC
  Administered 2012-09-13 – 2012-09-16 (×6): 100 mg via ORAL

## 2012-09-13 MED ORDER — CEFAZOLIN SODIUM-DEXTROSE 2-3 GM-% IV SOLR
2.0000 g | INTRAVENOUS | Status: AC
  Administered 2012-09-13: 2 g via INTRAVENOUS

## 2012-09-13 MED ORDER — HYDROMORPHONE HCL PF 1 MG/ML IJ SOLN
0.2500 mg | INTRAMUSCULAR | Status: DC | PRN
  Administered 2012-09-13 (×2): 0.5 mg via INTRAVENOUS

## 2012-09-13 MED ORDER — CHLORHEXIDINE GLUCONATE 4 % EX LIQD: 60.0000 mL | Freq: Once | CUTANEOUS | Status: DC

## 2012-09-13 MED ORDER — NALOXONE HCL 0.4 MG/ML IJ SOLN: 0.4000 mg | INTRAMUSCULAR | Status: DC | PRN

## 2012-09-13 MED ORDER — MIDAZOLAM HCL 5 MG/5ML IJ SOLN
INTRAMUSCULAR | Status: DC | PRN
  Administered 2012-09-13: 2 mg via INTRAVENOUS

## 2012-09-13 MED ORDER — METOCLOPRAMIDE HCL 10 MG PO TABS: 5.0000 mg | ORAL_TABLET | Freq: Three times a day (TID) | ORAL | Status: DC | PRN

## 2012-09-13 MED ORDER — 0.9 % SODIUM CHLORIDE (POUR BTL) OPTIME
TOPICAL | Status: DC | PRN
  Administered 2012-09-13: 1000 mL

## 2012-09-13 MED ORDER — STERILE WATER FOR IRRIGATION IR SOLN
Status: DC | PRN
  Administered 2012-09-13: 3000 mL

## 2012-09-13 MED ORDER — ONDANSETRON HCL 4 MG PO TABS: 4.0000 mg | ORAL_TABLET | Freq: Four times a day (QID) | ORAL | Status: DC | PRN

## 2012-09-13 MED ORDER — BUPIVACAINE-EPINEPHRINE 0.25% -1:200000 IJ SOLN
INTRAMUSCULAR | Status: DC | PRN
  Administered 2012-09-13: 30 mL

## 2012-09-13 MED ORDER — SODIUM CHLORIDE 0.9 % IV SOLN
INTRAVENOUS | Status: DC
  Administered 2012-09-13 – 2012-09-14 (×2): via INTRAVENOUS
  Filled 2012-09-13 (×15): qty 1000

## 2012-09-13 MED ORDER — TELMISARTAN-HCTZ 80-12.5 MG PO TABS: 1.0000 | ORAL_TABLET | Freq: Every day | ORAL | Status: DC

## 2012-09-13 MED ORDER — BUPIVACAINE HCL (PF) 0.5 % IJ SOLN
INTRAMUSCULAR | Status: DC | PRN
  Administered 2012-09-13 (×2): 4 mL via EPIDURAL
  Administered 2012-09-13: 5 mL via EPIDURAL

## 2012-09-13 MED ORDER — PROPOFOL 10 MG/ML IV EMUL
INTRAVENOUS | Status: DC | PRN
  Administered 2012-09-13: 150 mg via INTRAVENOUS

## 2012-09-13 MED ORDER — DIPHENHYDRAMINE HCL 25 MG PO CAPS
25.0000 mg | ORAL_CAPSULE | ORAL | Status: DC | PRN
  Administered 2012-09-14 – 2012-09-16 (×2): 25 mg via ORAL
  Filled 2012-09-13 (×3): qty 1

## 2012-09-13 MED ORDER — IRBESARTAN 300 MG PO TABS
300.0000 mg | ORAL_TABLET | Freq: Every day | ORAL | Status: DC
  Administered 2012-09-14 – 2012-09-16 (×3): 300 mg via ORAL
  Filled 2012-09-13 (×3): qty 1

## 2012-09-13 MED ORDER — METOCLOPRAMIDE HCL 5 MG/ML IJ SOLN: 10.0000 mg | Freq: Three times a day (TID) | INTRAMUSCULAR | Status: DC | PRN

## 2012-09-13 MED ORDER — LIDOCAINE HCL (CARDIAC) 20 MG/ML IV SOLN
INTRAVENOUS | Status: DC | PRN
  Administered 2012-09-13: 60 mg via INTRAVENOUS

## 2012-09-13 MED ORDER — BISACODYL 10 MG RE SUPP: 10.0000 mg | Freq: Every day | RECTAL | Status: DC | PRN

## 2012-09-13 MED ORDER — CELECOXIB 200 MG PO CAPS
200.0000 mg | ORAL_CAPSULE | Freq: Two times a day (BID) | ORAL | Status: DC
  Administered 2012-09-13 – 2012-09-16 (×6): 200 mg via ORAL
  Filled 2012-09-13 (×7): qty 1

## 2012-09-13 MED ORDER — BUPIVACAINE LIPOSOME 1.3 % IJ SUSP: 20.0000 mL | Freq: Once | INTRAMUSCULAR | Status: DC

## 2012-09-13 MED ORDER — HYDROMORPHONE HCL PF 1 MG/ML IJ SOLN: 0.5000 mg | INTRAMUSCULAR | Status: DC | PRN

## 2012-09-13 MED ORDER — METHOCARBAMOL 500 MG PO TABS
500.0000 mg | ORAL_TABLET | Freq: Four times a day (QID) | ORAL | Status: DC | PRN
  Administered 2012-09-14 – 2012-09-16 (×4): 500 mg via ORAL
  Filled 2012-09-13 (×4): qty 1

## 2012-09-13 MED ORDER — FENTANYL CITRATE 0.05 MG/ML IJ SOLN
50.0000 ug | Freq: Once | INTRAMUSCULAR | Status: AC
  Administered 2012-09-13: 75 ug via INTRAVENOUS

## 2012-09-13 MED ORDER — POTASSIUM CITRATE ER 10 MEQ (1080 MG) PO TBCR
10.0000 meq | EXTENDED_RELEASE_TABLET | Freq: Three times a day (TID) | ORAL | Status: DC
  Administered 2012-09-13 – 2012-09-16 (×9): 10 meq via ORAL
  Filled 2012-09-13 (×11): qty 1

## 2012-09-13 MED ORDER — PHENOL 1.4 % MT LIQD: 1.0000 | OROMUCOSAL | Status: DC | PRN

## 2012-09-13 MED ORDER — PANTOPRAZOLE SODIUM 40 MG PO TBEC
40.0000 mg | DELAYED_RELEASE_TABLET | Freq: Every day | ORAL | Status: DC
  Administered 2012-09-14 – 2012-09-16 (×3): 40 mg via ORAL
  Filled 2012-09-13 (×3): qty 1

## 2012-09-13 MED ORDER — SODIUM CHLORIDE 0.9 % IV SOLN
INTRAVENOUS | Status: DC
  Administered 2012-09-13 (×2): via EPIDURAL
  Filled 2012-09-13 (×10): qty 20

## 2012-09-13 MED ORDER — DEXAMETHASONE SODIUM PHOSPHATE 10 MG/ML IJ SOLN
10.0000 mg | Freq: Once | INTRAMUSCULAR | Status: AC
  Administered 2012-09-14: 10 mg via INTRAVENOUS
  Filled 2012-09-13: qty 1

## 2012-09-13 MED ORDER — SODIUM CHLORIDE 0.9 % IJ SOLN
INTRAMUSCULAR | Status: DC | PRN
  Administered 2012-09-13: 68 mL via INTRAVENOUS

## 2012-09-13 MED ORDER — MEPERIDINE HCL 50 MG/ML IJ SOLN: 6.2500 mg | INTRAMUSCULAR | Status: DC | PRN

## 2012-09-13 MED ORDER — FENTANYL CITRATE 0.05 MG/ML IJ SOLN
INTRAMUSCULAR | Status: DC | PRN
  Administered 2012-09-13: 25 ug via INTRAVENOUS
  Administered 2012-09-13: 50 ug via INTRAVENOUS
  Administered 2012-09-13: 25 ug via INTRAVENOUS

## 2012-09-13 MED ORDER — METOPROLOL SUCCINATE ER 50 MG PO TB24
50.0000 mg | ORAL_TABLET | Freq: Every day | ORAL | Status: DC
  Administered 2012-09-14 – 2012-09-16 (×3): 50 mg via ORAL
  Filled 2012-09-13 (×4): qty 1

## 2012-09-13 MED ORDER — ONDANSETRON HCL 4 MG/2ML IJ SOLN: 4.0000 mg | Freq: Four times a day (QID) | INTRAMUSCULAR | Status: DC | PRN

## 2012-09-13 MED ORDER — ALUM & MAG HYDROXIDE-SIMETH 200-200-20 MG/5ML PO SUSP: 30.0000 mL | ORAL | Status: DC | PRN

## 2012-09-13 MED ORDER — NALOXONE HCL 1 MG/ML IJ SOLN
1.0000 ug/kg/h | INTRAVENOUS | Status: DC | PRN
  Filled 2012-09-13: qty 2

## 2012-09-13 MED ORDER — HYDROCHLOROTHIAZIDE 12.5 MG PO CAPS
12.5000 mg | ORAL_CAPSULE | Freq: Every day | ORAL | Status: DC
  Administered 2012-09-14 – 2012-09-16 (×3): 12.5 mg via ORAL
  Filled 2012-09-13 (×3): qty 1

## 2012-09-13 MED ORDER — KETOROLAC TROMETHAMINE 30 MG/ML IJ SOLN
INTRAMUSCULAR | Status: DC | PRN
  Administered 2012-09-13: 60 mg via INTRAVENOUS

## 2012-09-13 MED ORDER — OXYCODONE HCL 5 MG PO TABS: 5.0000 mg | ORAL_TABLET | Freq: Once | ORAL | Status: DC | PRN

## 2012-09-13 MED ORDER — PROMETHAZINE HCL 25 MG/ML IJ SOLN: 6.2500 mg | INTRAMUSCULAR | Status: DC | PRN

## 2012-09-13 MED ORDER — MENTHOL 3 MG MT LOZG: 1.0000 | LOZENGE | OROMUCOSAL | Status: DC | PRN

## 2012-09-13 MED ORDER — DIPHENHYDRAMINE HCL 50 MG/ML IJ SOLN
12.5000 mg | INTRAMUSCULAR | Status: DC | PRN
  Administered 2012-09-13 – 2012-09-15 (×2): 12.5 mg via INTRAVENOUS
  Filled 2012-09-13: qty 1

## 2012-09-13 MED ORDER — ACETAMINOPHEN 10 MG/ML IV SOLN
1000.0000 mg | Freq: Four times a day (QID) | INTRAVENOUS | Status: AC
  Administered 2012-09-13 – 2012-09-14 (×4): 1000 mg via INTRAVENOUS
  Filled 2012-09-13 (×7): qty 100

## 2012-09-13 MED ORDER — COSYNTROPIN 0.25 MG IJ SOLR
1.0000 mg | Freq: Once | INTRAMUSCULAR | Status: DC
  Filled 2012-09-13: qty 1

## 2012-09-13 MED ORDER — POLYETHYLENE GLYCOL 3350 17 G PO PACK
17.0000 g | PACK | Freq: Two times a day (BID) | ORAL | Status: DC
  Administered 2012-09-13 – 2012-09-16 (×5): 17 g via ORAL

## 2012-09-13 MED ORDER — EPHEDRINE SULFATE 50 MG/ML IJ SOLN
INTRAMUSCULAR | Status: DC | PRN
  Administered 2012-09-13: 5 mg via INTRAVENOUS
  Administered 2012-09-13 (×3): 10 mg via INTRAVENOUS
  Administered 2012-09-13: 5 mg via INTRAVENOUS

## 2012-09-13 MED ORDER — ONDANSETRON HCL 4 MG/2ML IJ SOLN: 4.0000 mg | Freq: Three times a day (TID) | INTRAMUSCULAR | Status: DC | PRN

## 2012-09-13 MED ORDER — BUPIVACAINE LIPOSOME 1.3 % IJ SUSP
INTRAMUSCULAR | Status: DC | PRN
  Administered 2012-09-13: 20 mL

## 2012-09-13 MED ORDER — DIPHENHYDRAMINE HCL 25 MG PO CAPS: 25.0000 mg | ORAL_CAPSULE | Freq: Four times a day (QID) | ORAL | Status: DC | PRN

## 2012-09-13 MED ORDER — BUPIVACAINE LIPOSOME 1.3 % IJ SUSP
20.0000 mL | Freq: Once | INTRAMUSCULAR | Status: DC
  Filled 2012-09-13: qty 20

## 2012-09-13 MED ORDER — FLEET ENEMA 7-19 GM/118ML RE ENEM: 1.0000 | ENEMA | Freq: Once | RECTAL | Status: AC | PRN

## 2012-09-13 MED ORDER — DEXAMETHASONE SODIUM PHOSPHATE 10 MG/ML IJ SOLN
10.0000 mg | Freq: Once | INTRAMUSCULAR | Status: AC
  Administered 2012-09-13: 4 mg via INTRAVENOUS

## 2012-09-13 MED ORDER — DIPHENHYDRAMINE HCL 50 MG/ML IJ SOLN
25.0000 mg | INTRAMUSCULAR | Status: DC | PRN
  Filled 2012-09-13: qty 1

## 2012-09-13 MED ORDER — ZOLPIDEM TARTRATE 5 MG PO TABS: 5.0000 mg | ORAL_TABLET | Freq: Every evening | ORAL | Status: DC | PRN

## 2012-09-13 MED ORDER — NALBUPHINE HCL 10 MG/ML IJ SOLN: 5.0000 mg | INTRAMUSCULAR | Status: DC | PRN

## 2012-09-13 MED ORDER — DEXAMETHASONE SODIUM PHOSPHATE 10 MG/ML IJ SOLN: 10.0000 mg | Freq: Once | INTRAMUSCULAR | Status: DC

## 2012-09-13 MED ORDER — FERROUS SULFATE 325 (65 FE) MG PO TABS
325.0000 mg | ORAL_TABLET | Freq: Three times a day (TID) | ORAL | Status: DC
  Administered 2012-09-14 – 2012-09-16 (×8): 325 mg via ORAL
  Filled 2012-09-13 (×11): qty 1

## 2012-09-13 MED ORDER — ACETAMINOPHEN 10 MG/ML IV SOLN
INTRAVENOUS | Status: DC | PRN
  Administered 2012-09-13: 1000 mg via INTRAVENOUS

## 2012-09-13 MED ORDER — METHOCARBAMOL 100 MG/ML IJ SOLN
500.0000 mg | Freq: Four times a day (QID) | INTRAVENOUS | Status: DC | PRN
  Filled 2012-09-13: qty 5

## 2012-09-13 MED ORDER — RIVAROXABAN 10 MG PO TABS
10.0000 mg | ORAL_TABLET | ORAL | Status: DC
  Filled 2012-09-13: qty 1

## 2012-09-13 MED ORDER — POTASSIUM CHLORIDE ER 10 MEQ PO TBCR: 10.0000 meq | EXTENDED_RELEASE_TABLET | Freq: Three times a day (TID) | ORAL | Status: DC

## 2012-09-13 MED ORDER — SCOPOLAMINE 1 MG/3DAYS TD PT72
1.0000 | MEDICATED_PATCH | Freq: Once | TRANSDERMAL | Status: AC
  Administered 2012-09-13: 1.5 mg via TRANSDERMAL

## 2012-09-13 MED ORDER — METOCLOPRAMIDE HCL 5 MG/ML IJ SOLN: 5.0000 mg | Freq: Three times a day (TID) | INTRAMUSCULAR | Status: DC | PRN

## 2012-09-13 MED ORDER — OXYCODONE HCL 5 MG PO TABS
5.0000 mg | ORAL_TABLET | ORAL | Status: DC | PRN
  Filled 2012-09-13: qty 2

## 2012-09-13 MED ORDER — SODIUM CHLORIDE 0.9 % IJ SOLN: 3.0000 mL | INTRAMUSCULAR | Status: DC | PRN

## 2012-09-13 MED ORDER — LACTATED RINGERS IV SOLN
INTRAVENOUS | Status: DC | PRN
  Administered 2012-09-13 (×3): via INTRAVENOUS

## 2012-09-13 MED ORDER — CEFAZOLIN SODIUM-DEXTROSE 2-3 GM-% IV SOLR
2.0000 g | Freq: Four times a day (QID) | INTRAVENOUS | Status: AC
  Administered 2012-09-13 (×2): 2 g via INTRAVENOUS
  Filled 2012-09-13 (×2): qty 50

## 2012-09-13 MED ORDER — SODIUM CHLORIDE 0.9 % IR SOLN
Status: DC | PRN
  Administered 2012-09-13: 3000 mL

## 2012-09-13 MED ORDER — ACETAMINOPHEN 10 MG/ML IV SOLN: 1000.0000 mg | Freq: Once | INTRAVENOUS | Status: DC | PRN

## 2012-09-13 SURGICAL SUPPLY — 67 items
ADH SKN CLS APL DERMABOND .7 (GAUZE/BANDAGES/DRESSINGS) ×2
BAG SPEC THK2 15X12 ZIP CLS (MISCELLANEOUS) ×1
BAG ZIPLOCK 12X15 (MISCELLANEOUS) ×2 IMPLANT
BANDAGE ELASTIC 6 VELCRO ST LF (GAUZE/BANDAGES/DRESSINGS) ×3 IMPLANT
BANDAGE ESMARK 6X9 LF (GAUZE/BANDAGES/DRESSINGS) ×1 IMPLANT
BLADE SAW RECIPROCATING 77.5 (BLADE) ×2 IMPLANT
BLADE SAW SGTL 13.0X1.19X90.0M (BLADE) ×2 IMPLANT
BNDG CMPR 9X6 STRL LF SNTH (GAUZE/BANDAGES/DRESSINGS) ×1
BNDG COHESIVE 4X5 TAN STRL (GAUZE/BANDAGES/DRESSINGS) ×2 IMPLANT
BNDG ESMARK 6X9 LF (GAUZE/BANDAGES/DRESSINGS) ×2
BOWL SMART MIX CTS (DISPOSABLE) ×4 IMPLANT
CEMENT HV SMART SET (Cement) ×8 IMPLANT
CLOTH BEACON ORANGE TIMEOUT ST (SAFETY) ×2 IMPLANT
CUFF TOURN SGL QUICK 34 (TOURNIQUET CUFF) ×4
CUFF TRNQT CYL 34X4X40X1 (TOURNIQUET CUFF) ×2 IMPLANT
DERMABOND ADVANCED (GAUZE/BANDAGES/DRESSINGS) ×2
DERMABOND ADVANCED .7 DNX12 (GAUZE/BANDAGES/DRESSINGS) IMPLANT
DRAPE EXTREMITY BILATERAL (DRAPE) ×2 IMPLANT
DRAPE LG THREE QUARTER DISP (DRAPES) ×9 IMPLANT
DRAPE POUCH INSTRU U-SHP 10X18 (DRAPES) ×2 IMPLANT
DRAPE U-SHAPE 47X51 STRL (DRAPES) ×4 IMPLANT
DRSG ADAPTIC 3X8 NADH LF (GAUZE/BANDAGES/DRESSINGS) ×4 IMPLANT
DRSG AQUACEL AG ADV 3.5X10 (GAUZE/BANDAGES/DRESSINGS) ×2 IMPLANT
DRSG PAD ABDOMINAL 8X10 ST (GAUZE/BANDAGES/DRESSINGS) ×8 IMPLANT
DRSG TEGADERM 4X4.75 (GAUZE/BANDAGES/DRESSINGS) ×2 IMPLANT
DURAPREP 26ML APPLICATOR (WOUND CARE) ×2 IMPLANT
ELECT REM PT RETURN 9FT ADLT (ELECTROSURGICAL) ×2
ELECTRODE REM PT RTRN 9FT ADLT (ELECTROSURGICAL) ×1 IMPLANT
EVACUATOR 1/8 PVC DRAIN (DRAIN) ×4 IMPLANT
GAUZE SPONGE 2X2 8PLY STRL LF (GAUZE/BANDAGES/DRESSINGS) IMPLANT
GLOVE BIOGEL PI IND STRL 7.5 (GLOVE) ×1 IMPLANT
GLOVE BIOGEL PI IND STRL 8 (GLOVE) ×1 IMPLANT
GLOVE BIOGEL PI INDICATOR 7.5 (GLOVE) ×1
GLOVE BIOGEL PI INDICATOR 8 (GLOVE) ×1
GLOVE ECLIPSE 8.0 STRL XLNG CF (GLOVE) IMPLANT
GLOVE ORTHO TXT STRL SZ7.5 (GLOVE) ×4 IMPLANT
GLOVE SURG ORTHO 8.0 STRL STRW (GLOVE) ×2 IMPLANT
GOWN BRE IMP PREV XXLGXLNG (GOWN DISPOSABLE) ×4 IMPLANT
GOWN STRL NON-REIN LRG LVL3 (GOWN DISPOSABLE) ×2 IMPLANT
HANDPIECE INTERPULSE COAX TIP (DISPOSABLE) ×2
IMMOBILIZER KNEE 20 (SOFTGOODS) ×4
IMMOBILIZER KNEE 20 THIGH 36 (SOFTGOODS) ×2 IMPLANT
KIT BASIN OR (CUSTOM PROCEDURE TRAY) ×2 IMPLANT
MANIFOLD NEPTUNE II (INSTRUMENTS) ×2 IMPLANT
NS IRRIG 1000ML POUR BTL (IV SOLUTION) ×4 IMPLANT
PACK TOTAL JOINT (CUSTOM PROCEDURE TRAY) ×2 IMPLANT
PADDING CAST COTTON 6X4 STRL (CAST SUPPLIES) ×4 IMPLANT
POSITIONER SURGICAL ARM (MISCELLANEOUS) ×2 IMPLANT
SET HNDPC FAN SPRY TIP SCT (DISPOSABLE) ×1 IMPLANT
SET PAD KNEE POSITIONER (MISCELLANEOUS) ×4 IMPLANT
SPONGE GAUZE 2X2 STER 10/PKG (GAUZE/BANDAGES/DRESSINGS) ×2
SPONGE GAUZE 4X4 12PLY (GAUZE/BANDAGES/DRESSINGS) ×8 IMPLANT
SPONGE LAP 18X18 X RAY DECT (DISPOSABLE) ×4 IMPLANT
STAPLER VISISTAT 35W (STAPLE) IMPLANT
STOCKINETTE 8 INCH (MISCELLANEOUS) ×2 IMPLANT
STRIP CLOSURE SKIN 1/2X4 (GAUZE/BANDAGES/DRESSINGS) ×8 IMPLANT
SUCTION FRAZIER 12FR DISP (SUCTIONS) ×2 IMPLANT
SUT MNCRL AB 4-0 PS2 18 (SUTURE) ×4 IMPLANT
SUT VIC AB 1 CT1 27 (SUTURE) ×12
SUT VIC AB 1 CT1 27XBRD ANTBC (SUTURE) ×6 IMPLANT
SUT VIC AB 2-0 CT1 27 (SUTURE) ×16
SUT VIC AB 2-0 CT1 TAPERPNT 27 (SUTURE) ×8 IMPLANT
SUT VLOC 180 0 24IN GS25 (SUTURE) ×4 IMPLANT
TOWEL OR 17X26 10 PK STRL BLUE (TOWEL DISPOSABLE) ×4 IMPLANT
TRAY FOLEY CATH 14FRSI W/METER (CATHETERS) ×2 IMPLANT
WATER STERILE IRR 1500ML POUR (IV SOLUTION) ×2 IMPLANT
WRAP KNEE MAXI GEL POST OP (GAUZE/BANDAGES/DRESSINGS) ×4 IMPLANT

## 2012-09-13 NOTE — Plan of Care (Signed)
Problem: Consults Goal: Diagnosis- Total Joint Replacement bilaterall total kness

## 2012-09-13 NOTE — Anesthesia Preprocedure Evaluation (Addendum)
Anesthesia Evaluation  Patient identified by MRN, date of birth, ID band Patient awake    Reviewed: Allergy & Precautions, H&P , NPO status , Patient's Chart, lab work & pertinent test results, reviewed documented beta blocker date and time   History of Anesthesia Complications Negative for: history of anesthetic complications  Airway Mallampati: II TM Distance: >3 FB Neck ROM: Full    Dental  (+) Dental Advisory Given and Teeth Intact   Pulmonary neg pulmonary ROS,  breath sounds clear to auscultation        Cardiovascular hypertension, Pt. on medications and Pt. on home beta blockers negative cardio ROS  + dysrhythmias Supra Ventricular Tachycardia Rhythm:Regular Rate:Normal     Neuro/Psych negative neurological ROS  negative psych ROS   GI/Hepatic negative GI ROS, Neg liver ROS, GERD-  Medicated,  Endo/Other  negative endocrine ROS  Renal/GU negative Renal ROS     Musculoskeletal negative musculoskeletal ROS (+)   Abdominal   Peds  Hematology negative hematology ROS (+)   Anesthesia Other Findings   Reproductive/Obstetrics                          Anesthesia Physical Anesthesia Plan  ASA: III  Anesthesia Plan: Epidural   Post-op Pain Management:    Induction: Intravenous  Airway Management Planned:   Additional Equipment:   Intra-op Plan:   Post-operative Plan:   Informed Consent: I have reviewed the patients History and Physical, chart, labs and discussed the procedure including the risks, benefits and alternatives for the proposed anesthesia with the patient or authorized representative who has indicated his/her understanding and acceptance.   Dental advisory given  Plan Discussed with: CRNA  Anesthesia Plan Comments:        Anesthesia Quick Evaluation

## 2012-09-13 NOTE — Anesthesia Procedure Notes (Signed)
Epidural Patient location during procedure: pre-op Start time: 09/13/2012 10:00 AM End time: 09/13/2012 10:15 AM  Staffing Anesthesiologist: Lewie Loron R Performed by: anesthesiologist   Preanesthetic Checklist Completed: patient identified, surgical consent, pre-op evaluation, timeout performed, IV checked, risks and benefits discussed and monitors and equipment checked  Epidural Patient position: sitting Prep: ChloraPrep Patient monitoring: heart rate, continuous pulse ox, blood pressure and cardiac monitor Approach: midline Injection technique: LOR air and LOR saline  Needle:  Needle type: Tuohy  Needle gauge: 17 G Needle length: 9 cm Needle insertion depth: 8 cm Catheter type: closed end flexible Catheter size: 19 Gauge Catheter at skin depth: 14 cm Test dose: negative and 2% lidocaine with Epi 1:200 K  Assessment Sensory level: T10 Events: blood not aspirated, injection not painful, no injection resistance, negative IV test and no paresthesia

## 2012-09-13 NOTE — Transfer of Care (Signed)
Immediate Anesthesia Transfer of Care Note  Patient: [name] [name]  Procedure(s) Performed: Procedure(s): TOTAL KNEE BILATERAL (Bilateral)  Patient Location: PACU  Anesthesia Type:General and Epidural  Level of Consciousness: awake, alert , oriented and patient cooperative  Airway & Oxygen Therapy: Patient Spontanous Breathing and Patient connected to face mask oxygen  Post-op Assessment: Report given to PACU RN and Post -op Vital signs reviewed and stable  Post vital signs: Reviewed and stable  Complications: No apparent anesthesia complications

## 2012-09-13 NOTE — Anesthesia Postprocedure Evaluation (Signed)
Anesthesia Post Note  Patient: [name] [name]  Procedure(s) Performed: Procedure(s) (LRB): TOTAL KNEE BILATERAL (Bilateral)  Anesthesia type: General with Epidural  Patient location: PACU  Post pain: Pain level controlled  Post assessment: Post-op Vital signs reviewed  Last Vitals: BP 134/72  Pulse 51  Temp(Src) 36.4 C (Oral)  Resp 15  Ht 6' (1.829 m)  Wt 218 lb 4.1 oz (99 kg)  BMI 29.59 kg/m2  SpO2 99%  Post vital signs: Reviewed  Level of consciousness: sedated  Complications: No apparent anesthesia complications

## 2012-09-13 NOTE — Op Note (Signed)
NAME:  [name] [name]                      MEDICAL RECORD NO.:  [id number]                             FACILITY:  [facility]      PHYSICIAN:  Madlyn Frankel. Charlann Boxer, M.D.  DATE OF BIRTH:  12/30/1935      DATE OF PROCEDURE:  09/13/2012                                     OPERATIVE REPORT         PREOPERATIVE DIAGNOSIS:  Bilateral knee osteoarthritis.      POSTOPERATIVE DIAGNOSIS:  Bilateral knee osteoarthritis.      FINDINGS:  The patient was noted to have complete loss of cartilage and   bone-on-bone arthritis with associated osteophytes in the medial and patellofemoral compartments of   both knees with a significant synovitis and large effusions with popliteal cysts.      PROCEDURE:  Bilateral total knee replacement.      COMPONENTS USED:  DePuy rotating platform posterior stabilized knee   system.  Both knees had same sizes used, a size 5 femur, 4 tibia, 12.5 mm PS insert, and 41 patellar   buttons.      SURGEON:  Madlyn Frankel. Charlann Boxer, M.D.      ASSISTANT:  Lanney Gins, PA-C.      ANESTHESIA:  General and Epidural.      SPECIMENS:  None.      COMPLICATION:  None.      DRAINS:  One Hemovac.  EBL: <150cc      TOURNIQUET TIME:   Total Tourniquet Time Documented: Thigh (Left) - 40 minutes Total: Thigh (Left) - 40 minutes  Thigh (Right) - 46 minutes Total: Thigh (Right) - 46 minutes  .      The patient was stable to the recovery room.      INDICATION FOR PROCEDURE:  [name] [name] is a 77 y.o. male patient of   mine.  The patient had been seen, evaluated, and treated conservatively in the   office with medication, activity modification, and injections.  The patient had   radiographic changes of complete bone loss in the medial compartments with patellofemoral changes noted as well with endplate sclerosis and osteophytes noted on both knees.      The patient failed conservative measures including medication, injections, and activity modification, and at this point was ready for more  definitive measures.   Based on the radiographic changes and failed conservative measures, the patient   decided to proceed with total knee replacement.  Risks of infection,   DVT, component failure, need for revision surgery, postop course, and   expectations were all   discussed and reviewed.  Consent was obtained for benefit of pain   relief.      PROCEDURE IN DETAIL:  The patient was brought to the operative theater.   Once adequate anesthesia, preoperative antibiotics, 2 gm of Ancef administered, the patient was positioned supine with the bilateral thigh tourniquets placed.  Then   both lower extremities were prepped and draped in sterile fashion.  A time-   out was performed identifying the patient, planned procedure, and   extremities      The both lower extremity was  placed in the [facility] leg holder.    Attention was first directed to the left knee.  The leg was   exsanguinated, tourniquet elevated to 250 mmHg.  A midline incision was   made followed by median parapatellar arthrotomy.  Following initial   exposure, attention was first directed to the patella.  Precut   measurement was noted to be 25-26 mm.  I resected down to 15 mm and used a   41 patellar button to restore patellar height as well as cover the cut   surface.      The lug holes were drilled and a metal shim was placed to protect the   patella from retractors and saw blades.      At this point, attention was now directed to the femur.  The femoral   canal was opened with a drill, irrigated to try to prevent fat emboli.  An   intramedullary rod was passed at 5 degrees valgus, 10 mm of bone was   resected off the distal femur.  Following this resection, the tibia was   subluxated anteriorly.  Using the extramedullary guide, 10 mm of bone was resected off   the proximal lateral tibia.  We confirmed the gap would be   stable medially and laterally with a 10 mm insert as well as confirmed   the cut was perpendicular  in the coronal plane, checking with an alignment rod.      Once this was done, I sized the femur to be a size 5 in the anterior-   posterior dimension, chose a standard component based on medial and   lateral dimension.  The size 5 rotation block was then pinned in   position anterior referenced using the C-clamp to set rotation.  The   anterior, posterior, and  chamfer cuts were made without difficulty nor   notching making certain that I was along the anterior cortex to help   with flexion gap stability.      The final box cut was made off the lateral aspect of distal femur.      At this point, the tibia was sized to be a size 4, the size 4 tray was   then pinned in position through the medial third of the tubercle,   drilled, and keel punched.  Trial reduction was now carried with a 5 femur,  4 tibia, a 12.5 mm insert, and the 41 patella botton.  The knee was brought to   extension, full extension with good flexion stability with the patella   tracking through the trochlea without application of pressure.  Given   all these findings, the trial components removed.  Final components were   opened and cement was mixed.  The knee was irrigated with normal saline   solution and pulse lavage.  The synovial lining was   then injected with 0.25% Marcaine with epinephrine and 1 cc of Toradol,   total of 61 cc.      The knee was irrigated.  Final implants were then cemented onto clean and   dried cut surfaces of bone with the knee brought to extension with a 12.5 mm trial insert.      Once the cement had fully cured, the excess cement was removed   throughout the knee.  I confirmed I was satisfied with the range of   motion and stability, and the final 12.5 mm PS insert was chosen.  It was   placed into the knee.  The tourniquet had been let down at 40 minutes.  No significant   hemostasis required.  The medium Hemovac drain was placed deep.  The   extensor mechanism was then  reapproximated using #1 Vicryl with the knee   in flexion.  The   remaining wound was closed with 2-0 Vicryl and running 4-0 Monocryl.   The knee was cleaned, dried, dressed sterilely using Dermabond and   Aquacel dressing.  Drain site dressed separately.  The patient was then   brought to recovery room in stable condition, tolerating the procedure   well.    At the time of closure of the left knee attention was then directed to the right knee.  The leg was   exsanguinated, tourniquet elevated to 250 mmHg.  A midline incision was   made followed by median parapatellar arthrotomy.  Following initial   exposure, attention was first directed to the patella.  Precut   measurement was noted to be 25-26 mm.  I resected down to 15 mm and used a   41 patellar button to restore patellar height as well as cover the cut   surface.      The lug holes were drilled and a metal shim was placed to protect the   patella from retractors and saw blades.      At this point, attention was now directed to the femur.  The femoral   canal was opened with a drill, irrigated to try to prevent fat emboli.  An   intramedullary rod was passed at 5 degrees valgus, 10 mm of bone was   resected off the distal femur.  Following this resection, the tibia was   subluxated anteriorly.  Using the extramedullary guide, 10 mm of bone was resected off   the proximal lateral tibia.  We confirmed the gap would be   stable medially and laterally with a 10 mm insert as well as confirmed   the cut was perpendicular in the coronal plane, checking with an alignment rod.      Once this was done, I sized the femur to be a size 5 in the anterior-   posterior dimension, chose a standard component based on medial and   lateral dimension.  The size 5 rotation block was then pinned in   position anterior referenced using the C-clamp to set rotation.  The   anterior, posterior, and  chamfer cuts were made without difficulty nor    notching making certain that I was along the anterior cortex to help   with flexion gap stability.      The final box cut was made off the lateral aspect of distal femur.      At this point, the tibia was sized to be a size 4, the size 4 tray was   then pinned in position through the medial third of the tubercle,   drilled, and keel punched.  Trial reduction was now carried with a 5 femur,  4 tibia, a 12.5 mm insert, and the 41 patella botton.  The knee was brought to   extension, full extension with good flexion stability with the patella   tracking through the trochlea without application of pressure.  Given   all these findings, the trial components removed.  Final components were   opened and cement was mixed.  The knee was irrigated with normal saline   solution and pulse lavage.  The synovial lining was   then injected with 0.25% Marcaine with epinephrine  and 1 cc of Toradol,   total of 61 cc.      The knee was irrigated.  Final implants were then cemented onto clean and   dried cut surfaces of bone with the knee brought to extension with a 12.5 mm trial insert.      Once the cement had fully cured, the excess cement was removed   throughout the knee.  I confirmed I was satisfied with the range of   motion and stability, and the final 12.5 mm PS insert was chosen.  It was   placed into the knee.      The tourniquet had been let down at 40 minutes.  No significant   hemostasis required.  The medium Hemovac drain was placed deep.  The   extensor mechanism was then reapproximated using #1 Vicryl with the knee   in flexion.  The   remaining wound was closed with 2-0 Vicryl and running 4-0 Monocryl.   The knee was cleaned, dried, dressed sterilely using Dermabond and   Aquacel dressing.  Drain site dressed separately.  The patient was then   brought to recovery room in stable condition, tolerating the procedure   well.   Please note that Physician Assistant, Lanney Gins, was  present for the entirety of the case, and was utilized for pre-operative positioning, peri-operative retractor management, general facilitation of the procedure.  He was also utilized for primary wound closure at the end of the case.              Madlyn Frankel Charlann Boxer, M.D.

## 2012-09-13 NOTE — Preoperative (Signed)
Beta Blockers   Reason not to administer Beta Blockers:Not Applicable 

## 2012-09-13 NOTE — Interval H&P Note (Signed)
History and Physical Interval Note:  09/13/2012 10:29 AM  [name] [name]  has presented today for surgery, with the diagnosis of osteoarthritis bilateral knee   The various methods of treatment have been discussed with the patient and family. After consideration of risks, benefits and other options for treatment, the patient has consented to  Procedure(s): TOTAL KNEE BILATERAL (Bilateral) as a surgical intervention .  The patient's history has been reviewed, patient examined, no change in status, stable for surgery.  I have reviewed the patient's chart and labs.  Questions were answered to the patient's satisfaction.     [name]

## 2012-09-14 ENCOUNTER — Encounter (HOSPITAL_COMMUNITY): Payer: Self-pay | Admitting: Orthopedic Surgery

## 2012-09-14 DIAGNOSIS — I119 Hypertensive heart disease without heart failure: Secondary | ICD-10-CM | POA: Diagnosis not present

## 2012-09-14 DIAGNOSIS — D62 Acute posthemorrhagic anemia: Secondary | ICD-10-CM | POA: Diagnosis not present

## 2012-09-14 DIAGNOSIS — M1710 Unilateral primary osteoarthritis, unspecified knee: Secondary | ICD-10-CM | POA: Diagnosis not present

## 2012-09-14 DIAGNOSIS — I471 Supraventricular tachycardia: Secondary | ICD-10-CM | POA: Diagnosis not present

## 2012-09-14 LAB — CBC
MCH: 31 pg (ref 26.0–34.0)
MCHC: 33.3 g/dL (ref 30.0–36.0)
MCV: 92.9 fL (ref 78.0–100.0)
Platelets: 156 10*3/uL (ref 150–400)
RDW: 13.5 % (ref 11.5–15.5)

## 2012-09-14 LAB — BASIC METABOLIC PANEL
CO2: 29 mEq/L (ref 19–32)
Calcium: 7.9 mg/dL — ABNORMAL LOW (ref 8.4–10.5)
Creatinine, Ser: 1.22 mg/dL (ref 0.50–1.35)
GFR calc Af Amer: 66 mL/min — ABNORMAL LOW (ref >90)
GFR calc non Af Amer: 57 mL/min — ABNORMAL LOW (ref >90)

## 2012-09-14 MED ORDER — OXYCODONE HCL 5 MG PO TABS
15.0000 mg | ORAL_TABLET | ORAL | Status: DC
  Administered 2012-09-14 – 2012-09-15 (×4): 15 mg via ORAL
  Administered 2012-09-15 (×2): 10 mg via ORAL
  Administered 2012-09-15 – 2012-09-16 (×6): 15 mg via ORAL
  Filled 2012-09-14: qty 1
  Filled 2012-09-14: qty 3
  Filled 2012-09-14: qty 2
  Filled 2012-09-14 (×2): qty 3
  Filled 2012-09-14: qty 2
  Filled 2012-09-14 (×6): qty 3

## 2012-09-14 MED ORDER — RIVAROXABAN 10 MG PO TABS
10.0000 mg | ORAL_TABLET | Freq: Once | ORAL | Status: AC
  Administered 2012-09-14: 10 mg via ORAL
  Filled 2012-09-14: qty 1

## 2012-09-14 MED ORDER — SODIUM CHLORIDE 0.9 % IV BOLUS (SEPSIS): 500.0000 mL | Freq: Once | INTRAVENOUS | Status: DC

## 2012-09-14 MED ORDER — RIVAROXABAN 10 MG PO TABS
10.0000 mg | ORAL_TABLET | ORAL | Status: DC
  Administered 2012-09-15: 10 mg via ORAL
  Filled 2012-09-14 (×2): qty 1

## 2012-09-14 NOTE — Progress Notes (Signed)
During a PT session with the patient, the patient became light headed and was experiencing some moderate pressure across his chest. BP was 90/60 manual, pulse 45 while lying down in bed with head elevated at 45 degrees. This RN informed Dr. Charlann Boxer of the incident. An 12 lead ECG and a 500cc NS bolus were ordered and performed. The ECG was comparable to the last one performed in February of this year, and showed no acute changes. At this time, the patient is feeling no chest pressure, and states he feels much better. He did express his desire to try to do physical therapy later this afternoon. Will continue to monitor the patient closely and informed him to let staff know immediately if any chest discomfort returns.

## 2012-09-14 NOTE — Progress Notes (Addendum)
Anesthesia Post Note  Patient: [name] [name]  Procedure(s) Performed: Procedure(s) (LRB): TOTAL KNEE BILATERAL (Bilateral)  Anesthesia type: Epidural  Patient location:   Post pain: Pain level well controlled  Last Vitals: BP 94/59  Pulse 51  Temp(Src) 36.6 C (Oral)  Resp 12  Ht 6' (1.829 m)  Wt 218 lb (98.884 kg)  BMI 29.56 kg/m2  SpO2 100%  Post vital signs: Reviewed  Plan: No anticoagulation given. Epidural removed without issue. Tip intact. Plan to start oral anticoagulation (xarelto) 6 hours post epidural removal (2000). Discussed this with nurse, patient and Dr. Charlann Boxer and will place an order in Epic. No apparent anesthesia complications.

## 2012-09-14 NOTE — Evaluation (Signed)
Occupational Therapy Evaluation Patient Details Name: [name] [name] MRN: [id number] DOB: 06-06-1935 Today's Date: 09/14/2012 Time: 7829-5621 OT Time Calculation (min): 37 min  OT Assessment / Plan / Recommendation Clinical Impression  Pt is s/p bilateral TKA and was able to transfer to EOB but then became very dizzy and nauseous. Pt returned to supine. Also complained of pain across chest. Nursing called to room and vitals taken. Feel pt will benefit from skilled OT services and do well once dizziness improves.     OT Assessment  Patient needs continued OT Services    Follow Up Recommendations  SNF;Supervision/Assistance - 24 hour    Barriers to Discharge      Equipment Recommendations  None recommended by OT    Recommendations for Other Services    Frequency  Min 2X/week    Precautions / Restrictions Precautions Precautions: Knee Precaution Comments: On intial eval, pt able to perform SLR on RLE without assist, however pt did not have KI in room, therefore sat EOB without KI.  Required Braces or Orthoses: Knee Immobilizer - Left Knee Immobilizer - Left: Discontinue once straight leg raise with < 10 degree lag Restrictions Weight Bearing Restrictions: No Other Position/Activity Restrictions: WBAT BLE        ADL  Eating/Feeding: Simulated;Independent Where Assessed - Eating/Feeding: Bed level Grooming: Simulated;Wash/dry hands;Set up Where Assessed - Grooming: Supine, head of bed up ADL Comments: Cotx with PT. Pt transferred to EOB and sat for about 30-40 seconds and became very dizzy and nauseous. Did not improve with time. Returned to supine. Pt also complaining of pain across chest. Nursing called to room and assessed pt. BP after returning to bed 85/54. Nursing took BP manually and it was 90/60. Pt states dizziness improved after resting in bed. Nursing to monitor. Limited eval due to dizziness at EOB.     OT Diagnosis: Generalized weakness  OT Problem List: Decreased  strength;Decreased activity tolerance OT Treatment Interventions: Self-care/ADL training;Therapeutic activities;Patient/family education;DME and/or AE instruction   OT Goals Acute Rehab OT Goals OT Goal Formulation: With patient Time For Goal Achievement: 09/21/12 Potential to Achieve Goals: Good ADL Goals Pt Will Perform Grooming: with min assist;Standing at sink ADL Goal: Grooming - Progress: Goal set today Pt Will Perform Lower Body Bathing: with min assist;with adaptive equipment;Sit to stand from chair;Sit to stand from bed ADL Goal: Lower Body Bathing - Progress: Goal set today Pt Will Perform Lower Body Dressing: with min assist;Sit to stand from chair;Sit to stand from bed ADL Goal: Lower Body Dressing - Progress: Goal set today Pt Will Transfer to Toilet: with min assist;Ambulation;3-in-1 ADL Goal: Toilet Transfer - Progress: Goal set today Pt Will Perform Toileting - Clothing Manipulation: with min assist;Standing ADL Goal: Toileting - Clothing Manipulation - Progress: Goal set today  Visit Information  Last OT Received On: 09/14/12 Assistance Needed: +2 PT/OT Co-Evaluation/Treatment: Yes    Subjective Data  Subjective: what are we doing today? Patient Stated Goal: wants to do what he can   Prior Functioning     Home Living Lives With: Spouse Available Help at Discharge: Family Type of Home: House Home Access: Stairs to enter Secretary/administrator of Steps: 4 Home Adaptive Equipment: Bedside commode/3-in-1;Walker - rolling;Reacher Additional Comments: pt planning SNF Prior Function Level of Independence: Independent Communication Communication: No difficulties         Vision/Perception     Cognition  Cognition Arousal/Alertness: Awake/alert Behavior During Therapy: WFL for tasks assessed/performed Overall Cognitive Status: Within Functional Limits for tasks assessed  Extremity/Trunk Assessment Right Upper Extremity Assessment RUE  ROM/Strength/Tone: WFL for tasks assessed Left Upper Extremity Assessment LUE ROM/Strength/Tone: WFL for tasks assessed Right Lower Extremity Assessment RLE ROM/Strength/Tone: Deficits RLE ROM/Strength/Tone Deficits: ankle motions WFL, able to perform SLR without assist, knee flex to approx 60 deg.  RLE Sensation: WFL - Light Touch Left Lower Extremity Assessment LLE ROM/Strength/Tone: Deficits LLE ROM/Strength/Tone Deficits: ankle motions WFL, unable to perform SLR without assist (was able to initiate it), knee flex to approx 30-40 deg AAROM.  Trunk Assessment Trunk Assessment: Normal     Mobility Bed Mobility Bed Mobility: Supine to Sit;Sit to Supine Supine to Sit: 4: Min assist;HOB elevated Sit to Supine: 3: Mod assist;HOB flat Details for Bed Mobility Assistance: Assist for LLE out of bed with cues for hand placement and technique.  Also required assist for BLE into bed, but he was able to scoot himself closer to EOB well prior to lying back down.  Transfers Details for Transfer Assistance: Unable to attempt standing due to pt very light headed, dizzy, nauseated and with chest pain.  RN notified and EKG taken.      Exercise     Balance     End of Session OT - End of Session Activity Tolerance: Other (comment) (dizziness and nausea) Patient left: in bed;with call bell/phone within reach;with nursing in room  GO     Lennox Laity [phone]. 09/14/2012, 11:41 AM

## 2012-09-14 NOTE — Progress Notes (Signed)
Clinical Social Work Department CLINICAL SOCIAL WORK PLACEMENT NOTE 09/14/2012  Patient:  [name] [name], [name] [name]  Account Number:  [id number] Admit date:  09/13/2012  Clinical Social Worker:  Cori Razor, LCSW  Date/time:  09/14/2012 02:20 PM  Clinical Social Work is seeking post-discharge placement for this patient at the following level of care:   SKILLED NURSING   (*CSW will update this form in Epic as items are completed)     Patient/family provided with [facility] Department of Clinical Social Work's list of facilities offering this level of care within the geographic area requested by the patient (or if unable, by the patient's family).  09/14/2012  Patient/family informed of their freedom to choose among providers that offer the needed level of care, that participate in Medicare, Medicaid or managed care program needed by the patient, have an available bed and are willing to accept the patient.    Patient/family informed of [facility]' ownership interest in [facility], as well as of the fact that they are under no obligation to receive care at this facility.  PASARR submitted to EDS on 09/13/2012 PASARR number received from EDS on 09/13/2012  FL2 transmitted to all facilities in geographic area requested by pt/family on  09/14/2012 FL2 transmitted to all facilities within larger geographic area on   Patient informed that his/her managed care company has contracts with or will negotiate with  certain facilities, including the following:     Patient/family informed of bed offers received:  09/14/2012 Patient chooses bed at [facility] Physician recommends and patient chooses bed at    Patient to be transferred to [facility] on   Patient to be transferred to facility by   The following physician request were entered in Epic:   Additional Comments:  Cori Razor LCSW [phone]

## 2012-09-14 NOTE — Evaluation (Signed)
Physical Therapy Evaluation Patient Details Name: [name] [name] MRN: [id number] DOB: [birth year] Today's Date: 09/14/2012 Time: 4098-1191 PT Time Calculation (min): 37 min  PT Assessment / Plan / Recommendation Clinical Impression  Pt presents s/p bilateral TKA POD 1 with decreased strength, ROM and mobility.  Tolerated sitting at EOB, however became very light headed and dizzy and was assisted back to bed.  BP finally read 85/54 and 90/60 when taken manually.  RN aware.  Pt also states chest pressure across chest, but it was improving with lying down.     PT Assessment  Patient needs continued PT services    Follow Up Recommendations  SNF;Supervision/Assistance - 24 hour    Does the patient have the potential to tolerate intense rehabilitation      Barriers to Discharge Decreased caregiver support      Equipment Recommendations  None recommended by PT    Recommendations for Other Services     Frequency 7X/week    Precautions / Restrictions Precautions Precautions: Knee Precaution Comments: On intial eval, pt able to perform SLR on RLE without assist, however pt did not have KI in room, therefore sat EOB without KI.  Required Braces or Orthoses: Knee Immobilizer - Left Knee Immobilizer - Left: Discontinue once straight leg raise with < 10 degree lag Restrictions Weight Bearing Restrictions: No Other Position/Activity Restrictions: WBAT BLE   Pertinent Vitals/Pain No pain due to epidural      Mobility  Bed Mobility Bed Mobility: Supine to Sit;Sit to Supine Supine to Sit: 4: Min assist;HOB elevated Sit to Supine: 3: Mod assist;HOB flat Details for Bed Mobility Assistance: Assist for LLE out of bed with cues for hand placement and technique.  Also required assist for BLE into bed, but he was able to scoot himself closer to EOB well prior to lying back down.  Transfers Details for Transfer Assistance: Unable to attempt standing due to pt very light headed, dizzy, nauseated  and with chest pain.  RN notified and EKG taken.  Ambulation/Gait Stairs: No Wheelchair Mobility Wheelchair Mobility: No    Exercises     PT Diagnosis: Difficulty walking;Abnormality of gait;Acute pain  PT Problem List: Decreased strength;Decreased range of motion;Decreased activity tolerance;Decreased balance;Decreased mobility;Decreased coordination;Decreased knowledge of use of DME;Decreased safety awareness;Decreased knowledge of precautions;Pain PT Treatment Interventions: DME instruction;Gait training;Functional mobility training;Therapeutic activities;Therapeutic exercise;Balance training;Patient/family education   PT Goals Acute Rehab PT Goals PT Goal Formulation: With patient Time For Goal Achievement: 09/21/12 Potential to Achieve Goals: Good Pt will go Supine/Side to Sit: with supervision PT Goal: Supine/Side to Sit - Progress: Goal set today Pt will go Sit to Supine/Side: with supervision PT Goal: Sit to Supine/Side - Progress: Goal set today Pt will go Sit to Stand: with supervision PT Goal: Sit to Stand - Progress: Goal set today Pt will go Stand to Sit: with supervision PT Goal: Stand to Sit - Progress: Goal set today Pt will Ambulate: 51 - 150 feet;with supervision;with least restrictive assistive device PT Goal: Ambulate - Progress: Goal set today Pt will Perform Home Exercise Program: with supervision, verbal cues required/provided PT Goal: Perform Home Exercise Program - Progress: Goal set today  Visit Information  Last PT Received On: 09/14/12 Assistance Needed: +2    Subjective Data  Subjective: I didn't know it was going to be like this.  Patient Stated Goal: to get back to PLOF.    Prior Functioning  Home Living Lives With: Spouse Available Help at Discharge: Family Type of Home: House Home  Access: Stairs to enter [employer] of Steps: 4 Home Adaptive Equipment: Bedside commode/3-in-1;Walker - rolling;Reacher Additional Comments: pt  planning SNF Prior Function Level of Independence: Independent Communication Communication: No difficulties    Cognition  Cognition Arousal/Alertness: Awake/alert Behavior During Therapy: WFL for tasks assessed/performed Overall Cognitive Status: Within Functional Limits for tasks assessed    Extremity/Trunk Assessment Right Lower Extremity Assessment RLE ROM/Strength/Tone: Deficits RLE ROM/Strength/Tone Deficits: ankle motions WFL, able to perform SLR without assist, knee flex to approx 60 deg.  RLE Sensation: WFL - Light Touch Left Lower Extremity Assessment LLE ROM/Strength/Tone: Deficits LLE ROM/Strength/Tone Deficits: ankle motions WFL, unable to perform SLR without assist (was able to initiate it), knee flex to approx 30-40 deg AAROM.  Trunk Assessment Trunk Assessment: Normal   Balance    End of Session PT - End of Session Activity Tolerance: Other (comment) (Limited by nausea, light headedness, dizziness) Patient left: in bed;with call bell/phone within reach;with nursing in room Nurse Communication: Mobility status  GP     Vista Deck 09/14/2012, 9:46 AM

## 2012-09-14 NOTE — Plan of Care (Signed)
Problem: Consults Goal: Diagnosis- Total Joint Replacement Outcome: Completed/Met Date Met:  09/14/12 Primary Total Knee BILATERAL

## 2012-09-14 NOTE — Progress Notes (Signed)
Received orders for rw and commode.  Patient will d/c to SNF.

## 2012-09-14 NOTE — Progress Notes (Signed)
Patient ID: [name] [name], male   DOB: Aug 23, 1935, 77 y.o.   MRN: [id number] Subjective: 1 Day Post-Op Procedure(s) (LRB): TOTAL KNEE BILATERAL (Bilateral)    Patient reports pain as mild. At this point doing well as epidural still in place.  Otherwise no events overnight  Objective:   VITALS:   Filed Vitals:   09/14/12 1015  BP: 94/59  Pulse: 51  Temp: 97.8 F (36.6 C)  Resp: 12    Intact pulses distally Incision: dressing C/D/I HV drains in and will be removed this afternoon  LABS  Recent Labs  09/14/12 0425  HGB 11.7*  HCT 35.1*  WBC 11.5*  PLT 156     Recent Labs  09/14/12 0425  NA 136  K 4.6  BUN 26*  CREATININE 1.22  GLUCOSE 112*    No results found for this basename: LABPT, INR,  in the last 72 hours   Assessment/Plan: 1 Day Post-Op Procedure(s) (LRB): TOTAL KNEE BILATERAL (Bilateral)   Advance diet Up with therapy Discharge to [facility] Thursday  Will get epidural out today, foley out, start Xarelto 6 hours later

## 2012-09-14 NOTE — Progress Notes (Signed)
Utilization review completed.  

## 2012-09-14 NOTE — Progress Notes (Signed)
Physical Therapy Treatment Patient Details Name: [name] [name] MRN: [id number] DOB: 10/23/1935 Today's Date: 09/14/2012 Time: 4098-1191 PT Time Calculation (min): 39 min  PT Assessment / Plan / Recommendation Comments on Treatment Session  Pt able to take several small steps from bed to chair this afternoon and perform bed exercises.  Pt with R knee flex to 90 deg and L knee flex to approx 80 deg.      Follow Up Recommendations  SNF;Supervision/Assistance - 24 hour     Does the patient have the potential to tolerate intense rehabilitation     Barriers to Discharge        Equipment Recommendations  None recommended by PT    Recommendations for Other Services    Frequency 7X/week   Plan Discharge plan remains appropriate    Precautions / Restrictions Precautions Precautions: Knee Required Braces or Orthoses: Knee Immobilizer - Left Knee Immobilizer - Left: Discontinue once straight leg raise with < 10 degree lag Restrictions Weight Bearing Restrictions: No Other Position/Activity Restrictions: WBAT BLE   Pertinent Vitals/Pain 2/10 pain, pt had been premedicated.     Mobility  Bed Mobility Bed Mobility: Supine to Sit Supine to Sit: 4: Min guard Details for Bed Mobility Assistance: Min/guard for LLE out of bed.  Pt doing well with UE placement to self assist.  Transfers Transfers: Sit to Stand;Stand to Sit;Stand Pivot Transfers Sit to Stand: 1: +2 Total assist;From elevated surface;With upper extremity assist;From bed Sit to Stand: Patient Percentage: 60% Stand to Sit: 1: +2 Total assist;With upper extremity assist;With armrests;To chair/3-in-1 Stand to Sit: Patient Percentage: 50% Stand Pivot Transfers: 1: +2 Total assist Stand Pivot Transfers: Patient Percentage: 60% Details for Transfer Assistance: Assist to rise, stabalize and ensure controlled descent with cues for hand placement and LE management.  Pt able to take a few steps from bed to chair this afternoon, however  still with c/o dizziness.  BP was 79/54 at lowest, however with exercises, increased to 94/60 Ambulation/Gait Ambulation/Gait Assistance: Not tested (comment)    Exercises Total Joint Exercises Ankle Circles/Pumps: AROM;Both;20 reps Quad Sets: AROM;Both;10 reps Heel Slides: AROM;Both;10 reps Hip ABduction/ADduction: AAROM;Both;10 reps Straight Leg Raises: AAROM;Both;10 reps   PT Diagnosis:    PT Problem List:   PT Treatment Interventions:     PT Goals Acute Rehab PT Goals PT Goal Formulation: With patient Time For Goal Achievement: 09/21/12 Potential to Achieve Goals: Good Pt will go Supine/Side to Sit: with supervision PT Goal: Supine/Side to Sit - Progress: Progressing toward goal Pt will go Sit to Stand: with supervision PT Goal: Sit to Stand - Progress: Progressing toward goal Pt will go Stand to Sit: with supervision PT Goal: Stand to Sit - Progress: Progressing toward goal Pt will Ambulate: 51 - 150 feet;with supervision;with least restrictive assistive device PT Goal: Ambulate - Progress: Progressing toward goal Pt will Perform Home Exercise Program: with supervision, verbal cues required/provided PT Goal: Perform Home Exercise Program - Progress: Progressing toward goal  Visit Information  Last PT Received On: 09/14/12 Assistance Needed: +2    Subjective Data  Subjective: I'm ready to try again.  Patient Stated Goal: to get back to PLOF.    Cognition  Cognition Arousal/Alertness: Awake/alert Behavior During Therapy: WFL for tasks assessed/performed Overall Cognitive Status: Within Functional Limits for tasks assessed    Balance     End of Session PT - End of Session Equipment Utilized During Treatment: Gait belt;Left knee immobilizer Activity Tolerance: Patient tolerated treatment well (some dizziness) Patient  left: in chair;with call bell/phone within reach;with family/visitor present Nurse Communication: Mobility status CPM Right Knee CPM Right Knee: Off    GP     Vista Deck 09/14/2012, 5:52 PM

## 2012-09-14 NOTE — Progress Notes (Signed)
Clinical Social Work Department BRIEF PSYCHOSOCIAL ASSESSMENT 09/14/2012  Patient:  [name] [name], [name] [name]     Account Number:  [id number]     Admit date:  09/13/2012  Clinical Social Worker:  Candie Chroman  Date/Time:  09/14/2012 02:12 PM  Referred by:  Physician  Date Referred:  09/14/2012 Referred for  SNF Placement   Other Referral:   Interview type:  Patient Other interview type:    PSYCHOSOCIAL DATA Living Status:  WIFE Admitted from facility:   Level of care:   Primary support name:  Joyce Gross Primary support relationship to patient:  SPOUSE Degree of support available:   supportive    CURRENT CONCERNS Current Concerns  Post-Acute Placement   Other Concerns:    SOCIAL WORK ASSESSMENT / PLAN Pt is a 77 yr old gentleman living at home prior to hospitalization. CSW met with pt to assist with d/c planning. ST Rehab will be needed following hospital d/c. Pt has made prior arrangements to have his rehab at [facility]. CSW has contacted SNF and d/c plans have been confirmed. CSW will continue to follow to assist with d/c planning to SNF.   Assessment/plan status:  Psychosocial Support/Ongoing Assessment of Needs Other assessment/ plan:   Information/referral to community resources:   None needed at this time.    PATIENT'S/FAMILY'S RESPONSE TO PLAN OF CARE: Pt is looking forward to having rehab at [facility].   Cori Razor LCSW [phone]

## 2012-09-15 DIAGNOSIS — E663 Overweight: Secondary | ICD-10-CM

## 2012-09-15 DIAGNOSIS — D500 Iron deficiency anemia secondary to blood loss (chronic): Secondary | ICD-10-CM

## 2012-09-15 LAB — CBC
HCT: 28.3 % — ABNORMAL LOW (ref 39.0–52.0)
MCV: 91.3 fL (ref 78.0–100.0)
RBC: 3.1 MIL/uL — ABNORMAL LOW (ref 4.22–5.81)
WBC: 10.9 10*3/uL — ABNORMAL HIGH (ref 4.0–10.5)

## 2012-09-15 LAB — BASIC METABOLIC PANEL
BUN: 25 mg/dL — ABNORMAL HIGH (ref 6–23)
CO2: 26 mEq/L (ref 19–32)
Chloride: 108 mEq/L (ref 96–112)
Creatinine, Ser: 1.22 mg/dL (ref 0.50–1.35)

## 2012-09-15 MED ORDER — VITAMINS A & D EX OINT
TOPICAL_OINTMENT | CUTANEOUS | Status: AC
  Administered 2012-09-15: 20:00:00
  Filled 2012-09-15: qty 5

## 2012-09-15 NOTE — Progress Notes (Signed)
Physical Therapy Treatment Patient Details Name: [name] [name] MRN: [id number] DOB: 07-02-1935 Today's Date: 09/15/2012 Time: 0932-1003 PT Time Calculation (min): 31 min  PT Assessment / Plan / Recommendation Comments on Treatment Session  Pt able to ambulate in hallway this am very well with no dizziness/nausea.     Follow Up Recommendations  SNF;Supervision/Assistance - 24 hour     Does the patient have the potential to tolerate intense rehabilitation     Barriers to Discharge        Equipment Recommendations  None recommended by PT    Recommendations for Other Services    Frequency 7X/week   Plan Discharge plan remains appropriate    Precautions / Restrictions Precautions Precautions: Knee Required Braces or Orthoses: Knee Immobilizer - Left Knee Immobilizer - Left: Discontinue once straight leg raise with < 10 degree lag Restrictions Weight Bearing Restrictions: No Other Position/Activity Restrictions: WBAT BLE   Pertinent Vitals/Pain 3/10 pain, ice packs applied    Mobility  Bed Mobility Bed Mobility: Supine to Sit Supine to Sit: 5: Supervision;HOB elevated Details for Bed Mobility Assistance: Pt able to self assist LEs out of bed with HOB elevated.   Transfers Transfers: Sit to Stand;Stand to Sit Sit to Stand: 1: +2 Total assist;From elevated surface;With upper extremity assist;From bed Sit to Stand: Patient Percentage: 60% Stand to Sit: 1: +2 Total assist;With upper extremity assist;With armrests;To chair/3-in-1 Stand to Sit: Patient Percentage: 70% Details for Transfer Assistance: Assist to rise and steady as pt initally had LOB backwards.  He did much better today with sitting and was able to walk BLEs out when sitting.  Ambulation/Gait Ambulation/Gait Assistance: 1: +2 Total assist Ambulation/Gait: Patient Percentage: 70% Ambulation Distance (Feet): 50 Feet Assistive device: Rolling walker Ambulation/Gait Assistance Details: cues for sequencing/technique  with RW and to maintain upright posture.  Again, maintained KI on LLE.  Gait Pattern: Step-to pattern;Decreased stride length;Antalgic    Exercises Total Joint Exercises Ankle Circles/Pumps: AROM;Both;20 reps Quad Sets: AROM;Both;10 reps Heel Slides: AROM;Both;10 reps Hip ABduction/ADduction: AAROM;Both;10 reps Straight Leg Raises: AAROM;Both;10 reps   PT Diagnosis:    PT Problem List:   PT Treatment Interventions:     PT Goals Acute Rehab PT Goals PT Goal Formulation: With patient Time For Goal Achievement: 09/21/12 Potential to Achieve Goals: Good Pt will go Supine/Side to Sit: with modified independence PT Goal: Supine/Side to Sit - Progress: Updated due to goal met Pt will go Sit to Stand: with supervision PT Goal: Sit to Stand - Progress: Progressing toward goal Pt will go Stand to Sit: with supervision PT Goal: Stand to Sit - Progress: Progressing toward goal Pt will Ambulate: 51 - 150 feet;with supervision;with least restrictive assistive device PT Goal: Ambulate - Progress: Progressing toward goal Pt will Perform Home Exercise Program: with supervision, verbal cues required/provided PT Goal: Perform Home Exercise Program - Progress: Progressing toward goal  Visit Information  Last PT Received On: 09/15/12 Assistance Needed: +2    Subjective Data  Subjective: I'm so glad I was able to do that (walk in hallway) Patient Stated Goal: to get back to PLOF.    Cognition  Cognition Arousal/Alertness: Awake/alert Behavior During Therapy: WFL for tasks assessed/performed Overall Cognitive Status: Within Functional Limits for tasks assessed    Balance     End of Session PT - End of Session Equipment Utilized During Treatment: Gait belt;Left knee immobilizer Activity Tolerance: Patient tolerated treatment well Patient left: in chair;with call bell/phone within reach;with family/visitor present Nurse Communication: Mobility status  CPM Left Knee CPM Left Knee: Off CPM  Right Knee CPM Right Knee: Off   GP     Vista Deck 09/15/2012, 11:39 AM

## 2012-09-15 NOTE — Progress Notes (Addendum)
Physical Therapy Treatment Patient Details Name: [name] [name] MRN: [id number] DOB: 1935/07/30 Today's Date: 09/15/2012 Time: 7829-5621 PT Time Calculation (min): 16 min  PT Assessment / Plan / Recommendation Comments on Treatment Session  Pt progressing extremely well with all mobility and will be ready for D/C to [facility] tomorrow.     Follow Up Recommendations  SNF;Supervision/Assistance - 24 hour     Does the patient have the potential to tolerate intense rehabilitation     Barriers to Discharge        Equipment Recommendations  None recommended by PT    Recommendations for Other Services    Frequency 7X/week   Plan Discharge plan remains appropriate    Precautions / Restrictions Precautions Precautions: Knee Required Braces or Orthoses: Knee Immobilizer - Left Knee Immobilizer - Left: Discontinue once straight leg raise with < 10 degree lag Restrictions Weight Bearing Restrictions: No Other Position/Activity Restrictions: WBAT BLE   Pertinent Vitals/Pain Pt with min c/o pain, ice packs applied R Knee flex ROM: 88, L knee flex ROM: 84    Mobility  Bed Mobility Bed Mobility: Supine to Sit Supine to Sit: HOB elevated;6: Modified independent (Device/Increase time) Sit to Supine: 5: Supervision;HOB flat Details for Bed Mobility Assistance: Pt able to self assist LEs out of bed with HOB elevated.   Transfers Transfers: Sit to Stand;Stand to Sit Sit to Stand: 1: +2 Total assist;From elevated surface;With upper extremity assist;From bed Sit to Stand: Patient Percentage: 70% Stand to Sit: 1: +2 Total assist;With upper extremity assist;With armrests;To chair/3-in-1 Stand to Sit: Patient Percentage: 70% Details for Transfer Assistance: Pt with better balance when standing this pm. Continues to require cues for hand placement and safety.  Ambulation/Gait Ambulation/Gait Assistance: 1: +2 Total assist Ambulation/Gait: Patient Percentage: 80% Ambulation Distance (Feet): 100  Feet Assistive device: Rolling walker Ambulation/Gait Assistance Details: Min cues for upright posture and sequencing/technique.  Gait Pattern: Step-to pattern;Decreased stride length;Antalgic    Exercises     PT Diagnosis:    PT Problem List:   PT Treatment Interventions:     PT Goals Acute Rehab PT Goals PT Goal Formulation: With patient Time For Goal Achievement: 09/21/12 Potential to Achieve Goals: Good Pt will go Supine/Side to Sit: with modified independence PT Goal: Supine/Side to Sit - Progress: Met Pt will go Sit to Supine/Side: with modified independence PT Goal: Sit to Supine/Side - Progress: Updated due to goal met Pt will go Sit to Stand: with supervision PT Goal: Sit to Stand - Progress: Progressing toward goal Pt will go Stand to Sit: with supervision PT Goal: Stand to Sit - Progress: Progressing toward goal Pt will Ambulate: 51 - 150 feet;with supervision;with least restrictive assistive device PT Goal: Ambulate - Progress: Progressing toward goal  Visit Information  Last PT Received On: 09/15/12 Assistance Needed: +2    Subjective Data  Subjective: I'm going to [facility] tomorrow Patient Stated Goal: to get back to PLOF.    Cognition  Cognition Arousal/Alertness: Awake/alert Behavior During Therapy: WFL for tasks assessed/performed Overall Cognitive Status: Within Functional Limits for tasks assessed    Balance     End of Session PT - End of Session Equipment Utilized During Treatment: Gait belt;Left knee immobilizer Activity Tolerance: Patient tolerated treatment well Patient left: in chair;with call bell/phone within reach;with family/visitor present Nurse Communication: Mobility status CPM Left Knee CPM Left Knee: On CPM Right Knee CPM Right Knee: Off   GP     Vista Deck 09/15/2012, 5:17 PM

## 2012-09-15 NOTE — Progress Notes (Signed)
   Subjective: 2 Days Post-Op Procedure(s) (LRB): TOTAL KNEE BILATERAL (Bilateral)   Patient reports pain as mild, pain well controlled. No events throughout the night.   Objective:   VITALS:   Filed Vitals:   09/15/12   BP: 100/51  Pulse: 59  Temp: 97.7 F (36.5 C)   Resp: 16    Neurovascular intact Dorsiflexion/Plantar flexion intact Incision: dressing C/D/I No cellulitis present Compartment soft  LABS  Recent Labs  09/14/12 0425 09/15/12 0435  HGB 11.7* 9.2*  HCT 35.1* 28.3*  WBC 11.5* 10.9*  PLT 156 133*     Recent Labs  09/14/12 0425 09/15/12 0435  NA 136 137  K 4.6 4.4  BUN 26* 25*  CREATININE 1.22 1.22  GLUCOSE 112* 118*     Assessment/Plan: 2 Days Post-Op Procedure(s) (LRB): TOTAL KNEE BILATERAL (Bilateral)  Up with therapy Discharge to SNF [facility]) eventually, when ready  Expected ABLA  Treated with iron and will observe  Overweight (BMI 25-29.9) Estimated body mass index is 29.56 kg/(m^2) as calculated from the following:   Height as of this encounter: 6' (1.829 m).   Weight as of this encounter: 98.884 kg (218 lb). Patient also counseled that weight may inhibit the healing process Patient counseled that losing weight will help with future health issues       Anastasio Auerbach. Emet Rafanan   PAC  09/15/2012, 9:52 AM

## 2012-09-16 DIAGNOSIS — I959 Hypotension, unspecified: Secondary | ICD-10-CM | POA: Diagnosis not present

## 2012-09-16 DIAGNOSIS — M25569 Pain in unspecified knee: Secondary | ICD-10-CM | POA: Diagnosis not present

## 2012-09-16 DIAGNOSIS — R0602 Shortness of breath: Secondary | ICD-10-CM | POA: Diagnosis not present

## 2012-09-16 DIAGNOSIS — R9431 Abnormal electrocardiogram [ECG] [EKG]: Secondary | ICD-10-CM | POA: Diagnosis not present

## 2012-09-16 DIAGNOSIS — R0989 Other specified symptoms and signs involving the circulatory and respiratory systems: Secondary | ICD-10-CM | POA: Diagnosis not present

## 2012-09-16 DIAGNOSIS — D500 Iron deficiency anemia secondary to blood loss (chronic): Secondary | ICD-10-CM | POA: Diagnosis not present

## 2012-09-16 DIAGNOSIS — S8990XA Unspecified injury of unspecified lower leg, initial encounter: Secondary | ICD-10-CM | POA: Diagnosis not present

## 2012-09-16 DIAGNOSIS — R55 Syncope and collapse: Secondary | ICD-10-CM | POA: Diagnosis not present

## 2012-09-16 DIAGNOSIS — Z471 Aftercare following joint replacement surgery: Secondary | ICD-10-CM | POA: Diagnosis not present

## 2012-09-16 DIAGNOSIS — D72829 Elevated white blood cell count, unspecified: Secondary | ICD-10-CM | POA: Diagnosis not present

## 2012-09-16 DIAGNOSIS — R404 Transient alteration of awareness: Secondary | ICD-10-CM | POA: Diagnosis not present

## 2012-09-16 DIAGNOSIS — R269 Unspecified abnormalities of gait and mobility: Secondary | ICD-10-CM | POA: Diagnosis not present

## 2012-09-16 DIAGNOSIS — M6281 Muscle weakness (generalized): Secondary | ICD-10-CM | POA: Diagnosis not present

## 2012-09-16 DIAGNOSIS — I10 Essential (primary) hypertension: Secondary | ICD-10-CM | POA: Diagnosis not present

## 2012-09-16 DIAGNOSIS — E860 Dehydration: Secondary | ICD-10-CM | POA: Diagnosis not present

## 2012-09-16 DIAGNOSIS — Z96659 Presence of unspecified artificial knee joint: Secondary | ICD-10-CM | POA: Diagnosis not present

## 2012-09-16 DIAGNOSIS — I119 Hypertensive heart disease without heart failure: Secondary | ICD-10-CM | POA: Diagnosis not present

## 2012-09-16 MED ORDER — CELECOXIB 200 MG PO CAPS: 200.0000 mg | ORAL_CAPSULE | Freq: Two times a day (BID) | ORAL | Status: DC

## 2012-09-16 MED ORDER — ASPIRIN EC 325 MG PO TBEC: 325.0000 mg | DELAYED_RELEASE_TABLET | Freq: Two times a day (BID) | ORAL | Status: DC

## 2012-09-16 MED ORDER — POLYETHYLENE GLYCOL 3350 17 G PO PACK: 17.0000 g | PACK | Freq: Two times a day (BID) | ORAL | Status: DC

## 2012-09-16 MED ORDER — OXYCODONE HCL 15 MG PO TABS: 15.0000 mg | ORAL_TABLET | ORAL | Status: DC | PRN

## 2012-09-16 MED ORDER — FERROUS SULFATE 325 (65 FE) MG PO TABS: 325.0000 mg | ORAL_TABLET | Freq: Three times a day (TID) | ORAL | Status: DC

## 2012-09-16 MED ORDER — TIZANIDINE HCL 4 MG PO CAPS: 4.0000 mg | ORAL_CAPSULE | Freq: Three times a day (TID) | ORAL | Status: DC

## 2012-09-16 MED ORDER — DSS 100 MG PO CAPS: 100.0000 mg | ORAL_CAPSULE | Freq: Two times a day (BID) | ORAL | Status: DC

## 2012-09-16 MED ORDER — RIVAROXABAN 10 MG PO TABS: 10.0000 mg | ORAL_TABLET | ORAL | Status: DC

## 2012-09-16 NOTE — Discharge Summary (Signed)
Physician Discharge Summary  Patient ID: [name] [name] MRN: [id number] DOB/AGE: [birth year] 77 y.o.  Admit date: 09/13/2012 Discharge date:  09/16/2012  Procedures:  Procedure(s) (LRB): TOTAL KNEE BILATERAL (Bilateral)  Attending Physician:  Dr. Durene Romans   Admission Diagnoses:   Bilateral knee OA / pain  Discharge Diagnoses:  Principal Problem:   S/P bilateral TKA Active Problems:   Expected blood loss anemia   Overweight (BMI 25.0-29.9)  Past Medical History  Diagnosis Date  . Hypertension   . History of paroxysmal supraventricular tachycardia     [name]  . Vertigo, benign positional     [name]  . Palpitations     History of ventricular ectopy. He is status post radiofrequency ablation at the [facility] in the early 90's. for PVC's  . BPH (benign prostatic hyperplasia)     s/p TURP in July 2012  . Aortic sclerosis     MILD PER ECHO AS WELL AS TRACE MITRAL REGURGITATION - PER CARDIOLOGY OFFICE NOTES 06/2012  . EKG abnormalities     INCOMPLETE RIGHT BUNDLE BRANCH BLOCK AND CHRONIC T-WAVE ABNORMALITIES - PER CARDIOLOGY OFFICE NOTES 06/2012  . Complication of anesthesia     HX OF MINOR ITCHING AFTER ANESTHESIA FOR HIP REPLACEMENT AND FOR COLON SURGERY  . GERD (gastroesophageal reflux disease)   . Arthritis     HPI:    [name] [name], 77 y.o. male, has a history of pain and functional disability in the bilaterally knee due to arthritis and has failed non-surgical conservative treatments for greater than 12 weeks to includeNSAID's and/or analgesics, corticosteriod injections and activity modification. Onset of symptoms was gradual, starting 2 years ago with rapidlly worsening course since that time. The patient noted prior procedures on the knee to include arthroscopy on the left knee(s). Patient currently rates pain in the bilaterally knee(s) at 8 out of 10 with activity. Patient has  worsening of pain with activity and weight bearing, pain that interferes with activities of daily living, pain with passive range of motion, crepitus and joint swelling. Patient has evidence of periarticular osteophytes and joint space narrowing by imaging studies. There is no active infection. Risks, benefits and expectations were discussed with the patient. Patient understand the risks, benefits and expectations and wishes to proceed with surgery.   PCP: Pearla Dubonnet, MD   Discharged Condition: good  Hospital Course:  Patient underwent the above stated procedure on 09/13/2012. Patient tolerated the procedure well and brought to the recovery room in good condition and subsequently to the floor.  POD #1 BP: 94/59 ; Pulse: 51 ; Temp: 97.8 F (36.6 C) ; Resp: 12  Pt's foley was removed, as well as the hemovac drain removed. IV was changed to a saline lock. Patient reports pain as mild. At this point doing well as epidural still in place. Otherwise no events overnight. Dorsiflexion/plantar flexion intact, incision: dressing C/D/I, no cellulitis present and compartment soft.   LABS  Basename  09/14/12    0425   HGB  11.7  HCT  35.1   POD #2  BP: 100/51 ; Pulse: 59 ; Temp: 97.7 F (36.5 C) ; Resp: 16  Patient reports pain as mild, pain well controlled. No events throughout the night. Epidural was removed and anticoagulation was started 6 hours after. Neurovascular intact, dorsiflexion/plantar flexion intact, incision: dressing C/D/I, no cellulitis present and compartment soft.   LABS  Basename  09/15/12   0435  HGB  9.2  HCT  28.3   POD #3  BP: 118/72 ; Pulse: 70 ; Temp: 98.4 F (36.9 C) ; Resp: 16  Patient reports pain as mild, pain well controlled. No events throughout the night. Ready to be discharged to SNF. Neurovascular intact, dorsiflexion/plantar flexion intact, incision: dressing C/D/I, no cellulitis present and compartment soft.   LABS   No new labs  Discharge  Exam: General appearance: alert, cooperative and no distress Extremities: Homans sign is negative, no sign of DVT, no edema, redness or tenderness in the calves or thighs and no ulcers, gangrene or trophic changes  Disposition: SNF with follow up in 2 weeks   Follow-up Information   Follow up with [name], MD. Schedule an appointment as soon as possible for a visit in 2 weeks.   Contact information:   [address] [place]ah Baller [place] [address] [phone]       Discharge Orders   Future Appointments Provider Department Dept Phone   11/05/2012 11:00 AM Cassell Clement, MD Medical [facility] Main Office [place]) [phone]   Future Orders Complete By Expires     Call MD / Call 911  As directed     Comments:      If you experience chest pain or shortness of breath, CALL 911 and be transported to the hospital emergency room.  If you develope a fever above 101 F, pus (white drainage) or increased drainage or redness at the wound, or calf pain, call your surgeon's office.    Change dressing  As directed     Comments:      Maintain surgical dressing for 10-14 days, then change the dressing daily with sterile 4 x 4 inch gauze dressing and tape. Keep the area dry and clean.    Constipation Prevention  As directed     Comments:      Drink plenty of fluids.  Prune juice may be helpful.  You may use a stool softener, such as Colace (over the counter) 100 mg twice a day.  Use MiraLax (over the counter) for constipation as needed.    Diet - low sodium heart healthy  As directed     Discharge instructions  As directed     Comments:      Maintain surgical dressing for 10-14 days, then replace with gauze and tape. Keep the area dry and clean until follow up. Follow up in 2 weeks at [facility]. Call with any questions or concerns.    Driving restrictions  As directed     Comments:      No driving for 4 weeks    For Home Use Only DME CPM  As directed     Questions:       Laterality:  Left Knee    Length of Need:  4 weeks    Starting Flexion:  0    Ending Flexion:  60    Increase by Daily:  10    Surgery Date:  09/13/2012    CPM Started:  09/16/2012    For Home Use Only DME CPM  As directed     Questions:      Laterality:  Right Knee    Length of Need:  4 weeks    Starting Flexion:  0    Ending Flexion:  60    Increase by Daily:  10    Surgery Date:  09/13/2012    CPM Started:  09/16/2012  Increase activity slowly as tolerated  As directed     TED hose  As directed     Comments:      Use stockings (TED hose) for 2 weeks on both leg(s).  You may remove them at night for sleeping.    Weight bearing as tolerated  As directed          Medication List    STOP taking these medications       aspirin 81 MG tablet      TAKE these medications       aspirin EC 325 MG tablet  Take 1 tablet (325 mg total) by mouth 2 (two) times daily.     celecoxib 200 MG capsule  Commonly known as:  CELEBREX  Take 1 capsule (200 mg total) by mouth every 12 (twelve) hours.     DSS 100 MG Caps  Take 100 mg by mouth 2 (two) times daily.     ferrous sulfate 325 (65 FE) MG tablet  Take 1 tablet (325 mg total) by mouth 3 (three) times daily after meals.     lansoprazole 30 MG capsule  Commonly known as:  PREVACID  Take 30 mg by mouth daily.     metoprolol succinate 50 MG 24 hr tablet  Commonly known as:  TOPROL-XL  Take 50 mg by mouth daily before breakfast. Take with or immediately following a meal.     MICARDIS HCT 80-12.5 MG per tablet  Generic drug:  telmisartan-hydrochlorothiazide  Take 1 tablet by mouth daily before breakfast.     oxyCODONE 15 MG immediate release tablet  Commonly known as:  ROXICODONE  Take 1 tablet (15 mg total) by mouth every 4 (four) hours as needed for pain.     polyethylene glycol packet  Commonly known as:  MIRALAX / GLYCOLAX  Take 17 g by mouth 2 (two) times daily.     potassium chloride 10 MEQ tablet  Commonly known as:   K-DUR  Take 10 mEq by mouth 3 (three) times daily.     potassium citrate 10 MEQ (1080 MG) SR tablet  Commonly known as:  UROCIT-K  Take 10 mEq by mouth 3 (three) times daily with meals.     rivaroxaban 10 MG Tabs tablet  Commonly known as:  XARELTO  Take 1 tablet (10 mg total) by mouth daily.     tiZANidine 4 MG capsule  Commonly known as:  ZANAFLEX  Take 1 capsule (4 mg total) by mouth 3 (three) times daily. Muscle spasms     VITAMIN D PO  Take 1,000 mg by mouth daily.         Signed: Anastasio Auerbach. Stefani Baik   PAC  09/16/2012, 11:25 AM

## 2012-09-16 NOTE — Progress Notes (Signed)
Physical Therapy Treatment Patient Details Name: [name] [name] MRN: [id number] DOB: November 04, 1935 Today's Date: 09/16/2012 Time: 0865-7846 PT Time Calculation (min): 52 min  PT Assessment / Plan / Recommendation Comments on Treatment Session  Pt continues to progress very well with all mobility and exercises.  Ready for D/C to [facility] today.     Follow Up Recommendations  SNF;Supervision/Assistance - 24 hour     Does the patient have the potential to tolerate intense rehabilitation     Barriers to Discharge        Equipment Recommendations  None recommended by PT    Recommendations for Other Services    Frequency 7X/week   Plan Discharge plan remains appropriate    Precautions / Restrictions Precautions Precautions: Knee Precaution Comments: Pt able to ambulate without either KI with good quad control.  Restrictions Weight Bearing Restrictions: No Other Position/Activity Restrictions: WBAT BLE   Pertinent Vitals/Pain 6/10 pain, RN made aware and pain meds given, ice packs applied    Mobility  Bed Mobility Bed Mobility: Supine to Sit Supine to Sit: 6: Modified independent (Device/Increase time) Transfers Transfers: Sit to Stand;Stand to Sit Sit to Stand: 4: Min assist;From elevated surface;With upper extremity assist;From bed Stand to Sit: 4: Min assist;With upper extremity assist;With armrests;To chair/3-in-1 Details for Transfer Assistance: Performed x 2 in order to use 3in1 in restroom with cues for hand placement and LE management.  Ambulation/Gait Ambulation/Gait Assistance: 4: Min assist Ambulation Distance (Feet): 160 Feet Assistive device: Rolling walker Gait Pattern: Step-to pattern;Decreased stride length;Antalgic General Gait Details: Pt did intermittently perform step through gait pattern.  Stairs: No    Exercises Total Joint Exercises Ankle Circles/Pumps: AROM;Both;20 reps Quad Sets: AROM;Both;10 reps Heel Slides: AROM;Both;10 reps Hip ABduction/ADduction:  AAROM;Both;10 reps Straight Leg Raises: AAROM;Both;10 reps Goniometric ROM: 86 deg flex on R, 82 deg flex on L   PT Diagnosis:    PT Problem List:   PT Treatment Interventions:     PT Goals Acute Rehab PT Goals PT Goal Formulation: With patient Time For Goal Achievement: 09/21/12 Potential to Achieve Goals: Good Pt will go Supine/Side to Sit: with modified independence PT Goal: Supine/Side to Sit - Progress: Met Pt will go Sit to Stand: with supervision PT Goal: Sit to Stand - Progress: Progressing toward goal Pt will go Stand to Sit: with supervision PT Goal: Stand to Sit - Progress: Progressing toward goal Pt will Ambulate: 51 - 150 feet;with supervision;with least restrictive assistive device PT Goal: Ambulate - Progress: Progressing toward goal Pt will Perform Home Exercise Program: with supervision, verbal cues required/provided PT Goal: Perform Home Exercise Program - Progress: Met  Visit Information  Last PT Received On: 09/16/12 Assistance Needed: +1    Subjective Data  Subjective: I'm a little stiff today.  Patient Stated Goal: to get back to PLOF.    Cognition  Cognition Arousal/Alertness: Awake/alert Behavior During Therapy: WFL for tasks assessed/performed Overall Cognitive Status: Within Functional Limits for tasks assessed    Balance     End of Session PT - End of Session Equipment Utilized During Treatment: Gait belt Activity Tolerance: Patient tolerated treatment well Patient left: in chair;with call bell/phone within reach;with family/visitor present Nurse Communication: Mobility status CPM Left Knee CPM Left Knee: Off CPM Right Knee CPM Right Knee: Off   GP     Vista Deck 09/16/2012, 10:09 AM

## 2012-09-16 NOTE — Progress Notes (Signed)
Clinical Social Work Department CLINICAL SOCIAL WORK PLACEMENT NOTE 09/16/2012  Patient:  [name] [name], [name] [name]  Account Number:  [id number] Admit date:  09/13/2012  Clinical Social Worker:  Cori Razor, LCSW  Date/time:  09/14/2012 02:20 PM  Clinical Social Work is seeking post-discharge placement for this patient at the following level of care:   SKILLED NURSING   (*CSW will update this form in Epic as items are completed)     Patient/family provided with [facility] Department of Clinical Social Work's list of facilities offering this level of care within the geographic area requested by the patient (or if unable, by the patient's family).  09/14/2012  Patient/family informed of their freedom to choose among providers that offer the needed level of care, that participate in Medicare, Medicaid or managed care program needed by the patient, have an available bed and are willing to accept the patient.    Patient/family informed of [facility]' ownership interest in [facility], as well as of the fact that they are under no obligation to receive care at this facility.  PASARR submitted to EDS on 09/13/2012 PASARR number received from EDS on 09/13/2012  FL2 transmitted to all facilities in geographic area requested by pt/family on  09/14/2012 FL2 transmitted to all facilities within larger geographic area on   Patient informed that his/her managed care company has contracts with or will negotiate with  certain facilities, including the following:     Patient/family informed of bed offers received:  09/14/2012 Patient chooses bed at [facility] Physician recommends and patient chooses bed at    Patient to be transferred to [facility] on  09/16/2012 Patient to be transferred to facility by [facility]  The following physician request were entered in Epic:   Additional Comments:  Cori Razor LCSW [phone]

## 2012-09-16 NOTE — Progress Notes (Signed)
   Subjective: 3 Days Post-Op Procedure(s) (LRB): TOTAL KNEE BILATERAL (Bilateral)   Patient reports pain as mild, pain well controlled. No events throughout the night. Ready to be discharged to SNF.  Objective:   VITALS:   Filed Vitals:   09/16/12 0605  BP: 118/72  Pulse: 70  Temp: 98.4 F (36.9 C)  Resp: 16    Neurovascular intact Dorsiflexion/Plantar flexion intact Incision: dressing C/D/I No cellulitis present Compartment soft  LABS  Recent Labs  09/14/12 0425 09/15/12 0435  HGB 11.7* 9.2*  HCT 35.1* 28.3*  WBC 11.5* 10.9*  PLT 156 133*     Recent Labs  09/14/12 0425 09/15/12 0435  NA 136 137  K 4.6 4.4  BUN 26* 25*  CREATININE 1.22 1.22  GLUCOSE 112* 118*     Assessment/Plan: 3 Days Post-Op Procedure(s) (LRB): TOTAL KNEE BILATERAL (Bilateral) Up with therapy Discharge to SNF Follow up in 2 weeks at [facility]. Follow up with OLIN,Annebelle Bostic D in 2 weeks.  Contact information:  [facility] [address] [place] [address] [phone]    Overweight (BMI 25-29.9)  Estimated body mass index is 29.56 kg/(m^2) as calculated from the following:     Height as of this encounter: 6' (1.829 m).     Weight as of this encounter: 98.884 kg (218 lb).  Patient also counseled that weight may inhibit the healing process  Patient counseled that losing weight will help with future health issues      [name] [name]   PAC  09/16/2012, 11:11 AM

## 2012-09-17 ENCOUNTER — Other Ambulatory Visit: Payer: Self-pay | Admitting: *Deleted

## 2012-09-17 MED ORDER — OXYCODONE HCL 5 MG PO TABS: ORAL_TABLET | ORAL | Status: DC

## 2012-09-17 MED ORDER — HYDROCODONE-ACETAMINOPHEN 7.5-325 MG PO TABS: ORAL_TABLET | ORAL | Status: DC

## 2012-09-17 MED FILL — Scopolamine TD Patch 72HR 1 MG/3DAYS: TRANSDERMAL | Qty: 1 | Status: AC

## 2012-09-18 ENCOUNTER — Inpatient Hospital Stay (HOSPITAL_COMMUNITY)
Admission: EM | Admit: 2012-09-18 | Discharge: 2012-09-21 | DRG: 312 | Disposition: A | Discharge status: Skilled Nursing Facility | Payer: Medicare Other | Attending: Internal Medicine | Admitting: Internal Medicine

## 2012-09-18 ENCOUNTER — Emergency Department (HOSPITAL_COMMUNITY): Payer: Medicare Other

## 2012-09-18 ENCOUNTER — Encounter (HOSPITAL_COMMUNITY): Payer: Self-pay | Admitting: Emergency Medicine

## 2012-09-18 ENCOUNTER — Telehealth: Payer: Self-pay | Admitting: Cardiology

## 2012-09-18 DIAGNOSIS — D500 Iron deficiency anemia secondary to blood loss (chronic): Secondary | ICD-10-CM

## 2012-09-18 DIAGNOSIS — Z96659 Presence of unspecified artificial knee joint: Secondary | ICD-10-CM

## 2012-09-18 DIAGNOSIS — R9431 Abnormal electrocardiogram [ECG] [EKG]: Secondary | ICD-10-CM | POA: Diagnosis not present

## 2012-09-18 DIAGNOSIS — N183 Chronic kidney disease, stage 3 unspecified: Secondary | ICD-10-CM | POA: Diagnosis present

## 2012-09-18 DIAGNOSIS — R55 Syncope and collapse: Secondary | ICD-10-CM | POA: Diagnosis not present

## 2012-09-18 DIAGNOSIS — R0609 Other forms of dyspnea: Secondary | ICD-10-CM | POA: Diagnosis not present

## 2012-09-18 DIAGNOSIS — R410 Disorientation, unspecified: Secondary | ICD-10-CM | POA: Diagnosis present

## 2012-09-18 DIAGNOSIS — I959 Hypotension, unspecified: Secondary | ICD-10-CM | POA: Diagnosis present

## 2012-09-18 DIAGNOSIS — I471 Supraventricular tachycardia, unspecified: Secondary | ICD-10-CM | POA: Diagnosis present

## 2012-09-18 DIAGNOSIS — E860 Dehydration: Secondary | ICD-10-CM | POA: Diagnosis present

## 2012-09-18 DIAGNOSIS — R0989 Other specified symptoms and signs involving the circulatory and respiratory systems: Secondary | ICD-10-CM | POA: Diagnosis not present

## 2012-09-18 DIAGNOSIS — T40605A Adverse effect of unspecified narcotics, initial encounter: Secondary | ICD-10-CM | POA: Diagnosis present

## 2012-09-18 DIAGNOSIS — Z96653 Presence of artificial knee joint, bilateral: Secondary | ICD-10-CM

## 2012-09-18 DIAGNOSIS — Z7901 Long term (current) use of anticoagulants: Secondary | ICD-10-CM

## 2012-09-18 DIAGNOSIS — I1310 Hypertensive heart and chronic kidney disease without heart failure, with stage 1 through stage 4 chronic kidney disease, or unspecified chronic kidney disease: Secondary | ICD-10-CM | POA: Diagnosis present

## 2012-09-18 DIAGNOSIS — F19988 Other psychoactive substance use, unspecified with other psychoactive substance-induced disorder: Secondary | ICD-10-CM | POA: Diagnosis present

## 2012-09-18 DIAGNOSIS — R404 Transient alteration of awareness: Secondary | ICD-10-CM | POA: Diagnosis not present

## 2012-09-18 DIAGNOSIS — I951 Orthostatic hypotension: Secondary | ICD-10-CM | POA: Diagnosis present

## 2012-09-18 DIAGNOSIS — I119 Hypertensive heart disease without heart failure: Secondary | ICD-10-CM

## 2012-09-18 DIAGNOSIS — Z79899 Other long term (current) drug therapy: Secondary | ICD-10-CM

## 2012-09-18 DIAGNOSIS — F29 Unspecified psychosis not due to a substance or known physiological condition: Secondary | ICD-10-CM | POA: Diagnosis present

## 2012-09-18 DIAGNOSIS — I10 Essential (primary) hypertension: Secondary | ICD-10-CM

## 2012-09-18 DIAGNOSIS — D62 Acute posthemorrhagic anemia: Secondary | ICD-10-CM | POA: Diagnosis present

## 2012-09-18 DIAGNOSIS — K219 Gastro-esophageal reflux disease without esophagitis: Secondary | ICD-10-CM | POA: Diagnosis present

## 2012-09-18 LAB — COMPREHENSIVE METABOLIC PANEL
AST: 34 U/L (ref 0–37)
Albumin: 2.7 g/dL — ABNORMAL LOW (ref 3.5–5.2)
BUN: 31 mg/dL — ABNORMAL HIGH (ref 6–23)
Calcium: 8.4 mg/dL (ref 8.4–10.5)
Creatinine, Ser: 1.47 mg/dL — ABNORMAL HIGH (ref 0.50–1.35)

## 2012-09-18 LAB — RAPID URINE DRUG SCREEN, HOSP PERFORMED
Amphetamines: NOT DETECTED
Benzodiazepines: NOT DETECTED
Opiates: POSITIVE — AB

## 2012-09-18 LAB — PREPARE RBC (CROSSMATCH)

## 2012-09-18 LAB — URINALYSIS, ROUTINE W REFLEX MICROSCOPIC
Glucose, UA: NEGATIVE mg/dL
Hgb urine dipstick: NEGATIVE
Leukocytes, UA: NEGATIVE
Protein, ur: NEGATIVE mg/dL
Specific Gravity, Urine: 1.016 (ref 1.005–1.030)
Urobilinogen, UA: 0.2 mg/dL (ref 0.0–1.0)

## 2012-09-18 LAB — CBC WITH DIFFERENTIAL/PLATELET
Basophils Absolute: 0 10*3/uL (ref 0.0–0.1)
Basophils Relative: 0 % (ref 0–1)
Eosinophils Absolute: 0.3 10*3/uL (ref 0.0–0.7)
Eosinophils Relative: 4 % (ref 0–5)
HCT: 26 % — ABNORMAL LOW (ref 39.0–52.0)
MCH: 31 pg (ref 26.0–34.0)
MCHC: 33.8 g/dL (ref 30.0–36.0)
MCV: 91.5 fL (ref 78.0–100.0)
Monocytes Absolute: 0.8 10*3/uL (ref 0.1–1.0)
Monocytes Relative: 10 % (ref 3–12)
RDW: 13.4 % (ref 11.5–15.5)

## 2012-09-18 LAB — D-DIMER, QUANTITATIVE: D-Dimer, Quant: 1.58 ug/mL-FEU — ABNORMAL HIGH (ref 0.00–0.48)

## 2012-09-18 LAB — POCT I-STAT, CHEM 8
Creatinine, Ser: 1.5 mg/dL — ABNORMAL HIGH (ref 0.50–1.35)
HCT: 24 % — ABNORMAL LOW (ref 39.0–52.0)
Hemoglobin: 8.2 g/dL — ABNORMAL LOW (ref 13.0–17.0)
Potassium: 4.2 mEq/L (ref 3.5–5.1)
Sodium: 136 mEq/L (ref 135–145)
TCO2: 24 mmol/L (ref 0–100)

## 2012-09-18 LAB — GLUCOSE, CAPILLARY: Glucose-Capillary: 103 mg/dL — ABNORMAL HIGH (ref 70–99)

## 2012-09-18 LAB — MRSA PCR SCREENING: MRSA by PCR: NEGATIVE

## 2012-09-18 LAB — PROTIME-INR: Prothrombin Time: 15 seconds (ref 11.6–15.2)

## 2012-09-18 LAB — LACTIC ACID, PLASMA: Lactic Acid, Venous: 1.1 mmol/L (ref 0.5–2.2)

## 2012-09-18 IMAGING — CR DG CHEST 1V PORT
1 series · 1 of 1 positions shown · non-contrast
Comparison: [DATE]

CLINICAL DATA: Syncope.  Tachycardia.

PORTABLE CHEST - 1 VIEW

[AP]
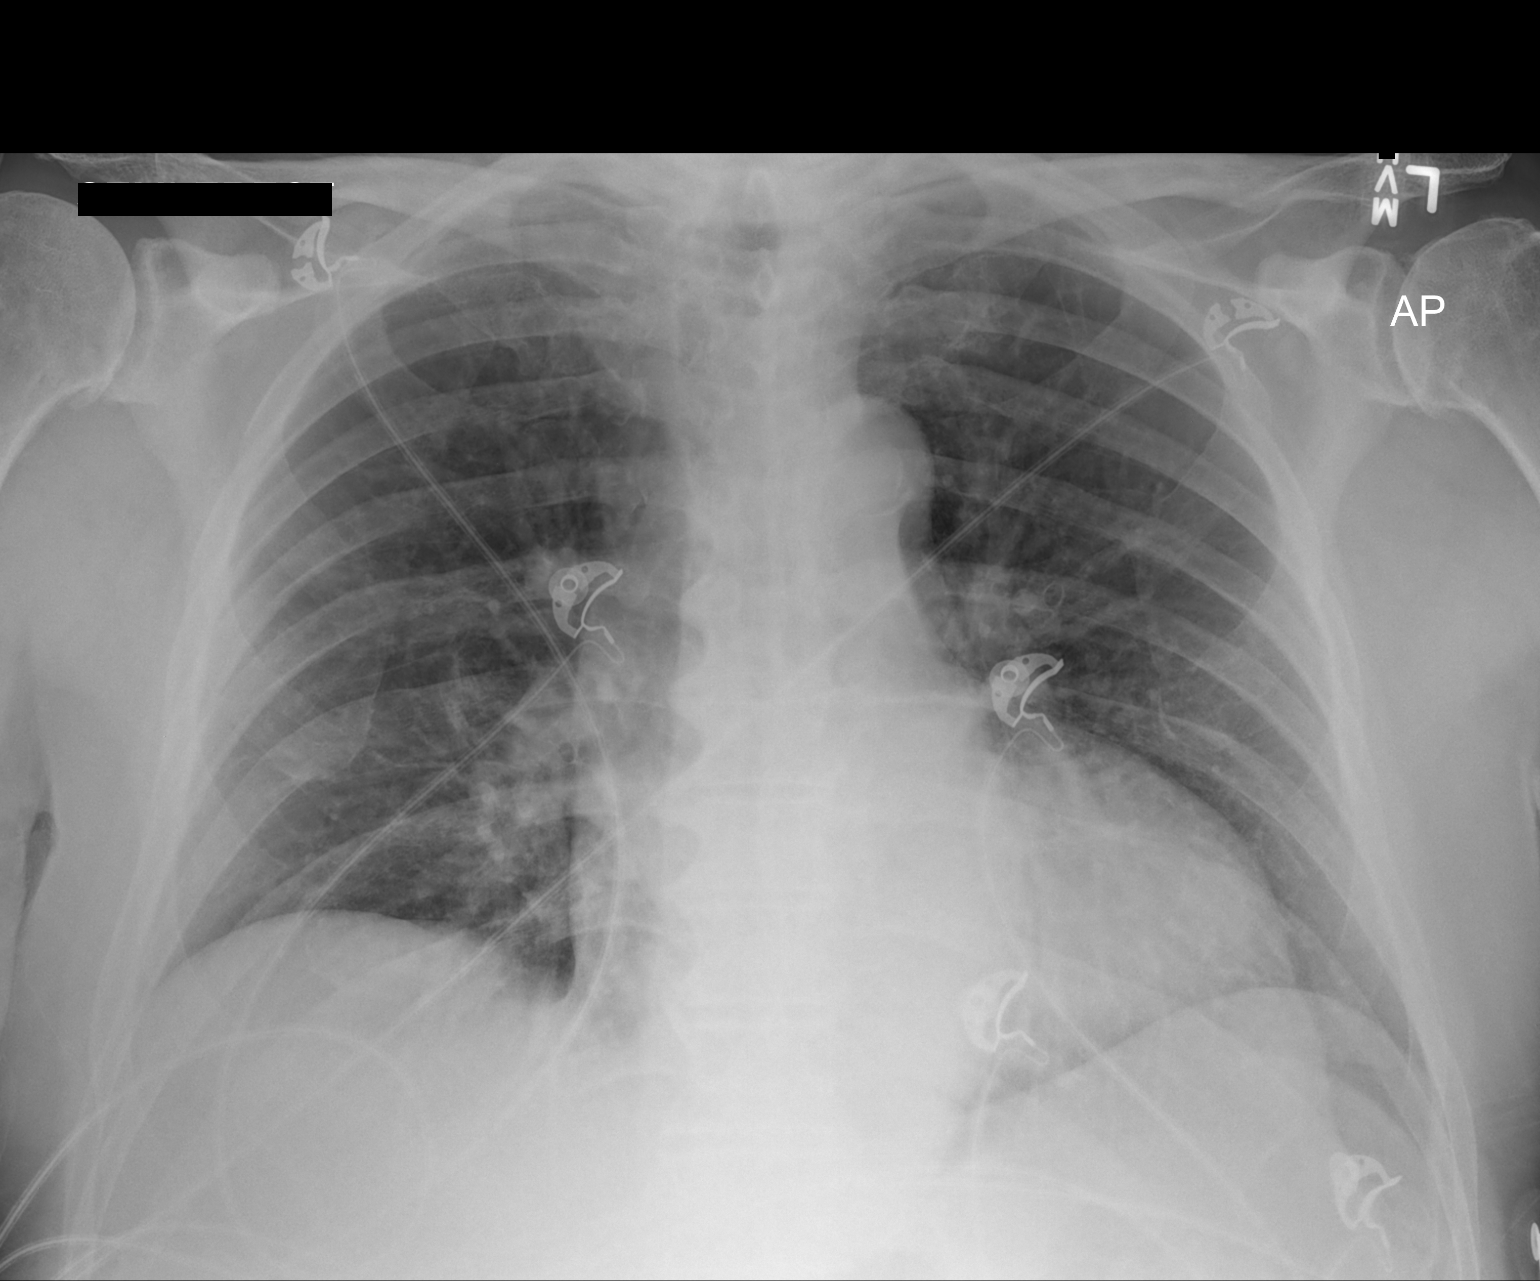

[1 of 1 positions shown; findings below may reference images not displayed]

FINDINGS: [D5] hours. Cardiopericardial silhouette is at upper
limits of normal for size. No evidence for pulmonary edema or focal
airspace consolidation.  No pleural effusion. Imaged bony
structures of the thorax are intact.  Degenerative changes are
noted in both shoulders. Telemetry leads overlie the chest.
IMPRESSION: Borderline cardiomegaly with vascular congestion.

## 2012-09-18 IMAGING — CT CT ANGIO CHEST
1 of 2 series · 19 of 32 positions shown · IV contrast (omnipaque)
Comparison: None.

CLINICAL DATA: Syncope, recent surgery, dyspnea

CT ANGIOGRAPHY CHEST
TECHNIQUE: Multidetector CT imaging of the chest using the
standard protocol during bolus administration of intravenous
contrast. Multiplanar reconstructed images including MIPs were
obtained and reviewed to evaluate the vascular anatomy.
Contrast: 100mL OMNIPAQUE IOHEXOL 350 MG/ML SOLN

[Series 5: thins for pacs · axial · 0.73mm/px · z∈[+1186,+1464]mm · 19 of 306 slices shown]
[im 14/306  lung]
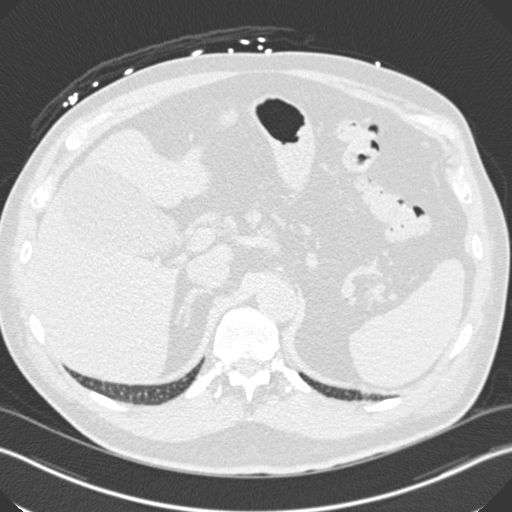
[im 27/306  soft-tissue]
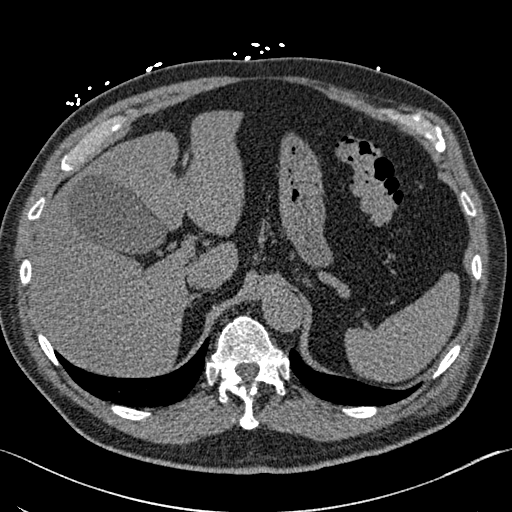
[im 40/306  lung]
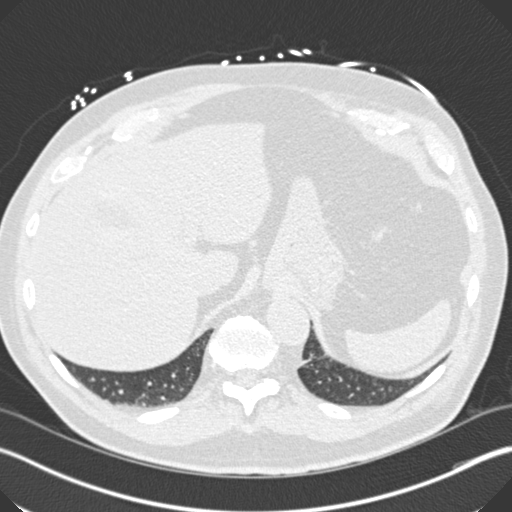
[im 67/306  soft-tissue]
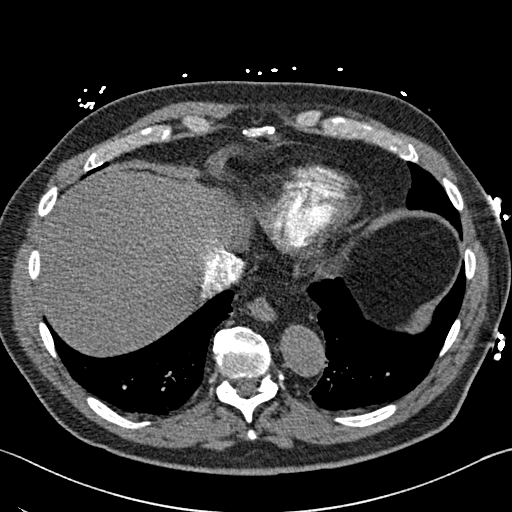
[im 80/306  lung]
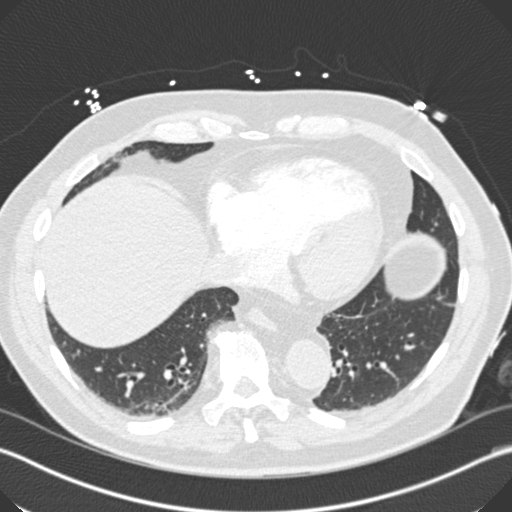
[im 93/306  soft-tissue]
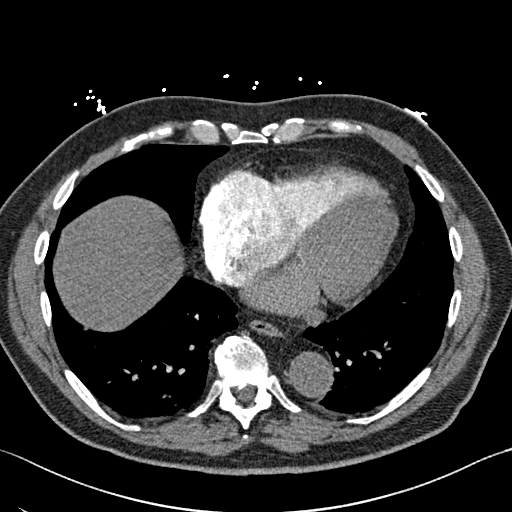
[im 107/306  lung]
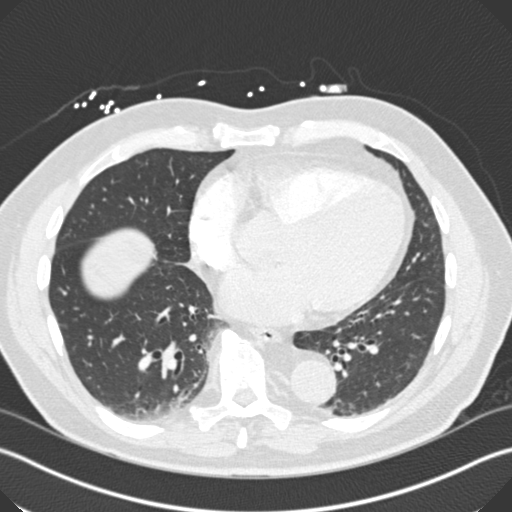
[im 120/306  soft-tissue]
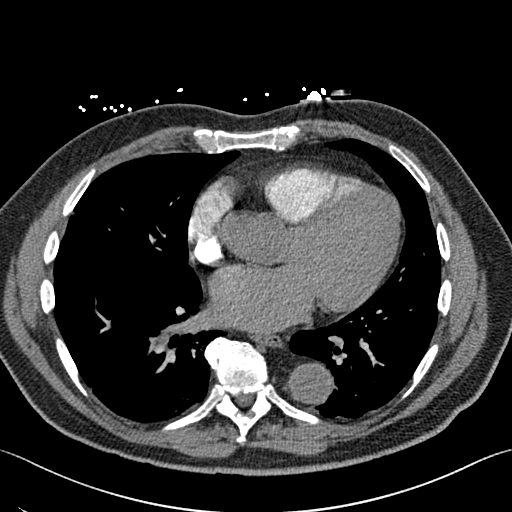
[im 133/306  lung]
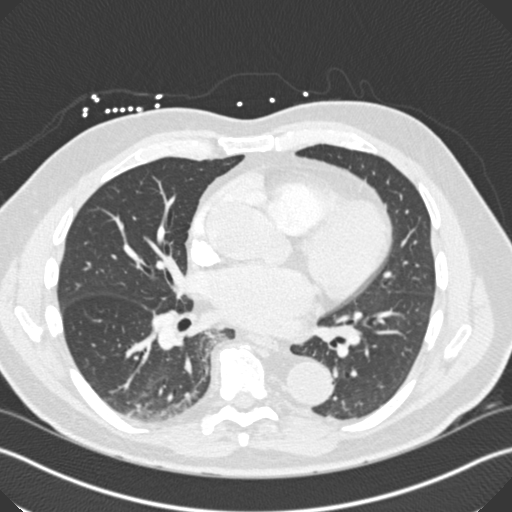
[im 160/306  soft-tissue]
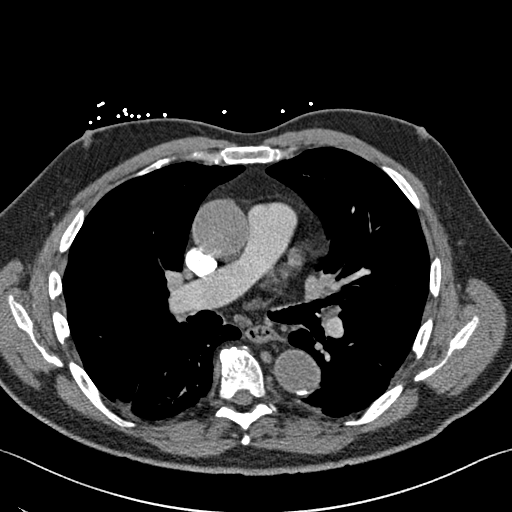
[im 173/306  lung]
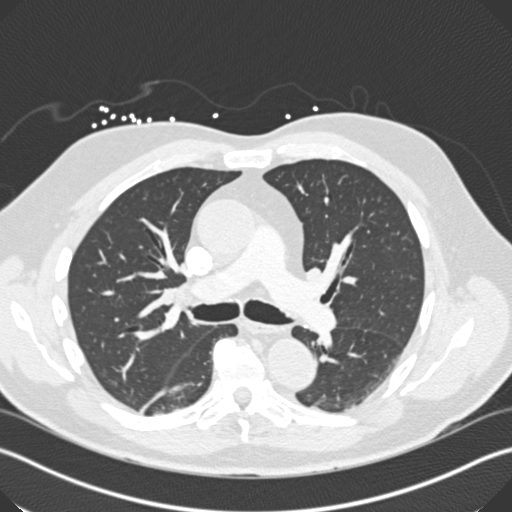
[im 186/306  soft-tissue]
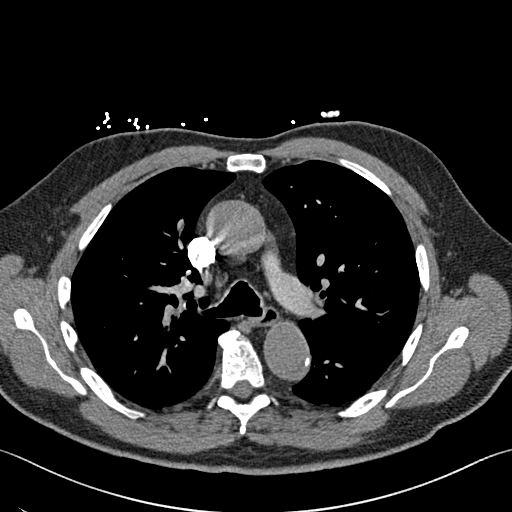
[im 199/306  lung]
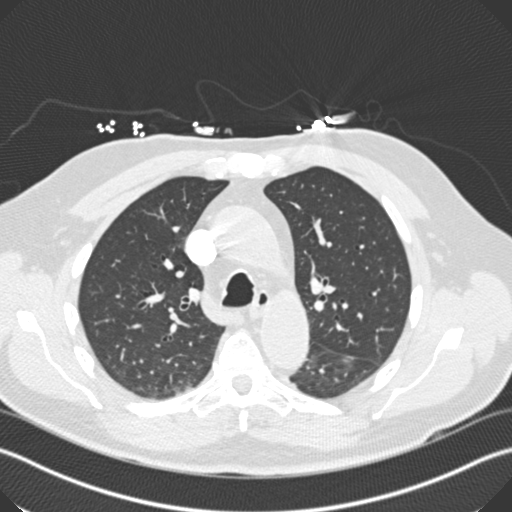
[im 213/306  soft-tissue]
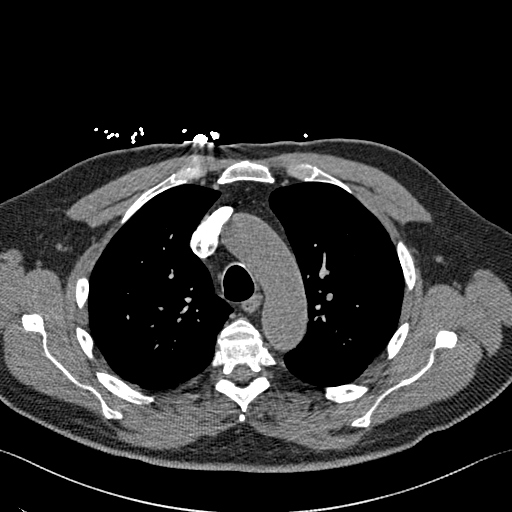
[im 226/306  lung]
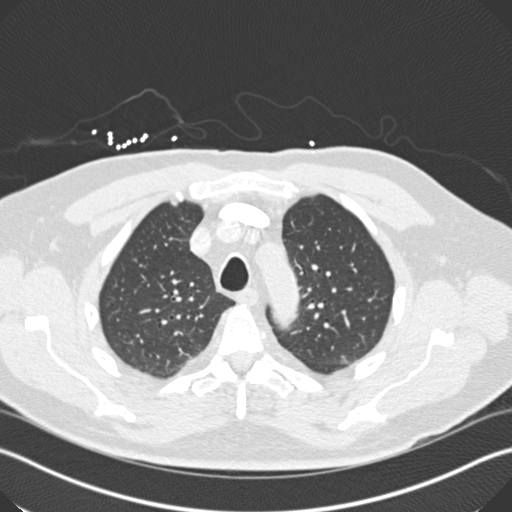
[im 239/306  soft-tissue]
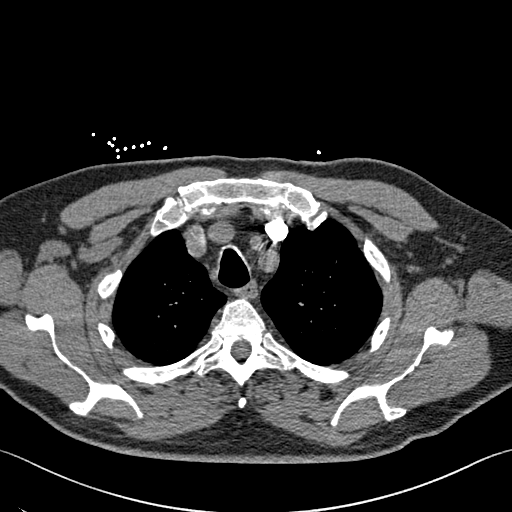
[im 266/306  lung]
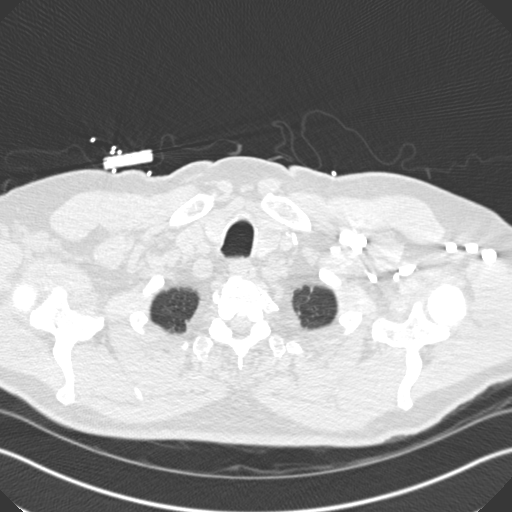
[im 279/306  soft-tissue]
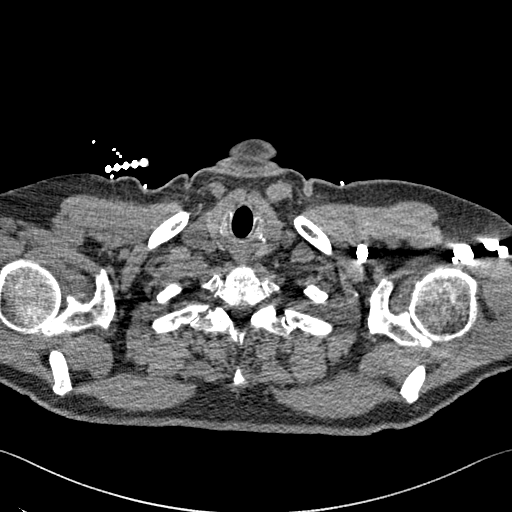
[im 292/306  lung]
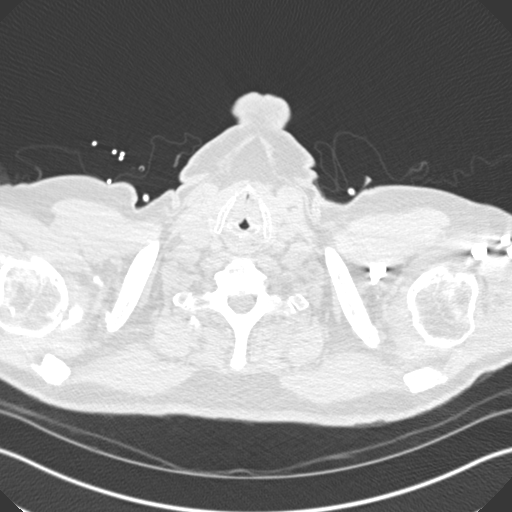

[19 of 32 positions shown; findings below may reference images not displayed]

FINDINGS: Small hiatal hernia is noted.  Sagittal images of the
spine shows degenerative changes thoracic spine.
A distended gallbladder is noted without evidence of calcified
gallstones.  Adrenal glands are unremarkable.

No central pulmonary embolus is noted.  There is suboptimal
contrast opacification of the sub segmental arterial branches.  No
mediastinal hematoma or adenopathy. Atherosclerotic calcifications
of the coronary arteries.  The heart size is within normal limits.
Trace pericardial effusion or thickening anteriorly.

Images of the lung parenchyma shows no acute infiltrate or
pulmonary edema. Mild dependent atelectasis noted posteriorly.
IMPRESSION: 1.  No central pulmonary embolus is noted.
2.  Small hiatal hernia.
3.  No acute infiltrate or pulmonary edema.
4.  Distended gallbladder without evidence of calcified gallstones.
5.  Degenerative changes thoracic spine.

## 2012-09-18 MED ORDER — PANTOPRAZOLE SODIUM 40 MG PO TBEC
40.0000 mg | DELAYED_RELEASE_TABLET | Freq: Every day | ORAL | Status: DC
  Administered 2012-09-18 – 2012-09-21 (×4): 40 mg via ORAL
  Filled 2012-09-18 (×3): qty 1

## 2012-09-18 MED ORDER — ALBUTEROL SULFATE (5 MG/ML) 0.5% IN NEBU: 2.5000 mg | INHALATION_SOLUTION | RESPIRATORY_TRACT | Status: DC | PRN

## 2012-09-18 MED ORDER — IOHEXOL 350 MG/ML SOLN
100.0000 mL | Freq: Once | INTRAVENOUS | Status: AC | PRN
  Administered 2012-09-18: 100 mL via INTRAVENOUS

## 2012-09-18 MED ORDER — ONDANSETRON HCL 4 MG PO TABS: 4.0000 mg | ORAL_TABLET | Freq: Four times a day (QID) | ORAL | Status: DC | PRN

## 2012-09-18 MED ORDER — TIZANIDINE HCL 2 MG PO TABS
4.0000 mg | ORAL_TABLET | Freq: Three times a day (TID) | ORAL | Status: DC
  Administered 2012-09-18 – 2012-09-20 (×4): 4 mg via ORAL
  Filled 2012-09-18 (×11): qty 2

## 2012-09-18 MED ORDER — CELECOXIB 200 MG PO CAPS
200.0000 mg | ORAL_CAPSULE | Freq: Two times a day (BID) | ORAL | Status: DC
  Administered 2012-09-19 – 2012-09-21 (×5): 200 mg via ORAL
  Filled 2012-09-18 (×7): qty 1

## 2012-09-18 MED ORDER — POLYETHYLENE GLYCOL 3350 17 G PO PACK
17.0000 g | PACK | Freq: Two times a day (BID) | ORAL | Status: DC
  Filled 2012-09-18 (×7): qty 1

## 2012-09-18 MED ORDER — TRAMADOL HCL 50 MG PO TABS
50.0000 mg | ORAL_TABLET | Freq: Four times a day (QID) | ORAL | Status: DC
  Administered 2012-09-18 – 2012-09-19 (×3): 50 mg via ORAL
  Filled 2012-09-18 (×7): qty 1

## 2012-09-18 MED ORDER — FERROUS SULFATE 325 (65 FE) MG PO TABS
325.0000 mg | ORAL_TABLET | Freq: Three times a day (TID) | ORAL | Status: DC
  Administered 2012-09-18 – 2012-09-21 (×7): 325 mg via ORAL
  Filled 2012-09-18 (×11): qty 1

## 2012-09-18 MED ORDER — ACETAMINOPHEN 650 MG RE SUPP: 650.0000 mg | Freq: Four times a day (QID) | RECTAL | Status: DC | PRN

## 2012-09-18 MED ORDER — RIVAROXABAN 10 MG PO TABS
10.0000 mg | ORAL_TABLET | Freq: Every day | ORAL | Status: DC
  Administered 2012-09-18 – 2012-09-21 (×4): 10 mg via ORAL
  Filled 2012-09-18 (×5): qty 1

## 2012-09-18 MED ORDER — ONDANSETRON HCL 4 MG/2ML IJ SOLN
4.0000 mg | Freq: Four times a day (QID) | INTRAMUSCULAR | Status: DC | PRN
  Administered 2012-09-19: 4 mg via INTRAVENOUS
  Filled 2012-09-18: qty 2

## 2012-09-18 MED ORDER — SODIUM CHLORIDE 0.9 % IV SOLN
1000.0000 mL | Freq: Once | INTRAVENOUS | Status: AC
  Administered 2012-09-18: 1000 mL via INTRAVENOUS

## 2012-09-18 MED ORDER — DOCUSATE SODIUM 100 MG PO CAPS
100.0000 mg | ORAL_CAPSULE | Freq: Two times a day (BID) | ORAL | Status: DC
  Filled 2012-09-18 (×7): qty 1

## 2012-09-18 MED ORDER — SODIUM CHLORIDE 0.9 % IV SOLN
INTRAVENOUS | Status: AC
  Administered 2012-09-18: 15:00:00 via INTRAVENOUS

## 2012-09-18 MED ORDER — ACETAMINOPHEN 325 MG PO TABS: 650.0000 mg | ORAL_TABLET | Freq: Four times a day (QID) | ORAL | Status: DC | PRN

## 2012-09-18 MED ORDER — SODIUM CHLORIDE 0.9 % IJ SOLN
3.0000 mL | Freq: Two times a day (BID) | INTRAMUSCULAR | Status: DC
  Administered 2012-09-18 – 2012-09-20 (×4): 3 mL via INTRAVENOUS

## 2012-09-18 MED ORDER — SODIUM CHLORIDE 0.9 % IV BOLUS (SEPSIS)
500.0000 mL | Freq: Once | INTRAVENOUS | Status: AC
  Administered 2012-09-18: 500 mL via INTRAVENOUS

## 2012-09-18 MED ORDER — VITAMIN D3 25 MCG (1000 UNIT) PO TABS
1000.0000 [IU] | ORAL_TABLET | Freq: Every day | ORAL | Status: DC
  Administered 2012-09-18 – 2012-09-20 (×3): 1000 [IU] via ORAL
  Filled 2012-09-18 (×4): qty 1

## 2012-09-18 NOTE — ED Provider Notes (Signed)
History     CSN: [id number]  Arrival date & time 09/18/12  6213   First MD Initiated Contact with Patient 09/18/12 [phone]      Chief Complaint  Patient presents with  . Weakness  . Loss of Consciousness  . Knee Pain  . Shortness of Breath    (Consider location/radiation/quality/duration/timing/severity/associated sxs/prior treatment) HPI  Patient brought in via ems from rehab facility where he has been for two days after discharge s/p bilateral knee replacement 5 days ago.  Patient reportedly got up and ambulated to shower with walker.  On way back to bed became weak, light headed and had syncopal event- no staff reported with patient.  Patient states he has some dyspnea and doesn't feel well.  States this has been "going of for a while" and why hasn't his doctor been notified.  EMS in room and report from rehab patient had been doing ok until this episode just prior to arrival.    Past Medical History  Diagnosis Date  . Hypertension   . History of paroxysmal supraventricular tachycardia     [name]  . Vertigo, benign positional     [name]  . Palpitations     History of ventricular ectopy. He is status post radiofrequency ablation at the [facility] in the early 90's. for PVC's  . BPH (benign prostatic hyperplasia)     s/p TURP in July 2012  . Aortic sclerosis     MILD PER ECHO AS WELL AS TRACE MITRAL REGURGITATION - PER CARDIOLOGY OFFICE NOTES 06/2012  . EKG abnormalities     INCOMPLETE RIGHT BUNDLE BRANCH BLOCK AND CHRONIC T-WAVE ABNORMALITIES - PER CARDIOLOGY OFFICE NOTES 06/2012  . Complication of anesthesia     HX OF MINOR ITCHING AFTER ANESTHESIA FOR HIP REPLACEMENT AND FOR COLON SURGERY  . GERD (gastroesophageal reflux disease)   . Arthritis     Past Surgical History  Procedure Laterality Date  . Hernia repair  1990  . Ankle surgery    . Tonsillectomy  Age 40  . Kidney stone  surgery    . US echocardiography  06-20-2009    Est EF 55-60%  . Cardiac catheterization  2002    NORMAL CORONARY ARTERIES  . Joint replacement  ? 2009    LEFT HIP REPLACEMENT  . Colon surgery  ? 1994  . Transurethral resection of prostate    . Total knee arthroplasty Bilateral 09/13/2012    Procedure: TOTAL KNEE BILATERAL;  Surgeon: [name], MD;  Location: [facility];  Service: Orthopedics;  Laterality: Bilateral;    History reviewed. No pertinent family history.  History  Substance Use Topics  . Smoking status: Never Smoker   . Smokeless tobacco: Not on file  . Alcohol Use: Yes     Comment: Drink Wine almost daily      Review of Systems  All other systems reviewed and are negative.    Allergies  Ramipril  Home Medications   Current Outpatient Rx  Name  Route  Sig  Dispense  Refill  . aspirin EC 325 MG tablet   Oral   Take 1 tablet (325 mg total) by mouth 2 (two) times daily.   60 tablet   0     Start the day after finishing the Xarelto.   . celecoxib (CELEBREX) 200 MG capsule   Oral   Take 1 capsule (200 mg total) by mouth  every 12 (twelve) hours.   60 capsule   0   . Cholecalciferol (VITAMIN D PO)   Oral   Take 1,000 mg by mouth daily.          Marland Kitchen docusate sodium 100 MG CAPS   Oral   Take 100 mg by mouth 2 (two) times daily.   10 capsule      . ferrous sulfate 325 (65 FE) MG tablet   Oral   Take 1 tablet (325 mg total) by mouth 3 (three) times daily after meals.         Marland Kitchen HYDROcodone-acetaminophen (NORCO) 7.5-325 MG per tablet      Take 1 tablet every 4 hours routinely for post surgical pain.   180 tablet   5   . lansoprazole (PREVACID) 30 MG capsule   Oral   Take 30 mg by mouth daily.          . metoprolol succinate (TOPROL-XL) 50 MG 24 hr tablet   Oral   Take 50 mg by mouth daily before breakfast. Take with or immediately following a meal.         . oxyCODONE (OXY IR/ROXICODONE) 5 MG immediate release tablet      Take 2  tablets every 4 hours. Take 1 tablet every 4 hours as needed   360 tablet   0   . oxyCODONE (ROXICODONE) 15 MG immediate release tablet   Oral   Take 1 tablet (15 mg total) by mouth every 4 (four) hours as needed for pain.   120 tablet   0   . polyethylene glycol (MIRALAX / GLYCOLAX) packet   Oral   Take 17 g by mouth 2 (two) times daily.   14 each      . potassium chloride (K-DUR) 10 MEQ tablet   Oral   Take 10 mEq by mouth 3 (three) times daily.         . potassium citrate (UROCIT-K) 10 MEQ (1080 MG) SR tablet   Oral   Take 10 mEq by mouth 3 (three) times daily with meals.         . rivaroxaban (XARELTO) 10 MG TABS tablet   Oral   Take 1 tablet (10 mg total) by mouth daily.   14 tablet   0   . telmisartan-hydrochlorothiazide (MICARDIS HCT) 80-12.5 MG per tablet   Oral   Take 1 tablet by mouth daily before breakfast.          . tiZANidine (ZANAFLEX) 4 MG capsule   Oral   Take 1 capsule (4 mg total) by mouth 3 (three) times daily. Muscle spasms   50 capsule   0     BP 125/62  Pulse 62  Temp(Src) 97.8 F (36.6 C) (Oral)  Resp 20  SpO2 100%  Physical Exam  Nursing note and vitals reviewed. Constitutional: He is oriented to person, place, and time. He appears well-nourished.  HENT:  Head: Normocephalic and atraumatic.  Right Ear: External ear normal.  Left Ear: External ear normal.  Mm dry  Eyes: Pupils are equal, round, and reactive to light.  Conjunctiva pale  Neck: Normal range of motion. Neck supple.  Cardiovascular: Normal rate, regular rhythm, normal heart sounds and intact distal pulses.   Pulmonary/Chest: Effort normal and breath sounds normal.  Abdominal: Soft. Bowel sounds are normal.  Musculoskeletal:  Bilateral anterior knee dressings in place.  Bilateral knee and lower extremity with moderate swelling, erythema, and ttp  Neurological: He is alert and  oriented to person, place, and time.  Skin: Skin is warm and dry.  Psychiatric: He is  agitated.    ED Course  Procedures (including critical care time)  Labs Reviewed  CBC WITH DIFFERENTIAL  COMPREHENSIVE METABOLIC PANEL  URINALYSIS, ROUTINE W REFLEX MICROSCOPIC  LACTIC ACID, PLASMA  URINE RAPID DRUG SCREEN (HOSP PERFORMED)  D-DIMER, QUANTITATIVE  PROTIME-INR   No results found.   No diagnosis found.   Date: 09/18/2012  Rate: 64  Rhythm: normal sinus rhythm  QRS Axis: left  Intervals: normal  ST/T Wave abnormalities: ST depressions anteriorly  Conduction Disutrbances:none  Narrative Interpretation:   Old EKG Reviewed: no significant change from 09/14/12     MDM  9:13 AM  Nurse reports he will not allow blood to be drawn and radiology tech reports he refuses x-rays.   Results for orders placed during the hospital encounter of 09/18/12  CBC WITH DIFFERENTIAL      Result Value Range   WBC 7.4  4.0 - 10.5 K/uL   RBC 2.84 (*) 4.22 - 5.81 MIL/uL   Hemoglobin 8.8 (*) 13.0 - 17.0 g/dL   HCT 16.1 (*) 09.6 - 04.5 %   MCV 91.5  78.0 - 100.0 fL   MCH 31.0  26.0 - 34.0 pg   MCHC 33.8  30.0 - 36.0 g/dL   RDW 40.9  81.1 - 91.4 %   Platelets 179  150 - 400 K/uL   Neutrophils Relative 73  43 - 77 %   Neutro Abs 5.5  1.7 - 7.7 K/uL   Lymphocytes Relative 12  12 - 46 %   Lymphs Abs 0.9  0.7 - 4.0 K/uL   Monocytes Relative 10  3 - 12 %   Monocytes Absolute 0.8  0.1 - 1.0 K/uL   Eosinophils Relative 4  0 - 5 %   Eosinophils Absolute 0.3  0.0 - 0.7 K/uL   Basophils Relative 0  0 - 1 %   Basophils Absolute 0.0  0.0 - 0.1 K/uL  COMPREHENSIVE METABOLIC PANEL      Result Value Range   Sodium 135  135 - 145 mEq/L   Potassium 4.2  3.5 - 5.1 mEq/L   Chloride 101  96 - 112 mEq/L   CO2 26  19 - 32 mEq/L   Glucose, Bld 108 (*) 70 - 99 mg/dL   BUN 31 (*) 6 - 23 mg/dL   Creatinine, Ser 7.82 (*) 0.50 - 1.35 mg/dL   Calcium 8.4  8.4 - 95.6 mg/dL   Total Protein 5.4 (*) 6.0 - 8.3 g/dL   Albumin 2.7 (*) 3.5 - 5.2 g/dL   AST 34  0 - 37 U/L   ALT 26  0 - 53 U/L    Alkaline Phosphatase 74  39 - 117 U/L   Total Bilirubin 0.8  0.3 - 1.2 mg/dL   GFR calc non Af Amer 45 (*) >90 mL/min   GFR calc Af Amer 52 (*) >90 mL/min  URINALYSIS, ROUTINE W REFLEX MICROSCOPIC      Result Value Range   Color, Urine YELLOW  YELLOW   APPearance CLEAR  CLEAR   Specific Gravity, Urine 1.016  1.005 - 1.030   pH 6.5  5.0 - 8.0   Glucose, UA NEGATIVE  NEGATIVE mg/dL   Hgb urine dipstick NEGATIVE  NEGATIVE   Bilirubin Urine NEGATIVE  NEGATIVE   Ketones, ur NEGATIVE  NEGATIVE mg/dL   Protein, ur NEGATIVE  NEGATIVE mg/dL   Urobilinogen, UA 0.2  0.0 - 1.0 mg/dL   Nitrite NEGATIVE  NEGATIVE   Leukocytes, UA NEGATIVE  NEGATIVE  LACTIC ACID, PLASMA      Result Value Range   Lactic Acid, Venous 1.1  0.5 - 2.2 mmol/L  URINE RAPID DRUG SCREEN (HOSP PERFORMED)      Result Value Range   Opiates POSITIVE (*) NONE DETECTED   Cocaine NONE DETECTED  NONE DETECTED   Benzodiazepines NONE DETECTED  NONE DETECTED   Amphetamines NONE DETECTED  NONE DETECTED   Tetrahydrocannabinol NONE DETECTED  NONE DETECTED   Barbiturates NONE DETECTED  NONE DETECTED  D-DIMER, QUANTITATIVE      Result Value Range   D-Dimer, Quant 1.58 (*) 0.00 - 0.48 ug/mL-FEU  PROTIME-INR      Result Value Range   Prothrombin Time 15.0  11.6 - 15.2 seconds   INR 1.20  0.00 - 1.49  GLUCOSE, CAPILLARY      Result Value Range   Glucose-Capillary 103 (*) 70 - 99 mg/dL  POCT I-STAT, CHEM 8      Result Value Range   Sodium 136  135 - 145 mEq/L   Potassium 4.2  3.5 - 5.1 mEq/L   Chloride 103  96 - 112 mEq/L   BUN 29 (*) 6 - 23 mg/dL   Creatinine, Ser 1.61 (*) 0.50 - 1.35 mg/dL   Glucose, Bld 096 (*) 70 - 99 mg/dL   Calcium, Ion 0.45  4.09 - 1.30 mmol/L   TCO2 24  0 - 100 mmol/L   Hemoglobin 8.2 (*) 13.0 - 17.0 g/dL   HCT 81.1 (*) 91.4 - 78.2 %   1- syncope- patient originally hypotensive per ems- no sign of sepsis here, ekg abnormal, but unchanged from first prior, and patient anemic and on betablocker.   2-  anemia-patient had bilateral knee replacement this week.  Post op hemoglobin 9, now 8.  Discussed with Dr. Waymon Amato and plan transfuse one unit prbc 3- abnormal ekg 4- elevated d-dimer- patient on xarelto and having pe study done.  CT angio without evidence of pe.     Hilario Quarry, MD 09/18/12 1250

## 2012-09-18 NOTE — ED Notes (Signed)
Pt refuses treatment until son arrives.

## 2012-09-18 NOTE — ED Notes (Signed)
Pt refused chest X-Ray, Investment banker, operational notified. Will wait until family arrives.

## 2012-09-18 NOTE — ED Notes (Signed)
PT had bilateral knee replacement on Thursday.  Complains of weakness, sob and syncope this am.

## 2012-09-18 NOTE — H&P (Signed)
[facility] History and Physical  [name] [name] JXB:[id number] DOB: [birth year] DOA: 09/18/2012  Referring physician: Dr. Margarita Grizzle, EDP PCP: Pearla Dubonnet, MD  OP Specialists:  1. Cardiology: Dr. Cassell Clement 2. Orthopedics: Dr. Durene Romans  Chief Complaint: Passed out.  HPI: [name] [name] is a 77 y.o. male with history of recent bilateral TKA on 09/13/12, expected ABLA, HTN, PSVT status post remote radiofrequency ablation and recurrence, GERD, stage III chronic kidney disease was transferred from [facility] to [facility] ED on 09/18/12 due to an episode of passing out. Patient was discharged to SNF on 09/16/12. There has been difficulty in controlling his bilateral knee pain because he has not been tolerating opioids. He complains of progressively worsening confusion despite medication changes recently. He complained of intermittent dizziness and lightheadedness on ambulating. He has been ambulating with the help of a walker. At approximately 6:30 a.m. on 09/18/12, after he had a back, he sat at the edge of his bed then felt dizzy, lightheaded and passed out falling back onto his bed. He may have passed out for a couple of minutes and regained consciousness. He denies any preceding palpitations, chest pain or headache. On waking up, he complained of dyspnea but no chest pain. He alerted the facility nursing staff and initial blood pressures were said to be 65/40 mmHg. After some time, he was eventually transferred to the ED. He recollects being confused on the ambulance and early on in the ED. His vital signs were stable, hemoglobin 8.8 down from 9.4 at discharge, creatinine 1.4, d-dimer positive, negative troponin x1, CT head negative and CTA chest negative for PE or acute findings. Patient was bolused with 1.5 L of IV fluids. Currently he indicates that he feels much better with no further dizziness, lightheadedness or dyspnea. He also states that he has not had significant  knee pain today. Per patient and family, confusion has resolved. Hospitalist admission was requested.   Review of Systems: All systems reviewed and apart from history of present illness, are negative. He indicates that his appetite has been good and he's been drinking and eating well. He denies any bleeding.  Past Medical History  Diagnosis Date  . Hypertension   . History of paroxysmal supraventricular tachycardia     [name]  . Vertigo, benign positional     [name]  . Palpitations     History of ventricular ectopy. He is status post radiofrequency ablation at the [facility] in the early 90's. for PVC's  . BPH (benign prostatic hyperplasia)     s/p TURP in July 2012  . Aortic sclerosis     MILD PER ECHO AS WELL AS TRACE MITRAL REGURGITATION - PER CARDIOLOGY OFFICE NOTES 06/2012  . EKG abnormalities     INCOMPLETE RIGHT BUNDLE BRANCH BLOCK AND CHRONIC T-WAVE ABNORMALITIES - PER CARDIOLOGY OFFICE NOTES 06/2012  . Complication of anesthesia     HX OF MINOR ITCHING AFTER ANESTHESIA FOR HIP REPLACEMENT AND FOR COLON SURGERY  . GERD (gastroesophageal reflux disease)   . Arthritis    Past Surgical History  Procedure Laterality Date  . Hernia repair  1990  . Ankle surgery    . Tonsillectomy  Age 78  . Kidney stone surgery    . US echocardiography  06-20-2009    Est EF 55-60%  . Cardiac catheterization  2002    NORMAL CORONARY ARTERIES  .  Joint replacement  ? 2009    LEFT HIP REPLACEMENT  . Colon surgery  ? 1994  . Transurethral resection of prostate    . Total knee arthroplasty Bilateral 09/13/2012    Procedure: TOTAL KNEE BILATERAL;  Surgeon: [name], MD;  Location: [facility];  Service: Orthopedics;  Laterality: Bilateral;   Social History:  reports that he has never smoked. He does not have any smokeless tobacco history on file. He reports that  drinks alcohol. He reports that he does not  use illicit drugs. Married. Currently at SNF for rehabilitation but usually lives with his wife. Independent of activities of daily living.  Allergies  Allergen Reactions  . Ramipril     Cough    Family History  Problem Relation Age of Onset  . Other Neg Hx    Negative family history  Prior to Admission medications   Medication Sig Start Date End Date Taking? Authorizing Provider  celecoxib (CELEBREX) 200 MG capsule Take 1 capsule (200 mg total) by mouth every 12 (twelve) hours. 09/16/12  Yes Genelle Gather Babish, PA-C  Cholecalciferol (VITAMIN D PO) Take 1,000 mg by mouth daily.    Yes Historical Provider, MD  docusate sodium 100 MG CAPS Take 100 mg by mouth 2 (two) times daily. 09/16/12  Yes Genelle Gather Babish, PA-C  ferrous sulfate 325 (65 FE) MG tablet Take 1 tablet (325 mg total) by mouth 3 (three) times daily after meals. 09/16/12  Yes Genelle Gather Babish, PA-C  losartan-hydrochlorothiazide (HYZAAR) 100-12.5 MG per tablet Take 1 tablet by mouth daily.   Yes Historical Provider, MD  omeprazole (PRILOSEC) 20 MG capsule Take 20 mg by mouth daily.   Yes Historical Provider, MD  polyethylene glycol (MIRALAX / GLYCOLAX) packet Take 17 g by mouth 2 (two) times daily. 09/16/12  Yes Genelle Gather Babish, PA-C  rivaroxaban (XARELTO) 10 MG TABS tablet Take 1 tablet (10 mg total) by mouth daily. 09/16/12  Yes Genelle Gather Babish, PA-C  tiZANidine (ZANAFLEX) 4 MG capsule Take 1 capsule (4 mg total) by mouth 3 (three) times daily. Muscle spasms 09/16/12  Yes Genelle Gather [place], PA-C   Physical Exam: Filed Vitals:   09/18/12 1200 09/18/12 1230 09/18/12 1300 09/18/12 1357  BP: 114/60 127/62 142/65 129/59  Pulse: 57 72 73 72  Temp:    97.5 F (36.4 C)  TempSrc:    Oral  Resp: 15 13 23 20   SpO2: 96% 96% 99% 99%     General exam: Moderately built and nourished male patient, lying comfortably supine on the bed in no obvious distress.  Head, eyes and ENT: Nontraumatic and normocephalic. Pupils  equally reacting to light and accommodation. Oral mucosa slightly dry.  Neck: Supple. No JVD, carotid bruit or thyromegaly.  Lymphatics: No lymphadenopathy.  Respiratory system: Clear to auscultation. No increased work of breathing.  Cardiovascular system: S1 and S2 heard, RRR. No JVD, murmurs, gallops, clicks. Trace bilateral leg edema.  Gastrointestinal system: Abdomen is nondistended, soft and nontender. Normal bowel sounds heard. No organomegaly or masses appreciated.  Central nervous system: Alert and oriented. No focal neurological deficits.  Extremities: Symmetric 5 x 5 power. Peripheral pulses symmetrically felt. Bilateral knee surgical dressing site, clean, dry and intact. No acute findings.  Skin: No rashes or acute findings.  Musculoskeletal system: Negative exam.  Psychiatry: Pleasant and cooperative.   Labs on Admission:  Basic Metabolic Panel:  Recent Labs Lab 09/14/12 0425 09/15/12 0435 09/18/12 1000 09/18/12 1011  NA 136 137 135 136  K 4.6 4.4 4.2 4.2  CL 101 108 101 103  CO2 29 26 26   --   GLUCOSE 112* 118* 108* 109*  BUN 26* 25* 31* 29*  CREATININE 1.22 1.22 1.47* 1.50*  CALCIUM 7.9* 7.7* 8.4  --    Liver Function Tests:  Recent Labs Lab 09/18/12 1000  AST 34  ALT 26  ALKPHOS 74  BILITOT 0.8  PROT 5.4*  ALBUMIN 2.7*   No results found for this basename: LIPASE, AMYLASE,  in the last 168 hours No results found for this basename: AMMONIA,  in the last 168 hours CBC:  Recent Labs Lab 09/14/12 0425 09/15/12 0435 09/18/12 1000 09/18/12 1011  WBC 11.5* 10.9* 7.4  --   NEUTROABS  --   --  5.5  --   HGB 11.7* 9.2* 8.8* 8.2*  HCT 35.1* 28.3* 26.0* 24.0*  MCV 92.9 91.3 91.5  --   PLT 156 133* 179  --    Cardiac Enzymes: No results found for this basename: CKTOTAL, CKMB, CKMBINDEX, TROPONINI,  in the last 168 hours  BNP (last 3 results) No results found for this basename: PROBNP,  in the last 8760 hours CBG:  Recent Labs Lab  09/18/12 0920  GLUCAP 103*    Radiological Exams on Admission: Ct Head Wo Contrast  09/18/2012  *RADIOLOGY REPORT*  Clinical Data: Loss of consciousness  CT HEAD WITHOUT CONTRAST  Technique:  Contiguous axial images were obtained from the base of the skull through the vertex without contrast.  Comparison: None.  Findings: There is no evidence for acute hemorrhage, hydrocephalus, mass lesion, or abnormal extra-axial fluid collection.  No definite CT evidence of acute infarct. Asymmetry in the temporal tips felt to be related the patient positioning.  Bone windows show the visualized portions of the paranasal sinuses to be clear.  IMPRESSION: No acute cardiopulmonary findings.   Original Report Authenticated By: Kennith Center, M.D.    Ct Angio Chest Pe W/cm &/or Wo Cm  09/18/2012  *RADIOLOGY REPORT*  Clinical Data: Syncope, recent surgery, dyspnea  CT ANGIOGRAPHY CHEST  Technique:  Multidetector CT imaging of the chest using the standard protocol during bolus administration of intravenous contrast. Multiplanar reconstructed images including MIPs were obtained and reviewed to evaluate the vascular anatomy.  Contrast: OMNIPAQUE IOHEXOL 350 MG/ML SOLN  Comparison: None.  Findings: Small hiatal hernia is noted.  Sagittal images of the spine shows degenerative changes thoracic spine. A distended gallbladder is noted without evidence of calcified gallstones.  Adrenal glands are unremarkable.  No central pulmonary embolus is noted.  There is suboptimal contrast opacification of the sub segmental arterial branches.  No mediastinal hematoma or adenopathy. Atherosclerotic calcifications of the coronary arteries.  The heart size is within normal limits. Trace pericardial effusion or thickening anteriorly.  Images of the lung parenchyma shows no acute infiltrate or pulmonary edema. Mild dependent atelectasis noted posteriorly.  IMPRESSION:  1.  No central pulmonary embolus is noted. 2.  Small hiatal hernia. 3.  No  acute infiltrate or pulmonary edema. 4.  Distended gallbladder without evidence of calcified gallstones. 5.  Degenerative changes thoracic spine.   Original Report Authenticated By: Natasha Mead, M.D.    Dg Chest Port 1 View  09/18/2012  *RADIOLOGY REPORT*  Clinical Data: Syncope.  Tachycardia.  PORTABLE CHEST - 1 VIEW  Comparison: 09/08/2012  Findings: 0941 hours. Cardiopericardial silhouette is at upper limits of normal for size. No evidence for pulmonary edema or focal airspace consolidation.  No pleural effusion. Imaged  bony structures of the thorax are intact.  Degenerative changes are noted in both shoulders. Telemetry leads overlie the chest.  IMPRESSION: Borderline cardiomegaly with vascular congestion.   Original Report Authenticated By: Kennith Center, M.D.     EKG: Independently reviewed. Normal sinus rhythm, normal axis, T. inversion in leads 3, aVF and ST depression in leads V3-4. No acute findings. QTC 417 ms.  Assessment/Plan Principal Problem:   Syncope and collapse Active Problems:   HTN (hypertension)   Benign hypertensive heart disease without heart failure   PSVT (paroxysmal supraventricular tachycardia)   S/P bilateral TKA   Expected blood loss anemia   Hypotension   Confusion   CKD (chronic kidney disease), stage III   Dehydration   1. Syncope and collapse: Secondary to hypotension versus? Vasovagal. Monitor on telemetry. EKG without acute changes. Troponin x1 negative. CTA chest-no PE. No further workup planned at this time-defer to rounding PCP in am if he deems further work up needed. 2. Hypotension: Possibly secondary to dehydration, anemia, antihypertensives/diuretics, opioids and? Vasovagal. Improved after IV fluids. Continue brief IV fluid hydration. Hold antihypertensives for today and monitor. 3. Acute blood loss anemia: From recent bilateral TKA. Hemoglobin stable compared to 4/30. Hemoglobin may however drop post hydration. Follow CBC in a.m. and consider  transfusion if less than 8 g/dL. 4. Dehydration: Brief IV fluids. 5. Confusion: Likely secondary to opioids. Resolved. CT head negative. Avoid opioids. Per patient's preference Tramadol 50 mg every 6 hours scheduled to try to stay ahead of the pain and when necessary Tylenol. Monitor.  6. Stage III chronic kidney disease: May have a mild acute complement from dehydration, ARB and diuretics. Hold antihypertensives for a day or 2. Brief IV fluids and follow BMP in a.m. 7. Status post bilateral TKA on 4/28: Ortho MD aware of admission. Continue PT, OT, pain management and Xarelto for DVT prophylaxis. 8. Hypertension: Management as above 9. PSVT: Currently in sinus rhythm.  Code Status: Full  Family Communication: Discussed with patient's son and spouse at bedside.  Disposition Plan: Inpatient admission. Discharge back to SNF when stable.   Will leave message for patient's PCP to take over primary care on 09/19/12.  Time spent: 60 minutes  [facility] Pager [phone]  If 7PM-7AM, please contact night-coverage [email or web address] Password [facility] 09/18/2012, 3:19 PM

## 2012-09-18 NOTE — ED Notes (Signed)
Pt from [facility].  Pt got up to take shower this am.  When he was walking back from the shower he states he had a sudden onset of weakness and sob.  Got back to bed and states that he had an episode of LOC.  Called nursing home staff, pt states that his BP was low and "they didn't do anything."  Pt called 911, EMS initial BP was in 80s systolic and pt was pale. EMS started 18ga IV and gave NS.  Pt alert oriented, radial pulse moderate.

## 2012-09-18 NOTE — ED Notes (Signed)
WUJ:[id number]<JY> Expected date:09/18/12<BR> Expected time: 8:37 AM<BR> Means of arrival:Ambulance<BR> Comments:<BR> Knee pain

## 2012-09-18 NOTE — Telephone Encounter (Signed)
Mr. 's son, Heath Lark, called the answering service requesting a call back regarding his dad. Littleton Haub is Dr. Yevonne Pax patient with PSVT and HTN. He was recently seen by Dr. Patty Sermons and given clearance for surgery. Salome Holmes underwent total knee replacement on Monday 09/13/2012 with Dr. Charlann Boxer. He was discharged to [facility]. Yesterday Hadriel  developed weakness, SOB and hypotension and was transported to [facility] ED. Rodell 's son, Ambrose Pancoast, has spoken with Dr. Charlann Boxer who feels Rieley's symptoms are probably due to acute blood loss anemia from surgery. Heath Lark wanted to make [place] aware that he is being admitted. I reassured Heath Lark that we are available and would be happy to see his father in consultation at Dr. Nilsa Nutting request. Heath Lark expressed verbal understanding and was instructed to call us back should he have any further questions or concerns.

## 2012-09-19 DIAGNOSIS — I10 Essential (primary) hypertension: Secondary | ICD-10-CM | POA: Diagnosis not present

## 2012-09-19 DIAGNOSIS — R110 Nausea: Secondary | ICD-10-CM | POA: Diagnosis not present

## 2012-09-19 DIAGNOSIS — R5381 Other malaise: Secondary | ICD-10-CM | POA: Diagnosis not present

## 2012-09-19 DIAGNOSIS — D649 Anemia, unspecified: Secondary | ICD-10-CM | POA: Diagnosis not present

## 2012-09-19 DIAGNOSIS — R55 Syncope and collapse: Secondary | ICD-10-CM | POA: Diagnosis not present

## 2012-09-19 LAB — BASIC METABOLIC PANEL
GFR calc Af Amer: 70 mL/min — ABNORMAL LOW (ref >90)
GFR calc non Af Amer: 60 mL/min — ABNORMAL LOW (ref >90)
Glucose, Bld: 100 mg/dL — ABNORMAL HIGH (ref 70–99)
Potassium: 3.9 mEq/L (ref 3.5–5.1)
Sodium: 135 mEq/L (ref 135–145)

## 2012-09-19 LAB — OCCULT BLOOD X 1 CARD TO LAB, STOOL: Fecal Occult Bld: NEGATIVE

## 2012-09-19 LAB — CBC
Hemoglobin: 8 g/dL — ABNORMAL LOW (ref 13.0–17.0)
Platelets: 183 10*3/uL (ref 150–400)
RBC: 2.65 MIL/uL — ABNORMAL LOW (ref 4.22–5.81)
WBC: 6 10*3/uL (ref 4.0–10.5)

## 2012-09-19 MED ORDER — ACETAMINOPHEN 500 MG PO TABS
1000.0000 mg | ORAL_TABLET | Freq: Three times a day (TID) | ORAL | Status: DC
  Administered 2012-09-19 – 2012-09-21 (×6): 1000 mg via ORAL
  Filled 2012-09-19 (×9): qty 2

## 2012-09-19 MED ORDER — TRAMADOL HCL 50 MG PO TABS: 50.0000 mg | ORAL_TABLET | Freq: Three times a day (TID) | ORAL | Status: DC | PRN

## 2012-09-19 MED ORDER — SODIUM CHLORIDE 0.9 % IJ SOLN
3.0000 mL | INTRAMUSCULAR | Status: DC | PRN
  Administered 2012-09-19 – 2012-09-20 (×2): 3 mL via INTRAVENOUS

## 2012-09-19 MED ORDER — SODIUM CHLORIDE 0.9 % IV SOLN
INTRAVENOUS | Status: DC
  Administered 2012-09-19: 04:00:00 via INTRAVENOUS

## 2012-09-19 NOTE — Evaluation (Signed)
Physical Therapy Evaluation Patient Details Name: [name] [name] MRN: [id number] DOB: 01-May-1936 Today's Date: 09/19/2012 Time: 4098-1191 PT Time Calculation (min): 27 min  PT Assessment / Plan / Recommendation Clinical Impression  77 yo male admitted with syncope, collapse. S/p bil TKA on 4/28. On eval, pt required Min assist for mobility-able to ambulate ~100 feet with RW. Mild lightheadedness with activity but pt was able to participate well. Recommend return to [facility] to complete rehab.     PT Assessment  Patient needs continued PT services    Follow Up Recommendations  SNF    Does the patient have the potential to tolerate intense rehabilitation      Barriers to Discharge        Equipment Recommendations  None recommended by PT    Recommendations for Other Services OT consult   Frequency Min 5X/week    Precautions / Restrictions Precautions Precautions: Knee Restrictions Weight Bearing Restrictions: No RLE Weight Bearing: Weight bearing as tolerated LLE Weight Bearing: Weight bearing as tolerated   Pertinent Vitals/Pain 5/10 bil knees with activity. Ice on knees at end of session      Mobility  Bed Mobility Bed Mobility: Supine to Sit Supine to Sit: 5: Supervision;HOB flat Sit to Supine: 5: Supervision;HOB flat Transfers Transfers: Sit to Stand;Stand to Sit Sit to Stand: 4: Min assist;From bed;From elevated surface Stand to Sit: 4: Min guard;To chair/3-in-1;With armrests Details for Transfer Assistance: VCS safety, technique, hand placement. Assist to rise.  Ambulation/Gait Ambulation/Gait Assistance: 4: Min guard Ambulation Distance (Feet): 100 Feet Assistive device: Rolling walker Gait Pattern: Step-through pattern;Decreased stride length    Exercises Total Joint Exercises Ankle Circles/Pumps: AROM;Both;15 reps;Seated Quad Sets: AROM;Both;10 reps;Seated Straight Leg Raises: AROM;AAROM;Both;10 reps;Seated Long Arc Quad: AROM;Both;10  reps;Seated Knee Flexion: AROM;Both;5 reps;Seated Goniometric ROM: 0-100 degrees L, 0-95 degrees R   PT Diagnosis: Difficulty walking;Abnormality of gait;Acute pain  PT Problem List: Decreased strength;Decreased range of motion;Decreased activity tolerance;Decreased mobility;Pain PT Treatment Interventions: DME instruction;Gait training;Functional mobility training;Therapeutic activities;Therapeutic exercise;Patient/family education   PT Goals Acute Rehab PT Goals Pt will go Supine/Side to Sit: with modified independence PT Goal: Supine/Side to Sit - Progress: Goal set today Pt will go Sit to Supine/Side: with modified independence PT Goal: Sit to Supine/Side - Progress: Goal set today Pt will go Sit to Stand: with modified independence PT Goal: Sit to Stand - Progress: Goal set today Pt will go Stand to Sit: with modified independence PT Goal: Stand to Sit - Progress: Goal set today Pt will Ambulate: >150 feet;with modified independence;with least restrictive assistive device PT Goal: Ambulate - Progress: Goal set today Pt will Perform Home Exercise Program: with supervision, verbal cues required/provided PT Goal: Perform Home Exercise Program - Progress: Goal set today  Visit Information  Last PT Received On: 09/19/12 Assistance Needed: +1    Subjective Data  Subjective: [name] been having trouble with my pain meds Patient Stated Goal: return to [facility] to finish rehab   Prior Functioning  Home Living Additional Comments: from [facility]-plans to return to complete rehab Prior Function Level of Independence: Needs assistance    Cognition  Cognition Arousal/Alertness: Awake/alert Behavior During Therapy: WFL for tasks assessed/performed Overall Cognitive Status: Within Functional Limits for tasks assessed    Extremity/Trunk Assessment Right Lower Extremity Assessment RLE ROM/Strength/Tone: Deficits RLE ROM/Strength/Tone Deficits: hip flex 3/5, knee ext 3/5, moves ankle  well. Knee ROM: 0-95 degrees Left Lower Extremity Assessment LLE ROM/Strength/Tone: Deficits LLE ROM/Strength/Tone Deficits: hip flex 3/5, knee ext 3/5,  moves ankle well. Knee ROM: 0-100 degrees Trunk Assessment Trunk Assessment: Normal   Balance    End of Session PT - End of Session Equipment Utilized During Treatment: Gait belt Activity Tolerance: Patient tolerated treatment well Patient left: in chair;with call bell/phone within reach;with family/visitor present  GP     Rebeca Alert, MPT Pager: [phone]

## 2012-09-19 NOTE — Progress Notes (Signed)
Subjective:  Still feels weak and fatigued a little nauseated  Objective: Vital signs in last 24 hours: Temp:  [97.1 F (36.2 C)-97.8 F (36.6 C)] 97.8 F (36.6 C) (05/04 0515) Pulse Rate:  [56-73] 71 (05/04 0515) Resp:  [13-23] 18 (05/04 0515) BP: (107-155)/(58-66) 151/66 mmHg (05/04 0516) SpO2:  [96 %-100 %] 98 % (05/04 0515) Weight:  [98.5 kg (217 lb 2.5 oz)] 98.5 kg (217 lb 2.5 oz) (05/04 0515) Weight change:  Last BM Date: 09/18/12  Intake/Output from previous day: 05/03 0701 - 05/04 0700 In: 1710 [P.O.:800; I.V.:535] Out: 625 [Urine:625] Intake/Output this shift: Total I/O In: 120 [P.O.:120] Out: -   Resp: clear to auscultation bilaterally Cardio: regular rate and rhythm, S1, S2 normal, no murmur, click, rub or gallop  Lab Results:  Recent Labs  09/18/12 1000 09/18/12 1011 09/19/12 0435  WBC 7.4  --  6.0  HGB 8.8* 8.2* 8.0*  HCT 26.0* 24.0* 24.1*  PLT 179  --  183   BMET  Recent Labs  09/18/12 1000 09/18/12 1011 09/19/12 0435  NA 135 136 135  K 4.2 4.2 3.9  CL 101 103 103  CO2 26  --  24  GLUCOSE 108* 109* 100*  BUN 31* 29* 22  CREATININE 1.47* 1.50* 1.16  CALCIUM 8.4  --  7.9*    Studies/Results: Ct Head Wo Contrast  09/18/2012  *RADIOLOGY REPORT*  Clinical Data: Loss of consciousness  CT HEAD WITHOUT CONTRAST  Technique:  Contiguous axial images were obtained from the base of the skull through the vertex without contrast.  Comparison: None.  Findings: There is no evidence for acute hemorrhage, hydrocephalus, mass lesion, or abnormal extra-axial fluid collection.  No definite CT evidence of acute infarct. Asymmetry in the temporal tips felt to be related the patient positioning.  Bone windows show the visualized portions of the paranasal sinuses to be clear.  IMPRESSION: No acute cardiopulmonary findings.   Original Report Authenticated By: Kennith Center, M.D.    Ct Angio Chest Pe W/cm &/or Wo Cm  09/18/2012  *RADIOLOGY REPORT*  Clinical Data:  Syncope, recent surgery, dyspnea  CT ANGIOGRAPHY CHEST  Technique:  Multidetector CT imaging of the chest using the standard protocol during bolus administration of intravenous contrast. Multiplanar reconstructed images including MIPs were obtained and reviewed to evaluate the vascular anatomy.  Contrast: OMNIPAQUE IOHEXOL 350 MG/ML SOLN  Comparison: None.  Findings: Small hiatal hernia is noted.  Sagittal images of the spine shows degenerative changes thoracic spine. A distended gallbladder is noted without evidence of calcified gallstones.  Adrenal glands are unremarkable.  No central pulmonary embolus is noted.  There is suboptimal contrast opacification of the sub segmental arterial branches.  No mediastinal hematoma or adenopathy. Atherosclerotic calcifications of the coronary arteries.  The heart size is within normal limits. Trace pericardial effusion or thickening anteriorly.  Images of the lung parenchyma shows no acute infiltrate or pulmonary edema. Mild dependent atelectasis noted posteriorly.  IMPRESSION:  1.  No central pulmonary embolus is noted. 2.  Small hiatal hernia. 3.  No acute infiltrate or pulmonary edema. 4.  Distended gallbladder without evidence of calcified gallstones. 5.  Degenerative changes thoracic spine.   Original Report Authenticated By: Natasha Mead, M.D.    Dg Chest Port 1 View  09/18/2012  *RADIOLOGY REPORT*  Clinical Data: Syncope.  Tachycardia.  PORTABLE CHEST - 1 VIEW  Comparison: 09/08/2012  Findings: 0941 hours. Cardiopericardial silhouette is at upper limits of normal for size. No evidence for pulmonary  edema or focal airspace consolidation.  No pleural effusion. Imaged bony structures of the thorax are intact.  Degenerative changes are noted in both shoulders. Telemetry leads overlie the chest.  IMPRESSION: Borderline cardiomegaly with vascular congestion.   Original Report Authenticated By: Kennith Center, M.D.     Medications:  Scheduled: . celecoxib  200 mg Oral  Q12H  . cholecalciferol  1,000 Units Oral Daily  . docusate sodium  100 mg Oral BID  . ferrous sulfate  325 mg Oral TID PC  . pantoprazole  40 mg Oral Daily  . polyethylene glycol  17 g Oral BID  . rivaroxaban  10 mg Oral Q breakfast  . sodium chloride  3 mL Intravenous Q12H  . tiZANidine  4 mg Oral TID  . traMADol  50 mg Oral Q6H    Assessment/Plan: 1. Syncope likely multifactorial leading to orthostasis,including, pain nausea and anemia. Telemetry reveal pac and pvc 2. Anemia post op now hgbf 8.0 will transfuse 1 unit  3. S/p bilateral TKR will restart therapy when feeling better 4. htn fair control  LOS: 1 day   [facility] 09/19/2012, 8:22 AM

## 2012-09-19 NOTE — Progress Notes (Addendum)
Clinical Social Work Department BRIEF PSYCHOSOCIAL ASSESSMENT 09/19/2012  Patient:  [name] [name], [name] [name]     Account Number:  [id number]     Admit date:  09/18/2012  Clinical Social Worker:  Doroteo Glassman  Date/Time:  09/19/2012 10:59 AM  Referred by:  Physician  Date Referred:  09/19/2012 Referred for  SNF Placement   Other Referral:   Interview type:  Patient Other interview type:    PSYCHOSOCIAL DATA Living Status:  WIFE Admitted from facility:  [facility] Level of care:  Skilled Nursing Facility Primary support name:  Evlyn Kanner Primary support relationship to patient:  SPOUSE Degree of support available:   strong    CURRENT CONCERNS Current Concerns  Post-Acute Placement   Other Concerns:    SOCIAL WORK ASSESSMENT / PLAN Met with Pt, wife and son at bedside.    Pt was unhappy with the way his treatment was handled at [facility] and stated that he has been unhappy with his tx at [facility].  Pt stated that it is his preference to go home at d/c, as opposed to going back to [place], with [facility] PT.  Pt stated that he is willing to pay out-of-pocket for extra PT.  He hasn't made a final decision but is unhappy, overall, with his care at both facilities.    CSW thanked Pt and his family for their time.    CSW notified RNCM of Pt's desire to go home with [facility] PT.  Per Siri Cole at [facility], ok for Pt to return.   Assessment/plan status:  Psychosocial Support/Ongoing Assessment of Needs Other assessment/ plan:   Information/referral to community resources:   n/a    PATIENT'S/FAMILY'S RESPONSE TO PLAN OF CARE: Pt and family thanked CSW for time and assistance.   Providence Crosby, LCSWA Clinical Social Work [phone]

## 2012-09-19 NOTE — Care Management Note (Addendum)
    Page 1 of 1   09/21/2012     12:41:46 PM   CARE MANAGEMENT NOTE 09/21/2012  Patient:  [name] [name], [name] [name]   Account Number:  [id number]  Date Initiated:  09/19/2012  Documentation initiated by:  Lanier Clam  Subjective/Objective Assessment:   ADMITTED W/SYNCOPE.READMIT 4/28-09/16/12-BILAT TKA.     Action/Plan:   FROM SNF-[facility]   Anticipated DC Date:  09/21/2012   Anticipated DC Plan:  SKILLED NURSING FACILITY      DC Planning Services  CM consult      Choice offered to / List presented to:             Status of service:  Completed, signed off Medicare Important Message given?   (If response is "NO", the following Medicare IM given date fields will be blank) Date Medicare IM given:   Date Additional Medicare IM given:    Discharge Disposition:  SKILLED NURSING FACILITY  Per UR Regulation:  Reviewed for med. necessity/level of care/duration of stay  If discussed at Long Length of Stay Meetings, dates discussed:    Comments:  09/21/12 [name] Polinski RN,BSN NCM [phone] D/C SNF.  09/19/12 [name] Trefz RN,BSN NCM WEEKEND [phone] RECEIVED CALL TO TALK TO PATIENT ABOUT D/C PLANS,& HHC SERVICES.EXPLAINED TO PATIENT THAT PER NURSING MD HIGHLY RECOMMENDED RETURNING TO SNF VS HOME W/HH.EXPLAINED HHC SERVICES.PT-SNF.PATIENT AGREEABLE TO SNF.

## 2012-09-19 NOTE — Plan of Care (Signed)
Problem: Phase II Progression Outcomes Goal: Vital signs remain stable Outcome: Progressing Heart rate jumps up between 130-140 during activity, patient is asymptomatic during these times.

## 2012-09-20 DIAGNOSIS — R000 Tachycardia, unspecified: Secondary | ICD-10-CM | POA: Diagnosis not present

## 2012-09-20 DIAGNOSIS — I10 Essential (primary) hypertension: Secondary | ICD-10-CM | POA: Diagnosis not present

## 2012-09-20 DIAGNOSIS — R55 Syncope and collapse: Secondary | ICD-10-CM | POA: Diagnosis not present

## 2012-09-20 DIAGNOSIS — D649 Anemia, unspecified: Secondary | ICD-10-CM | POA: Diagnosis not present

## 2012-09-20 DIAGNOSIS — I959 Hypotension, unspecified: Secondary | ICD-10-CM | POA: Diagnosis not present

## 2012-09-20 LAB — BASIC METABOLIC PANEL
CO2: 25 mEq/L (ref 19–32)
Glucose, Bld: 104 mg/dL — ABNORMAL HIGH (ref 70–99)
Potassium: 3.6 mEq/L (ref 3.5–5.1)
Sodium: 139 mEq/L (ref 135–145)

## 2012-09-20 LAB — CBC
Hemoglobin: 8.5 g/dL — ABNORMAL LOW (ref 13.0–17.0)
Platelets: 220 10*3/uL (ref 150–400)
RBC: 2.86 MIL/uL — ABNORMAL LOW (ref 4.22–5.81)
WBC: 5 10*3/uL (ref 4.0–10.5)

## 2012-09-20 LAB — OCCULT BLOOD X 1 CARD TO LAB, STOOL: Fecal Occult Bld: NEGATIVE

## 2012-09-20 MED ORDER — METOPROLOL TARTRATE 25 MG PO TABS
25.0000 mg | ORAL_TABLET | Freq: Two times a day (BID) | ORAL | Status: DC
  Administered 2012-09-20 – 2012-09-21 (×3): 25 mg via ORAL
  Filled 2012-09-20 (×4): qty 1

## 2012-09-20 NOTE — Progress Notes (Signed)
Patient ID: [name] [name], male   DOB: 1935/09/25, 77 y.o.   MRN: [id number]   1 week status post Bilateral TKRs readmitted due to orthostatic hypotension and confusion/anxiety due to meds  Doing better today Have worked a lot to simplify and clarify meds, currently using tylenol for pain, tramadol for breakthough  Hgb now 8.5 improving HR issues addressed through medical team and Cardiology Restarted metoprolol  Probably will be cleared to return to [facility] tomorrow for about a week then home with HHPT

## 2012-09-20 NOTE — Progress Notes (Signed)
Physical Therapy Treatment Patient Details Name: [name] [name] MRN: [id number] DOB: Jul 14, 1935 Today's Date: 09/20/2012 Time: 1308-6578 PT Time Calculation (min): 18 min  PT Assessment / Plan / Recommendation Comments on Treatment Session  Pt doing very well s/p B TKA.  HR up to 120's with getting shorts on and standing then again after approx 80' of amb.  Would quickly decrease to 90's.      Follow Up Recommendations  SNF     Does the patient have the potential to tolerate intense rehabilitation     Barriers to Discharge        Equipment Recommendations  None recommended by PT    Recommendations for Other Services    Frequency Min 5X/week   Plan Discharge plan remains appropriate    Precautions / Restrictions Precautions Precautions: Knee Restrictions Weight Bearing Restrictions: No RLE Weight Bearing: Weight bearing as tolerated LLE Weight Bearing: Weight bearing as tolerated   Pertinent Vitals/Pain HR prior to activity was in 70's, following dressing and standing was up to 123 (fluctuated) and then up to 121 with 80' of ambulation.  Able to quickly decrease HR to 90's with rest.     Mobility  Bed Mobility Bed Mobility: Supine to Sit Supine to Sit: 6: Modified independent (Device/Increase time) Details for Bed Mobility Assistance: increased time Transfers Transfers: Sit to Stand;Stand to Sit Sit to Stand: 4: Min guard;From elevated surface;With upper extremity assist;From bed Stand to Sit: 4: Min guard;With upper extremity assist;To bed;To elevated surface Details for Transfer Assistance: Mod to max cues for hand placement and safety.  Pt attempted to pull up on RW when standing and then push RW to the side when sitting.  Ambulation/Gait Ambulation/Gait Assistance: 4: Min guard Ambulation Distance (Feet): 150 Feet (standing rest break in between. ) Assistive device: Rolling walker Ambulation/Gait Assistance Details: Pt doing very well with ambulation.  Min cues for  upright posture and to take rest break due to HR up to 121 before returning to room.  Gait Pattern: Step-through pattern;Decreased stride length Gait velocity: decreased    Exercises     PT Diagnosis:    PT Problem List:   PT Treatment Interventions:     PT Goals Acute Rehab PT Goals PT Goal Formulation: With patient Time For Goal Achievement: 09/21/12 Potential to Achieve Goals: Good Pt will go Supine/Side to Sit: with modified independence PT Goal: Supine/Side to Sit - Progress: Met Pt will go Sit to Stand: with modified independence PT Goal: Sit to Stand - Progress: Progressing toward goal Pt will go Stand to Sit: with modified independence PT Goal: Stand to Sit - Progress: Progressing toward goal Pt will Ambulate: >150 feet;with modified independence;with least restrictive assistive device PT Goal: Ambulate - Progress: Progressing toward goal  Visit Information  Last PT Received On: 09/20/12 Assistance Needed: +1    Subjective Data  Subjective: Hopefully I can get back to rehab soon.  Patient Stated Goal: return to [facility] to finish rehab   Cognition  Cognition Arousal/Alertness: Awake/alert Behavior During Therapy: WFL for tasks assessed/performed Overall Cognitive Status: Within Functional Limits for tasks assessed    Balance     End of Session PT - End of Session Equipment Utilized During Treatment: Gait belt Activity Tolerance: Patient tolerated treatment well Patient left: in bed;with call bell/phone within reach;with family/visitor present (on EOB shaving)   GP     Connelly [place], Meribeth Mattes 09/20/2012, 11:11 AM

## 2012-09-20 NOTE — Progress Notes (Signed)
Subjective: [name] was seen this morning and still feeling lightheaded when standing up and also having a rapid heart rate. He has a history of paroxysmal atrial tachycardia. Heart rate by nursing report have been going up as high as 140-160 when he stands. Has not been on his beta blocker which he normally takes chronically prior to going to the nursing facility for physical therapy. Appears mildly pale, hemoglobin 8.5. Denies melena  Objective: Weight change:   Intake/Output Summary (Last 24 hours) at 09/20/12 1921 Last data filed at 09/20/12 0900  Gross per 24 hour  Intake    603 ml  Output    551 ml  Net     52 ml   Filed Vitals:   09/20/12 0524 09/20/12 0525 09/20/12 0528 09/20/12 1341  BP: 158/87 161/89 174/99 129/68  Pulse: 84 113 119 65  Temp:    98.8 F (37.1 C)  TempSrc:    Oral  Resp:    16  Height:      Weight:      SpO2:    99%    General Appearance: Alert, cooperative, no distress, appears stated age Lungs: Clear to auscultation bilaterally, respirations unlabored Heart: Regular rate and rhythm, S1 and S2 normal, no murmur, rub or gallop Abdomen: Soft, non-tender, bowel sounds active all four quadrants, no masses, no organomegaly Extremities: Extremities normal, atraumatic, no cyanosis or edema Neuro: alert,nonfocal exam, moves all extremities well  Lab Results: Results for orders placed during the hospital encounter of 09/18/12 (from the past 48 hour(s))  BASIC METABOLIC PANEL     Status: Abnormal   Collection Time    09/19/12  4:35 AM      Result Value Range   Sodium 135  135 - 145 mEq/L   Potassium 3.9  3.5 - 5.1 mEq/L   Chloride 103  96 - 112 mEq/L   CO2 24  19 - 32 mEq/L   Glucose, Bld 100 (*) 70 - 99 mg/dL   BUN 22  6 - 23 mg/dL   Creatinine, Ser 9.56  0.50 - 1.35 mg/dL   Calcium 7.9 (*) 8.4 - 10.5 mg/dL   GFR calc non Af Amer 60 (*) >90 mL/min   GFR calc Af Amer 70 (*) >90 mL/min   Comment:            The eGFR has been calculated     using the  CKD EPI equation.     This calculation has not been     validated in all clinical     situations.     eGFR's persistently     <90 mL/min signify     possible Chronic Kidney Disease.  CBC     Status: Abnormal   Collection Time    09/19/12  4:35 AM      Result Value Range   WBC 6.0  4.0 - 10.5 K/uL   RBC 2.65 (*) 4.22 - 5.81 MIL/uL   Hemoglobin 8.0 (*) 13.0 - 17.0 g/dL   HCT 21.3 (*) 08.6 - 57.8 %   MCV 90.9  78.0 - 100.0 fL   MCH 30.2  26.0 - 34.0 pg   MCHC 33.2  30.0 - 36.0 g/dL   RDW 46.9  62.9 - 52.8 %   Platelets 183  150 - 400 K/uL  OCCULT BLOOD X 1 CARD TO LAB, STOOL     Status: None   Collection Time    09/19/12  5:45 AM  Result Value Range   Fecal Occult Bld NEGATIVE  NEGATIVE  OCCULT BLOOD X 1 CARD TO LAB, STOOL     Status: None   Collection Time    09/20/12  4:00 AM      Result Value Range   Fecal Occult Bld NEGATIVE  NEGATIVE  CBC     Status: Abnormal   Collection Time    09/20/12  4:36 AM      Result Value Range   WBC 5.0  4.0 - 10.5 K/uL   RBC 2.86 (*) 4.22 - 5.81 MIL/uL   Hemoglobin 8.5 (*) 13.0 - 17.0 g/dL   HCT 78.2 (*) 95.6 - 21.3 %   MCV 91.6  78.0 - 100.0 fL   MCH 29.7  26.0 - 34.0 pg   MCHC 32.4  30.0 - 36.0 g/dL   RDW 08.6  57.8 - 46.9 %   Platelets 220  150 - 400 K/uL  BASIC METABOLIC PANEL     Status: Abnormal   Collection Time    09/20/12  4:36 AM      Result Value Range   Sodium 139  135 - 145 mEq/L   Potassium 3.6  3.5 - 5.1 mEq/L   Chloride 106  96 - 112 mEq/L   CO2 25  19 - 32 mEq/L   Glucose, Bld 104 (*) 70 - 99 mg/dL   BUN 17  6 - 23 mg/dL   Creatinine, Ser 6.29  0.50 - 1.35 mg/dL   Calcium 8.4  8.4 - 52.8 mg/dL   GFR calc non Af Amer 52 (*) >90 mL/min   GFR calc Af Amer 60 (*) >90 mL/min   Comment:            The eGFR has been calculated     using the CKD EPI equation.     This calculation has not been     validated in all clinical     situations.     eGFR's persistently     <90 mL/min signify     possible Chronic  Kidney Disease.    Studies/Results: No results found. Medications: Scheduled Meds: . acetaminophen  1,000 mg Oral Q8H  . celecoxib  200 mg Oral Q12H  . cholecalciferol  1,000 Units Oral Daily  . docusate sodium  100 mg Oral BID  . ferrous sulfate  325 mg Oral TID PC  . metoprolol tartrate  25 mg Oral BID  . pantoprazole  40 mg Oral Daily  . polyethylene glycol  17 g Oral BID  . rivaroxaban  10 mg Oral Q breakfast  . sodium chloride  3 mL Intravenous Q12H  . tiZANidine  4 mg Oral TID   Continuous Infusions: . sodium chloride 10 mL/hr at 09/19/12 0356   PRN Meds:.albuterol, ondansetron (ZOFRAN) IV, ondansetron, sodium chloride, traMADol  Assessment/Plan: Principal Problem:   Syncope and collapse - possibly multiple reasons. He currently is having tachycardia when standing and will resume beta blocker therapy. Hopefully he will feel better with a slower heart rate Active Problems:   HTN (hypertension) - currently not an issue   Benign hypertensive heart disease without heart failure - aware   PSVT (paroxysmal supraventricular tachycardia) - resuming beta blocker therapy   S/P bilateral TKA - continue physical therapy   Expected blood loss anemia - to continue iron therapy. Hemoglobin slowly coming up   Hypotension - continue to monitor   Confusion - resolved   CKD (chronic kidney disease), stage III   Dehydration -  resolving    LOS: 2 days   Pearla Dubonnet, MD 09/20/2012, 7:21 PM

## 2012-09-20 NOTE — Evaluation (Signed)
Occupational Therapy Evaluation Patient Details Name: [name] [name] MRN: [id number] DOB: 13-Sep-1935 Today's Date: 09/20/2012 Time: 1038-1100 OT Time Calculation (min): 22 min  OT Assessment / Plan / Recommendation Clinical Impression  Pt admitted from [facility] after a syncopal episode.  Pt is participating in rehab following B TKA.  Pt is performing mobility and ADL at min assist level.  He will be returning to SNF to complete rehab.  Will defer further OT to SNF.    OT Assessment  All further OT needs can be met in the next venue of care    Follow Up Recommendations  SNF    Barriers to Discharge      Equipment Recommendations  None recommended by OT    Recommendations for Other Services    Frequency       Precautions / Restrictions Precautions Precautions: Knee;Fall Restrictions Weight Bearing Restrictions: No RLE Weight Bearing: Weight bearing as tolerated LLE Weight Bearing: Weight bearing as tolerated   Pertinent Vitals/Pain BP 142/76, HR increased to 125 with bed mob and ambulation, but decreased quickly with rest break to 90s. 97% on RA.    ADL  Eating/Feeding: Independent Where Assessed - Eating/Feeding: Edge of bed Grooming: Wash/dry hands;Wash/dry face;Shaving;Set up Where Assessed - Grooming: Unsupported sitting Upper Body Bathing: Set up Where Assessed - Upper Body Bathing: Unsupported sitting Lower Body Bathing: Minimal assistance Where Assessed - Lower Body Bathing: Unsupported sitting;Supported sit to stand Upper Body Dressing: Set up Where Assessed - Upper Body Dressing: Unsupported sitting Lower Body Dressing: Minimal assistance Where Assessed - Lower Body Dressing: Unsupported sitting;Supported sit to stand Equipment Used: Gait belt;Rolling walker Transfers/Ambulation Related to ADLs: min assist with RW and chair following    OT Diagnosis: Generalized weakness  OT Problem List: Decreased strength;Decreased activity tolerance;Impaired balance  (sitting and/or standing);Decreased knowledge of use of DME or AE OT Treatment Interventions:     OT Goals    Visit Information  Last OT Received On: 09/20/12 Assistance Needed: +1 PT/OT Co-Evaluation/Treatment: Yes    Subjective Data  Subjective: "I only had one day of therapy before I came back." Patient Stated Goal: Return to [facility] to complete rehab for B TKA.   Prior Functioning     Home Living Lives With: Spouse Available Help at Discharge: Skilled Nursing Facility Type of Home: Skilled Nursing Facility Additional Comments: from [facility]-plans to return to complete rehab Prior Function Level of Independence: Needs assistance Needs Assistance: Bathing;Dressing Bath: Minimal Dressing: Minimal Communication Communication: No difficulties Dominant Hand: Right         Vision/Perception     Cognition  Cognition Arousal/Alertness: Awake/alert Behavior During Therapy: WFL for tasks assessed/performed Overall Cognitive Status: Within Functional Limits for tasks assessed    Extremity/Trunk Assessment Right Upper Extremity Assessment RUE ROM/Strength/Tone: WFL for tasks assessed RUE Coordination: WFL - gross/fine motor Left Upper Extremity Assessment LUE ROM/Strength/Tone: WFL for tasks assessed LUE Coordination: WFL - gross/fine motor Trunk Assessment Trunk Assessment: Normal     Mobility Bed Mobility Bed Mobility: Supine to Sit Supine to Sit: 6: Modified independent (Device/Increase time) Details for Bed Mobility Assistance: increased time Transfers Sit to Stand: 4: Min guard;From elevated surface;With upper extremity assist;From bed Stand to Sit: 4: Min guard;With upper extremity assist;To bed;To elevated surface Details for Transfer Assistance: Mod to max cues for hand placement and safety.  Pt attempted to pull up on RW when standing and then push RW to the side when sitting.      Exercise  Balance     End of Session OT - End of  Session Patient left: in bed;with call bell/phone within reach;with family/visitor present Nurse Communication: Mobility status  GO     Evern Bio 09/20/2012, 1:55 PM [phone]

## 2012-09-21 DIAGNOSIS — Z96659 Presence of unspecified artificial knee joint: Secondary | ICD-10-CM | POA: Diagnosis not present

## 2012-09-21 DIAGNOSIS — R55 Syncope and collapse: Secondary | ICD-10-CM | POA: Diagnosis not present

## 2012-09-21 DIAGNOSIS — I10 Essential (primary) hypertension: Secondary | ICD-10-CM | POA: Diagnosis not present

## 2012-09-21 DIAGNOSIS — R269 Unspecified abnormalities of gait and mobility: Secondary | ICD-10-CM | POA: Diagnosis not present

## 2012-09-21 DIAGNOSIS — M6281 Muscle weakness (generalized): Secondary | ICD-10-CM | POA: Diagnosis not present

## 2012-09-21 DIAGNOSIS — M1990 Unspecified osteoarthritis, unspecified site: Secondary | ICD-10-CM | POA: Diagnosis not present

## 2012-09-21 DIAGNOSIS — Z471 Aftercare following joint replacement surgery: Secondary | ICD-10-CM | POA: Diagnosis not present

## 2012-09-21 DIAGNOSIS — R000 Tachycardia, unspecified: Secondary | ICD-10-CM | POA: Diagnosis not present

## 2012-09-21 DIAGNOSIS — I471 Supraventricular tachycardia: Secondary | ICD-10-CM | POA: Diagnosis not present

## 2012-09-21 DIAGNOSIS — I150 Renovascular hypertension: Secondary | ICD-10-CM | POA: Diagnosis not present

## 2012-09-21 DIAGNOSIS — D62 Acute posthemorrhagic anemia: Secondary | ICD-10-CM | POA: Diagnosis not present

## 2012-09-21 DIAGNOSIS — I959 Hypotension, unspecified: Secondary | ICD-10-CM | POA: Diagnosis not present

## 2012-09-21 DIAGNOSIS — D649 Anemia, unspecified: Secondary | ICD-10-CM | POA: Diagnosis not present

## 2012-09-21 LAB — BASIC METABOLIC PANEL
BUN: 18 mg/dL (ref 6–23)
CO2: 24 mEq/L (ref 19–32)
Glucose, Bld: 100 mg/dL — ABNORMAL HIGH (ref 70–99)
Potassium: 4.2 mEq/L (ref 3.5–5.1)
Sodium: 141 mEq/L (ref 135–145)

## 2012-09-21 LAB — CBC WITH DIFFERENTIAL/PLATELET
Eosinophils Relative: 7 % — ABNORMAL HIGH (ref 0–5)
Hemoglobin: 8.3 g/dL — ABNORMAL LOW (ref 13.0–17.0)
Lymphocytes Relative: 21 % (ref 12–46)
Lymphs Abs: 1.4 10*3/uL (ref 0.7–4.0)
MCV: 92.1 fL (ref 78.0–100.0)
Monocytes Relative: 11 % (ref 3–12)
Neutrophils Relative %: 59 % (ref 43–77)
Platelets: 235 10*3/uL (ref 150–400)
RBC: 2.79 MIL/uL — ABNORMAL LOW (ref 4.22–5.81)
WBC: 6.4 10*3/uL (ref 4.0–10.5)

## 2012-09-21 MED ORDER — METOPROLOL TARTRATE 25 MG PO TABS: 25.0000 mg | ORAL_TABLET | Freq: Two times a day (BID) | ORAL | Status: DC

## 2012-09-21 NOTE — Progress Notes (Signed)
   Subjective:  Patient reports pain as mild, states that it is being well controlled with Tylenol. No events throughout the night. Feels that he is now on the right track and ready to get back to therapy.  Objective:   VITALS:   Filed Vitals:   09/21/12 0549  BP: 129/72  Pulse: 61  Temp: 97.7 F (36.5 C)  Resp: 18    Neurovascular intact Dorsiflexion/Plantar flexion intact Incision: dressing C/D/I No cellulitis present Compartment soft  LABS  Recent Labs  09/19/12 0435 09/20/12 0436 09/21/12 0514  HGB 8.0* 8.5* 8.3*  HCT 24.1* 26.2* 25.7*  WBC 6.0 5.0 6.4  PLT 183 220 235     Recent Labs  09/19/12 0435 09/20/12 0436 09/21/12 0514  NA 135 139 141  K 3.9 3.6 4.2  BUN 22 17 18   CREATININE 1.16 1.31 1.23  GLUCOSE 100* 104* 100*     Assessment/Plan:   Up with therapy Discharge to SNF when ready medically. Orthopaedically stable. Will use APAP for pain management. Tramadol if really needed, however he states that he will stay away from it unless really needed. Follow up in 1 weeks at [facility]. Continue Xarelto for anticoagulation Follow up with OLIN,Zykiria Bruening D in 1 weeks.  Contact information:  [facility] [address] [place] [address] [phone]        Anastasio Auerbach. Neveen Daponte   PAC  09/21/2012, 11:41 AM

## 2012-09-21 NOTE — Discharge Summary (Addendum)
Physician Discharge Summary  NAME:[name] [name]  NWG:[id number]  DOB: 10-17-1935   Admit date: 09/18/2012 Discharge date: 09/21/2012  Discharge Diagnoses:  Principal Problem:  Syncope and collapse - likely multiple reasons.  Patient has a postoperative anemia and was also moderately dehydrated on admission.  He also was having severe orthostatic tachycardia on rising causing a drop in blood pressure as well.  Symptomatically improved with rehydration and beta blockade Active Problems:  HTN (hypertension) - controlled, may need to re\re add losartan HCT as anemia improves and activity increases  Benign hypertensive heart disease without heart failure - aware  PSVT (paroxysmal supraventricular tachycardia) - resumed beta blocker therapy  S/P bilateral TKA - continue physical therapy  Expected blood loss anemia - to continue iron therapy. Hemoglobin stable and should start to rise.  Would continue to monitor  Hypotension - continue to monitor  Confusion - resolved  CKD (chronic kidney disease), stage III  Dehydration - resolved    Discharge Physical Exam:  General Appearance: Alert, cooperative, no distress, appears stated age  Weight change:   Intake/Output Summary (Last 24 hours) at 09/21/12 0624 Last data filed at 09/21/12 0550  Gross per 24 hour  Intake    240 ml  Output    400 ml  Net   -160 ml   Filed Vitals:   09/20/12 2155 09/20/12 2157 09/20/12 2202 09/21/12 0549  BP: 157/94 161/81 162/87 129/72  Pulse: 72 73 81 61  Temp:    97.7 F (36.5 C)  TempSrc:    Oral  Resp:    18  Height:      Weight:      SpO2: 97% 100% 100% 98%   General Appearance: Alert, cooperative, no distress, appears stated age  Lungs: Clear to auscultation bilaterally, respirations unlabored  Heart: Regular rate and rhythm, S1 and S2 normal, no murmur, rub or gallop  Abdomen: Soft, non-tender, bowel sounds active all four quadrants, no masses, no organomegaly  Extremities: Lower extremities are  status post bilateral total knee replacements.  Some ecchymosis behind the right lower thigh.  No erythema.  Wounds are clean and dry.  1-2+ edema in both extremities at the level of the knee and below, likely postsurgical  Neuro: alert,nonfocal exam, moves all extremities well   Discharge Condition: Improved/stable for discharge back to SNF for rehabilitation  Hospital Course: Mr. Hobie Kohles is a pleasant 77 year old male who underwent bilateral total knee replacement on 09/05/2012.  He has a history of hypertension, PSVT status post radiofrequency ablation with recurrence, GERD, stage V. kidney disease.  He has been having some problems controlling pain at the nursing home but primarily he was having side effects it seems from opioids.  He is now on tramadol.  On the morning of admission, 09/18/2012, he was sitting on the edge of his bed and felt lightheaded and dizzy and passed out falling back on his bed.  He may have passed out for a couple of minutes.  He was complaining of dyspnea when he came to and was transferred to [facility] long emergency room for evaluation.  His initial blood pressure at the nursing facility was 65/40.  Hemoglobin in the emergency room was 8.8.  D-dimer was positive but CT angiogram of the chest was negative..  Negative cardiac enzymes peaked.  Negative head CT.  Patient was bolused with one half liters of IV fluids and on the second day of admission still was very weak.  Yesterday on standing his heart rate was  varying between 140 and 160 and he was still feeling quite lightheaded.  Beta-blockade was resumed and symptomatically lightheadedness and tachycardia resolved.  Blood pressures are now normal.  He may need to have his losartan HCT resumed at the skilled nursing facility if his blood pressure starts to rise further.  He is still anemic and will need continued iron replacement therapy but overall much improved  Things to follow up in the outpatient setting: Blood pressure  hemoglobin and heart rate and monitor for symptoms of dizziness and shortness of breath  Consults: N/A  Disposition: 03-Skilled Nursing Facility  Discharge Orders   Future Appointments Provider Department Dept Phone   11/05/2012 11:00 AM Cassell Clement, MD [facility] Main Office [place]) [phone]   Future Orders Complete By Expires     Call MD for:  difficulty breathing, headache or visual disturbances  As directed     Call MD for:  extreme fatigue  As directed     Call MD for:  persistant dizziness or light-headedness  As directed     Call MD for:  severe uncontrolled pain  As directed     Call MD for:  temperature >100.4  As directed     Diet - low sodium heart healthy  As directed     Increase activity slowly  As directed         Medication List    STOP taking these medications       losartan-hydrochlorothiazide 100-12.5 MG per tablet  Commonly known as:  HYZAAR      TAKE these medications       celecoxib 200 MG capsule  Commonly known as:  CELEBREX  Take 1 capsule (200 mg total) by mouth every 12 (twelve) hours.     DSS 100 MG Caps  Take 100 mg by mouth 2 (two) times daily.     ferrous sulfate 325 (65 FE) MG tablet  Take 1 tablet (325 mg total) by mouth 3 (three) times daily after meals.     metoprolol tartrate 25 MG tablet  Commonly known as:  LOPRESSOR  Take 1 tablet (25 mg total) by mouth 2 (two) times daily.     omeprazole 20 MG capsule  Commonly known as:  PRILOSEC  Take 20 mg by mouth daily.     polyethylene glycol packet  Commonly known as:  MIRALAX / GLYCOLAX  Take 17 g by mouth 2 (two) times daily.     rivaroxaban 10 MG Tabs tablet  Commonly known as:  XARELTO  Take 1 tablet (10 mg total) by mouth daily.     tiZANidine 4 MG capsule  Commonly known as:  ZANAFLEX  Take 1 capsule (4 mg total) by mouth 3 (three) times daily. Muscle spasms     VITAMIN D PO  Take 1,000 mg by mouth daily.  Tylenol 500 milligrams 2 tablets every 6 hours  when necessary pain       ASK your doctor about these medications                    The results of significant diagnostics from this hospitalization (including imaging, microbiology, ancillary and laboratory) are listed below for reference.    Significant Diagnostic Studies: Dg Chest 2 View  09/08/2012  *RADIOLOGY REPORT*  Clinical Data: Preop total the replacement.  CHEST - 2 VIEW  Comparison: 12/21/2007  Findings: Normal mediastinum and cardiac silhouette.  Normal pulmonary  vasculature.  No evidence of effusion, infiltrate, or pneumothorax.  No  acute bony abnormality. Degenerative osteophytosis of the thoracic spine.  IMPRESSION: No acute cardiopulmonary process.   Original Report Authenticated By: Genevive Bi, M.D.    Ct Head Wo Contrast  09/20/2012  **ADDENDUM** CREATED: 09/20/2012 13:35:53  Typographical error noted in the impression.  Impression should read as follows: "No acute intracranial findings".  **END ADDENDUM** SIGNED BY: Minerva Areola A. Molli Posey, M.D.   09/18/2012  *RADIOLOGY REPORT*  Clinical Data: Loss of consciousness  CT HEAD WITHOUT CONTRAST  Technique:  Contiguous axial images were obtained from the base of the skull through the vertex without contrast.  Comparison: None.  Findings: There is no evidence for acute hemorrhage, hydrocephalus, mass lesion, or abnormal extra-axial fluid collection.  No definite CT evidence of acute infarct. Asymmetry in the temporal tips felt to be related the patient positioning.  Bone windows show the visualized portions of the paranasal sinuses to be clear.  IMPRESSION: No acute cardiopulmonary findings.   Original Report Authenticated By: Kennith Center, M.D.    Ct Angio Chest Pe W/cm &/or Wo Cm  09/18/2012  *RADIOLOGY REPORT*  Clinical Data: Syncope, recent surgery, dyspnea  CT ANGIOGRAPHY CHEST  Technique:  Multidetector CT imaging of the chest using the standard protocol during bolus administration of intravenous contrast. Multiplanar  reconstructed images including MIPs were obtained and reviewed to evaluate the vascular anatomy.  Contrast: OMNIPAQUE IOHEXOL 350 MG/ML SOLN  Comparison: None.  Findings: Small hiatal hernia is noted.  Sagittal images of the spine shows degenerative changes thoracic spine. A distended gallbladder is noted without evidence of calcified gallstones.  Adrenal glands are unremarkable.  No central pulmonary embolus is noted.  There is suboptimal contrast opacification of the sub segmental arterial branches.  No mediastinal hematoma or adenopathy. Atherosclerotic calcifications of the coronary arteries.  The heart size is within normal limits. Trace pericardial effusion or thickening anteriorly.  Images of the lung parenchyma shows no acute infiltrate or pulmonary edema. Mild dependent atelectasis noted posteriorly.  IMPRESSION:  1.  No central pulmonary embolus is noted. 2.  Small hiatal hernia. 3.  No acute infiltrate or pulmonary edema. 4.  Distended gallbladder without evidence of calcified gallstones. 5.  Degenerative changes thoracic spine.   Original Report Authenticated By: Natasha Mead, M.D.    Dg Chest Port 1 View  09/18/2012  *RADIOLOGY REPORT*  Clinical Data: Syncope.  Tachycardia.  PORTABLE CHEST - 1 VIEW  Comparison: 09/08/2012  Findings: 0941 hours. Cardiopericardial silhouette is at upper limits of normal for size. No evidence for pulmonary edema or focal airspace consolidation.  No pleural effusion. Imaged bony structures of the thorax are intact.  Degenerative changes are noted in both shoulders. Telemetry leads overlie the chest.  IMPRESSION: Borderline cardiomegaly with vascular congestion.   Original Report Authenticated By: Kennith Center, M.D.     Microbiology: Recent Results (from the past 240 hour(s))  MRSA PCR SCREENING     Status: None   Collection Time    09/18/12  4:06 PM      Result Value Range Status   MRSA by PCR NEGATIVE  NEGATIVE Final   Comment:            The GeneXpert MRSA  Assay (FDA     approved for NASAL specimens     only), is one component of a     comprehensive MRSA colonization     surveillance program. It is not     intended to diagnose MRSA     infection nor to guide or  monitor treatment for     MRSA infections.     Labs: Results for orders placed during the hospital encounter of 09/18/12  MRSA PCR SCREENING      Result Value Range   MRSA by PCR NEGATIVE  NEGATIVE  CBC WITH DIFFERENTIAL      Result Value Range   WBC 7.4  4.0 - 10.5 K/uL   RBC 2.84 (*) 4.22 - 5.81 MIL/uL   Hemoglobin 8.8 (*) 13.0 - 17.0 g/dL   HCT 16.1 (*) 09.6 - 04.5 %   MCV 91.5  78.0 - 100.0 fL   MCH 31.0  26.0 - 34.0 pg   MCHC 33.8  30.0 - 36.0 g/dL   RDW 40.9  81.1 - 91.4 %   Platelets 179  150 - 400 K/uL   Neutrophils Relative 73  43 - 77 %   Neutro Abs 5.5  1.7 - 7.7 K/uL   Lymphocytes Relative 12  12 - 46 %   Lymphs Abs 0.9  0.7 - 4.0 K/uL   Monocytes Relative 10  3 - 12 %   Monocytes Absolute 0.8  0.1 - 1.0 K/uL   Eosinophils Relative 4  0 - 5 %   Eosinophils Absolute 0.3  0.0 - 0.7 K/uL   Basophils Relative 0  0 - 1 %   Basophils Absolute 0.0  0.0 - 0.1 K/uL  COMPREHENSIVE METABOLIC PANEL      Result Value Range   Sodium 135  135 - 145 mEq/L   Potassium 4.2  3.5 - 5.1 mEq/L   Chloride 101  96 - 112 mEq/L   CO2 26  19 - 32 mEq/L   Glucose, Bld 108 (*) 70 - 99 mg/dL   BUN 31 (*) 6 - 23 mg/dL   Creatinine, Ser 7.82 (*) 0.50 - 1.35 mg/dL   Calcium 8.4  8.4 - 95.6 mg/dL   Total Protein 5.4 (*) 6.0 - 8.3 g/dL   Albumin 2.7 (*) 3.5 - 5.2 g/dL   AST 34  0 - 37 U/L   ALT 26  0 - 53 U/L   Alkaline Phosphatase 74  39 - 117 U/L   Total Bilirubin 0.8  0.3 - 1.2 mg/dL   GFR calc non Af Amer 45 (*) >90 mL/min   GFR calc Af Amer 52 (*) >90 mL/min  URINALYSIS, ROUTINE W REFLEX MICROSCOPIC      Result Value Range   Color, Urine YELLOW  YELLOW   APPearance CLEAR  CLEAR   Specific Gravity, Urine 1.016  1.005 - 1.030   pH 6.5  5.0 - 8.0   Glucose, UA  NEGATIVE  NEGATIVE mg/dL   Hgb urine dipstick NEGATIVE  NEGATIVE   Bilirubin Urine NEGATIVE  NEGATIVE   Ketones, ur NEGATIVE  NEGATIVE mg/dL   Protein, ur NEGATIVE  NEGATIVE mg/dL   Urobilinogen, UA 0.2  0.0 - 1.0 mg/dL   Nitrite NEGATIVE  NEGATIVE   Leukocytes, UA NEGATIVE  NEGATIVE  LACTIC ACID, PLASMA      Result Value Range   Lactic Acid, Venous 1.1  0.5 - 2.2 mmol/L  URINE RAPID DRUG SCREEN (HOSP PERFORMED)      Result Value Range   Opiates POSITIVE (*) NONE DETECTED   Cocaine NONE DETECTED  NONE DETECTED   Benzodiazepines NONE DETECTED  NONE DETECTED   Amphetamines NONE DETECTED  NONE DETECTED   Tetrahydrocannabinol NONE DETECTED  NONE DETECTED   Barbiturates NONE DETECTED  NONE DETECTED  D-DIMER, QUANTITATIVE  Result Value Range   D-Dimer, Quant 1.58 (*) 0.00 - 0.48 ug/mL-FEU  PROTIME-INR      Result Value Range   Prothrombin Time 15.0  11.6 - 15.2 seconds   INR 1.20  0.00 - 1.49  GLUCOSE, CAPILLARY      Result Value Range   Glucose-Capillary 103 (*) 70 - 99 mg/dL  BASIC METABOLIC PANEL      Result Value Range   Sodium 135  135 - 145 mEq/L   Potassium 3.9  3.5 - 5.1 mEq/L   Chloride 103  96 - 112 mEq/L   CO2 24  19 - 32 mEq/L   Glucose, Bld 100 (*) 70 - 99 mg/dL   BUN 22  6 - 23 mg/dL   Creatinine, Ser 1.61  0.50 - 1.35 mg/dL   Calcium 7.9 (*) 8.4 - 10.5 mg/dL   GFR calc non Af Amer 60 (*) >90 mL/min   GFR calc Af Amer 70 (*) >90 mL/min  CBC      Result Value Range   WBC 6.0  4.0 - 10.5 K/uL   RBC 2.65 (*) 4.22 - 5.81 MIL/uL   Hemoglobin 8.0 (*) 13.0 - 17.0 g/dL   HCT 09.6 (*) 04.5 - 40.9 %   MCV 90.9  78.0 - 100.0 fL   MCH 30.2  26.0 - 34.0 pg   MCHC 33.2  30.0 - 36.0 g/dL   RDW 81.1  91.4 - 78.2 %   Platelets 183  150 - 400 K/uL  OCCULT BLOOD X 1 CARD TO LAB, STOOL      Result Value Range   Fecal Occult Bld NEGATIVE  NEGATIVE  OCCULT BLOOD X 1 CARD TO LAB, STOOL      Result Value Range   Fecal Occult Bld NEGATIVE  NEGATIVE  CBC      Result Value  Range   WBC 5.0  4.0 - 10.5 K/uL   RBC 2.86 (*) 4.22 - 5.81 MIL/uL   Hemoglobin 8.5 (*) 13.0 - 17.0 g/dL   HCT 95.6 (*) 21.3 - 08.6 %   MCV 91.6  78.0 - 100.0 fL   MCH 29.7  26.0 - 34.0 pg   MCHC 32.4  30.0 - 36.0 g/dL   RDW 57.8  46.9 - 62.9 %   Platelets 220  150 - 400 K/uL  BASIC METABOLIC PANEL      Result Value Range   Sodium 139  135 - 145 mEq/L   Potassium 3.6  3.5 - 5.1 mEq/L   Chloride 106  96 - 112 mEq/L   CO2 25  19 - 32 mEq/L   Glucose, Bld 104 (*) 70 - 99 mg/dL   BUN 17  6 - 23 mg/dL   Creatinine, Ser 5.28  0.50 - 1.35 mg/dL   Calcium 8.4  8.4 - 41.3 mg/dL   GFR calc non Af Amer 52 (*) >90 mL/min   GFR calc Af Amer 60 (*) >90 mL/min  CBC WITH DIFFERENTIAL      Result Value Range   WBC 6.4  4.0 - 10.5 K/uL   RBC 2.79 (*) 4.22 - 5.81 MIL/uL   Hemoglobin 8.3 (*) 13.0 - 17.0 g/dL   HCT 24.4 (*) 01.0 - 27.2 %   MCV 92.1  78.0 - 100.0 fL   MCH 29.7  26.0 - 34.0 pg   MCHC 32.3  30.0 - 36.0 g/dL   RDW 53.6  64.4 - 03.4 %   Platelets 235  150 - 400  K/uL   Neutrophils Relative 59  43 - 77 %   Neutro Abs 3.8  1.7 - 7.7 K/uL   Lymphocytes Relative 21  12 - 46 %   Lymphs Abs 1.4  0.7 - 4.0 K/uL   Monocytes Relative 11  3 - 12 %   Monocytes Absolute 0.7  0.1 - 1.0 K/uL   Eosinophils Relative 7 (*) 0 - 5 %   Eosinophils Absolute 0.5  0.0 - 0.7 K/uL   Basophils Relative 1  0 - 1 %   Basophils Absolute 0.0  0.0 - 0.1 K/uL  BASIC METABOLIC PANEL      Result Value Range   Sodium 141  135 - 145 mEq/L   Potassium 4.2  3.5 - 5.1 mEq/L   Chloride 109  96 - 112 mEq/L   CO2 24  19 - 32 mEq/L   Glucose, Bld 100 (*) 70 - 99 mg/dL   BUN 18  6 - 23 mg/dL   Creatinine, Ser 1.61  0.50 - 1.35 mg/dL   Calcium 8.4  8.4 - 09.6 mg/dL   GFR calc non Af Amer 56 (*) >90 mL/min   GFR calc Af Amer 65 (*) >90 mL/min  POCT I-STAT, CHEM 8      Result Value Range   Sodium 136  135 - 145 mEq/L   Potassium 4.2  3.5 - 5.1 mEq/L   Chloride 103  96 - 112 mEq/L   BUN 29 (*) 6 - 23 mg/dL    Creatinine, Ser 0.45 (*) 0.50 - 1.35 mg/dL   Glucose, Bld 409 (*) 70 - 99 mg/dL   Calcium, Ion 8.11  9.14 - 1.30 mmol/L   TCO2 24  0 - 100 mmol/L   Hemoglobin 8.2 (*) 13.0 - 17.0 g/dL   HCT 78.2 (*) 95.6 - 21.3 %  TYPE AND SCREEN      Result Value Range   ABO/RH(D) B NEG     Antibody Screen NEG     Sample Expiration 09/21/2012     Unit Number [id number]     Blood Component Type RED CELLS,LR     Unit division 00     Status of Unit ALLOCATED     Transfusion Status OK TO TRANSFUSE     Crossmatch Result Compatible     Unit Number [id number]     Blood Component Type RED CELLS,LR     Unit division 00     Status of Unit ALLOCATED     Transfusion Status OK TO TRANSFUSE     Crossmatch Result Compatible    PREPARE RBC (CROSSMATCH)      Result Value Range   Order Confirmation ORDER PROCESSED BY BLOOD BANK      Time coordinating discharge: 35 minutes  Signed: Pearla Dubonnet, MD 09/21/2012, 6:24 AM

## 2012-09-21 NOTE — Progress Notes (Signed)
Physical Therapy Treatment Patient Details Name: [name] [name] MRN: [id number] DOB: [birth year] Today's Date: 09/21/2012 Time: 4098-1191 PT Time Calculation (min): 25 min  PT Assessment / Plan / Recommendation Comments on Treatment Session  Pt doing great with ambulation and performed a few exercises in recliner. HR remained under 101 during session.  Pt eager to return to [place] today.    Follow Up Recommendations  SNF     Does the patient have the potential to tolerate intense rehabilitation     Barriers to Discharge        Equipment Recommendations  None recommended by PT    Recommendations for Other Services    Frequency     Plan Discharge plan remains appropriate;Frequency remains appropriate    Precautions / Restrictions Precautions Precautions: Knee;Fall Restrictions RLE Weight Bearing: Weight bearing as tolerated LLE Weight Bearing: Weight bearing as tolerated   Pertinent Vitals/Pain n/a    Mobility  Bed Mobility Bed Mobility: Supine to Sit Supine to Sit: 6: Modified independent (Device/Increase time) Details for Bed Mobility Assistance: increased time Transfers Transfers: Sit to Stand;Stand to Sit Sit to Stand: From elevated surface;With upper extremity assist;From bed;5: Supervision Stand to Sit: With upper extremity assist;5: Supervision;To chair/3-in-1 Details for Transfer Assistance:  Pt attempted to pull up on RW when standing and given verbal cue to use bed however pt states easier to hold RW so educated pt if he does hold RW to stand then another person needs to hold RW down for safety Ambulation/Gait Ambulation/Gait Assistance: 5: Supervision Ambulation Distance (Feet): 400 Feet Assistive device: Rolling walker Ambulation/Gait Assistance Details: highest HR 101 during ambulation otherwise below, pt doing great with mobility and likely to advance to using cane soon (states he had starting using cane at [facility] prior to readmission) Gait Pattern:  Step-through pattern;Decreased stride length Gait velocity: decreased    Exercises Total Joint Exercises Short Arc Quad: AROM;Both;10 reps;Supine Heel Slides: AROM;Both;10 reps;Seated Hip ABduction/ADduction: AROM;Both;10 reps;Supine Straight Leg Raises: AROM;Both;10 reps;Supine Goniometric ROM: active ROM sitting edge of chair: L 0-100* and R 0-95* (after performing heel slides)   PT Diagnosis:    PT Problem List:   PT Treatment Interventions:     PT Goals Acute Rehab PT Goals PT Goal Formulation: With patient Time For Goal Achievement: 09/28/12 Potential to Achieve Goals: Good Pt will go Sit to Stand: with modified independence PT Goal: Sit to Stand - Progress: Progressing toward goal Pt will go Stand to Sit: with modified independence PT Goal: Stand to Sit - Progress: Progressing toward goal Pt will Ambulate: >150 feet;with modified independence;with least restrictive assistive device PT Goal: Ambulate - Progress: Progressing toward goal Pt will Perform Home Exercise Program: with supervision, verbal cues required/provided PT Goal: Perform Home Exercise Program - Progress: Progressing toward goal  Visit Information  Last PT Received On: 09/21/12 Assistance Needed: +1    Subjective Data  Subjective: They said I could go back to [place] today   Cognition  Cognition Arousal/Alertness: Awake/alert Behavior During Therapy: [facility] for tasks assessed/performed Overall Cognitive Status: Within Functional Limits for tasks assessed    Balance     End of Session PT - End of Session Activity Tolerance: Patient tolerated treatment well Patient left: with call bell/phone within reach;in chair   GP     Xavior Niazi,KATHrine E 09/21/2012, 11:32 AM Zenovia Jarred, PT, DPT 09/21/2012 Pager: [phone]

## 2012-09-21 NOTE — Progress Notes (Signed)
Patient cleared for discharge. Packet copied and placed in [place]. Patient and spouse agreeable to discharge. Patient son to transport. No further CSW needs noted.  ,Dontrail Blackwell C. Trinitey Roache MSW, LCSW [phone]

## 2012-09-22 LAB — TYPE AND SCREEN
ABO/RH(D): B NEG
Antibody Screen: NEGATIVE
Unit division: 0

## 2012-09-28 ENCOUNTER — Non-Acute Institutional Stay (SKILLED_NURSING_FACILITY): Payer: Medicare Other | Admitting: Internal Medicine

## 2012-09-28 DIAGNOSIS — I150 Renovascular hypertension: Secondary | ICD-10-CM | POA: Diagnosis not present

## 2012-09-28 DIAGNOSIS — D62 Acute posthemorrhagic anemia: Secondary | ICD-10-CM | POA: Diagnosis not present

## 2012-09-28 DIAGNOSIS — N183 Chronic kidney disease, stage 3 unspecified: Secondary | ICD-10-CM

## 2012-09-28 DIAGNOSIS — M1710 Unilateral primary osteoarthritis, unspecified knee: Secondary | ICD-10-CM

## 2012-09-29 ENCOUNTER — Non-Acute Institutional Stay (SKILLED_NURSING_FACILITY): Payer: Medicare Other | Admitting: Adult Health

## 2012-09-29 DIAGNOSIS — I959 Hypotension, unspecified: Secondary | ICD-10-CM

## 2012-09-29 DIAGNOSIS — D62 Acute posthemorrhagic anemia: Secondary | ICD-10-CM | POA: Diagnosis not present

## 2012-09-29 DIAGNOSIS — I10 Essential (primary) hypertension: Secondary | ICD-10-CM | POA: Diagnosis not present

## 2012-09-29 DIAGNOSIS — I471 Supraventricular tachycardia: Secondary | ICD-10-CM | POA: Diagnosis not present

## 2012-09-29 DIAGNOSIS — Z96659 Presence of unspecified artificial knee joint: Secondary | ICD-10-CM | POA: Diagnosis not present

## 2012-10-01 ENCOUNTER — Non-Acute Institutional Stay (SKILLED_NURSING_FACILITY): Payer: Medicare Other | Admitting: Adult Health

## 2012-10-01 DIAGNOSIS — I471 Supraventricular tachycardia: Secondary | ICD-10-CM | POA: Diagnosis not present

## 2012-10-01 DIAGNOSIS — K219 Gastro-esophageal reflux disease without esophagitis: Secondary | ICD-10-CM

## 2012-10-01 DIAGNOSIS — K5900 Constipation, unspecified: Secondary | ICD-10-CM

## 2012-10-01 DIAGNOSIS — M1990 Unspecified osteoarthritis, unspecified site: Secondary | ICD-10-CM

## 2012-10-01 DIAGNOSIS — Z96659 Presence of unspecified artificial knee joint: Secondary | ICD-10-CM | POA: Diagnosis not present

## 2012-10-01 DIAGNOSIS — I959 Hypotension, unspecified: Secondary | ICD-10-CM | POA: Diagnosis not present

## 2012-10-01 DIAGNOSIS — I10 Essential (primary) hypertension: Secondary | ICD-10-CM

## 2012-10-01 DIAGNOSIS — D62 Acute posthemorrhagic anemia: Secondary | ICD-10-CM

## 2012-10-01 DIAGNOSIS — Z96653 Presence of artificial knee joint, bilateral: Secondary | ICD-10-CM

## 2012-10-05 ENCOUNTER — Encounter: Payer: Self-pay | Admitting: Adult Health

## 2012-10-05 DIAGNOSIS — K219 Gastro-esophageal reflux disease without esophagitis: Secondary | ICD-10-CM | POA: Insufficient documentation

## 2012-10-05 DIAGNOSIS — D62 Acute posthemorrhagic anemia: Secondary | ICD-10-CM | POA: Insufficient documentation

## 2012-10-05 DIAGNOSIS — M1710 Unilateral primary osteoarthritis, unspecified knee: Secondary | ICD-10-CM | POA: Diagnosis not present

## 2012-10-05 DIAGNOSIS — K5900 Constipation, unspecified: Secondary | ICD-10-CM | POA: Insufficient documentation

## 2012-10-05 NOTE — Progress Notes (Signed)
  Subjective:    Patient ID: [name] [name], male    DOB: 10-24-1935, 77 y.o.   MRN: [id number]  HPI This is a 77 year old male who is for discharge home and will have outpatient rehabilitation. He has been readmitted to [facility] on 09/21/12 from [facility] with end-stage arthritis S/P bilateral total knee arthroplasty. He has been admitted for a short-term rehabilitation.     Review of Systems  Constitutional: Negative.   HENT: Negative.   Eyes: Negative.   Respiratory: Negative for cough and shortness of breath.   Cardiovascular: Negative for leg swelling.  Gastrointestinal: Negative for abdominal pain and abdominal distention.  Endocrine: Negative.   Genitourinary: Negative.   Neurological: Negative.   Hematological: Negative for adenopathy. Does not bruise/bleed easily.  Psychiatric/Behavioral: Negative.        Objective:   Physical Exam  Constitutional: He is oriented to person, place, and time. He appears well-developed and well-nourished.  HENT:  Head: Normocephalic and atraumatic.  Right Ear: External ear normal.  Left Ear: External ear normal.  Eyes: Conjunctivae and EOM are normal. Pupils are equal, round, and reactive to light.  Neck: Normal range of motion. Neck supple. No thyromegaly present.  Cardiovascular: Normal rate, regular rhythm and normal heart sounds.   Pulmonary/Chest: Effort normal and breath sounds normal. No respiratory distress.  Abdominal: Soft. Bowel sounds are normal. He exhibits no distension.  Musculoskeletal: Normal range of motion. He exhibits no edema and no tenderness.  Neurological: He is alert and oriented to person, place, and time.  Skin: Skin is warm and dry.  Psychiatric: He has a normal mood and affect. His behavior is normal. Judgment and thought content normal.   LABS: 09/28/12  Wbc 6.2  hgb 9.7  hct 29.4  Bmp nl 09/22/12  Bmp nl except glucose 103  Calcium 8.1 09/17/12  Wbc 12.9  hgb 9.9  hct 29.1  NA 136  K 4.3  Glucose  114  BUN 26  Creatinine 1.47   Calcium 7.9   Medications reviewed per [facility]      Assessment & Plan:   Acute blood loss anemia - stable; continue FeSO4  Unspecified constipation - no complaints of; continue Miralax  GERD (gastroesophageal reflux disease) - stable; continue Prilosec  Hypotension - well-controlled; continue Lopressor  CKD (chronic kidney disease), stage III - stable  PSVT (paroxysmal supraventricular tachycardia) - rate-controlled  Osteoarthritis S/P Bilateral knee arthroplasty - will have outpatient rehabilitation

## 2012-10-05 NOTE — Progress Notes (Signed)
  Subjective:    Patient ID: [name] [name], male    DOB: Aug 30, 1935, 77 y.o.   MRN: [id number]  HPI This is a 77 year old male who reported that he "thought he would pass out again, just like last time." Patient was recently hospitalized after a syncopal episode. Patient is currently taking Tylenol for pain and is S/P bilateral total knee arthroplasty. He is here at [facility] for a short-term rehabilitation.   Review of Systems  Constitutional: Negative.   HENT: Negative.   Eyes: Negative.   Respiratory: Negative.   Cardiovascular: Negative for leg swelling.  Gastrointestinal: Negative for abdominal distention.  Endocrine: Negative.   Genitourinary: Negative.   Neurological: Negative.   Hematological: Negative for adenopathy. Does not bruise/bleed easily.  Psychiatric/Behavioral: Negative.        Objective:   Physical Exam  Constitutional: He is oriented to person, place, and time. He appears well-developed and well-nourished.  HENT:  Head: Normocephalic.  Right Ear: External ear normal.  Left Ear: External ear normal.  Eyes: Conjunctivae and EOM are normal. Pupils are equal, round, and reactive to light.  Neck: Normal range of motion. Neck supple. No thyromegaly present.  Cardiovascular: Normal rate, regular rhythm and normal heart sounds.   Pulmonary/Chest: Effort normal and breath sounds normal. No respiratory distress.  Abdominal: Soft. Bowel sounds are normal. He exhibits no distension.  Musculoskeletal: Normal range of motion. He exhibits no edema and no tenderness.  Neurological: He is alert and oriented to person, place, and time.  Skin: Skin is warm and dry.  Psychiatric: He has a normal mood and affect. His behavior is normal. Judgment and thought content normal.   LABS: 09/28/12  Wbc 6.2  hgb 9.7  hct 29.4  Bmp nl  Medications reviewed per [facility]      Assessment & Plan:   Hypotension -  EKG; BP/HR Q shift

## 2012-10-06 DIAGNOSIS — M1710 Unilateral primary osteoarthritis, unspecified knee: Secondary | ICD-10-CM | POA: Diagnosis not present

## 2012-10-08 ENCOUNTER — Telehealth: Payer: Self-pay | Admitting: Cardiology

## 2012-10-08 DIAGNOSIS — M1710 Unilateral primary osteoarthritis, unspecified knee: Secondary | ICD-10-CM | POA: Diagnosis not present

## 2012-10-08 NOTE — Telephone Encounter (Signed)
Patients medications were recently changed when he was in the hospital and at rehab facility. Patient is having issues with hypertension as well as hypotension. Patient would personally like to speak with  Dr. Patty Sermons, aware he is out of office and will not be back until next week. Will forward to  Dr. Patty Sermons for review

## 2012-10-08 NOTE — Telephone Encounter (Signed)
New Problem  Pt states he had bilateral knee surgery and for the past two wks his blood pressure has been fluctuating. He states it is usually only once a day. He said the highest is has gotten was 183/84.  He wants to discuss the changes in the medication that was done.

## 2012-10-12 DIAGNOSIS — M1710 Unilateral primary osteoarthritis, unspecified knee: Secondary | ICD-10-CM | POA: Diagnosis not present

## 2012-10-12 NOTE — Telephone Encounter (Signed)
New problem     Patient did not want to disclose any information.

## 2012-10-12 NOTE — Telephone Encounter (Signed)
I called [name].  His spells of extreme low blood pressure or felt to be secondary to the tizanidine.  He will start to taper off the medication by taking just half a tablet 3 times a day and then cutting back further from there.

## 2012-10-13 DIAGNOSIS — M1710 Unilateral primary osteoarthritis, unspecified knee: Secondary | ICD-10-CM | POA: Diagnosis not present

## 2012-10-15 DIAGNOSIS — M1710 Unilateral primary osteoarthritis, unspecified knee: Secondary | ICD-10-CM | POA: Diagnosis not present

## 2012-10-18 DIAGNOSIS — M1710 Unilateral primary osteoarthritis, unspecified knee: Secondary | ICD-10-CM | POA: Diagnosis not present

## 2012-10-19 DIAGNOSIS — I150 Renovascular hypertension: Secondary | ICD-10-CM | POA: Insufficient documentation

## 2012-10-19 DIAGNOSIS — R55 Syncope and collapse: Secondary | ICD-10-CM | POA: Diagnosis not present

## 2012-10-19 DIAGNOSIS — M1710 Unilateral primary osteoarthritis, unspecified knee: Secondary | ICD-10-CM | POA: Insufficient documentation

## 2012-10-19 DIAGNOSIS — T753XXA Motion sickness, initial encounter: Secondary | ICD-10-CM | POA: Diagnosis not present

## 2012-10-19 DIAGNOSIS — D649 Anemia, unspecified: Secondary | ICD-10-CM | POA: Diagnosis not present

## 2012-10-19 DIAGNOSIS — Z0000 Encounter for general adult medical examination without abnormal findings: Secondary | ICD-10-CM | POA: Diagnosis not present

## 2012-10-19 DIAGNOSIS — K219 Gastro-esophageal reflux disease without esophagitis: Secondary | ICD-10-CM | POA: Diagnosis not present

## 2012-10-19 DIAGNOSIS — Z1331 Encounter for screening for depression: Secondary | ICD-10-CM | POA: Diagnosis not present

## 2012-10-19 DIAGNOSIS — Z79899 Other long term (current) drug therapy: Secondary | ICD-10-CM | POA: Diagnosis not present

## 2012-10-19 DIAGNOSIS — E559 Vitamin D deficiency, unspecified: Secondary | ICD-10-CM | POA: Diagnosis not present

## 2012-10-19 DIAGNOSIS — I10 Essential (primary) hypertension: Secondary | ICD-10-CM | POA: Diagnosis not present

## 2012-10-19 DIAGNOSIS — H9319 Tinnitus, unspecified ear: Secondary | ICD-10-CM | POA: Diagnosis not present

## 2012-10-19 NOTE — Progress Notes (Signed)
Patient ID: [name] [name], male   DOB: June 24, 1935, 77 y.o.   MRN: [id number]        HISTORY & PHYSICAL  DATE: 09/28/2012   FACILITY: [facility]  LEVEL OF CARE: SNF (31)  ALLERGIES:  Allergies  Allergen Reactions  . Hydrocodone-Acetaminophen Other (See Comments)    Confusion  . Ramipril     Cough    CHIEF COMPLAINT:  Manage bilateral knee osteoarthritis, hypertension, and chronic kidney disease stage III.    HISTORY OF PRESENT ILLNESS:  The patient is a 77 year-old, Caucasian male.    KNEE OSTEOARTHRITIS: Patient had a history of pain and functional disability in the knee due to end-stage osteoarthritis and has failed nonsurgical conservative treatments. Patient had worsening of pain with activity and weight bearing, pain that interfered with activities of daily living & pain with passive range of motion. Therefore patient underwent bilateral total knee arthroplasty and tolerated the procedure well. Patient is admitted to this facility for sort short-term rehabilitation. Patient denies knee pain.   HTN: [name] HTN remains stable.  Denies CP, sob, DOE, pedal edema, headaches, dizziness or visual disturbances.  No complications from the medications currently being used.  Last BP : 130/77, 130/69, 108/63.   CHRONIC KIDNEY DISEASE: The patient has a history of chronic kidney disease stage III.  The patient's chronic kidney disease remains stable.  Patient denies increasing lower extremity swelling or confusion. Last BUN and creatinine are:  Last renal functions normal.  PAST MEDICAL HISTORY :  Past Medical History  Diagnosis Date  . Hypertension   . History of paroxysmal supraventricular tachycardia     [name]  . Vertigo, benign positional     [name]  . Palpitations     History of ventricular ectopy. He is status post radiofrequency ablation at the [facility] in the early 90's.  for PVC's  . BPH (benign prostatic hyperplasia)     s/p TURP in July 2012  . Aortic sclerosis     MILD PER ECHO AS WELL AS TRACE MITRAL REGURGITATION - PER CARDIOLOGY OFFICE NOTES 06/2012  . EKG abnormalities     INCOMPLETE RIGHT BUNDLE BRANCH BLOCK AND CHRONIC T-WAVE ABNORMALITIES - PER CARDIOLOGY OFFICE NOTES 06/2012  . Complication of anesthesia     HX OF MINOR ITCHING AFTER ANESTHESIA FOR HIP REPLACEMENT AND FOR COLON SURGERY  . GERD (gastroesophageal reflux disease)   . Arthritis     PAST SURGICAL HISTORY: Past Surgical History  Procedure Laterality Date  . Hernia repair  1990  . Ankle surgery    . Tonsillectomy  Age 86  . Kidney stone surgery    . US echocardiography  06-20-2009    Est EF 55-60%  . Cardiac catheterization  2002    NORMAL CORONARY ARTERIES  . Joint replacement  ? 2009    LEFT HIP REPLACEMENT  . Colon surgery  ? 1994  . Transurethral resection of prostate    . Total knee arthroplasty Bilateral 09/13/2012    Procedure: TOTAL KNEE BILATERAL;  Surgeon: [name], MD;  Location: [facility]S;  Service: Orthopedics;  Laterality: Bilateral;    SOCIAL HISTORY:  reports that he has never smoked. He does not have any smokeless tobacco history on file. He reports that  drinks alcohol. He reports that he does not use illicit drugs.  FAMILY HISTORY:  Family History  Problem Relation  Age of Onset  . Other Neg Hx     CURRENT MEDICATIONS: Reviewed per [facility]  REVIEW OF SYSTEMS:  See HPI otherwise 14 point ROS is negative.  PHYSICAL EXAMINATION  VS:  T 96.4       P 63      RR 20     BP 130/77       POX 97% room air       WT (Lb)  GENERAL: no acute distress, normal body habitus EYES: conjunctivae normal, sclerae normal, normal eye lids MOUTH/THROAT: lips without lesions,no lesions in the mouth,tongue is without lesions,uvula elevates in midline NECK: supple, trachea midline, no neck masses, no thyroid tenderness, no thyromegaly LYMPHATICS: no LAN in the neck, no  supraclavicular LAN RESPIRATORY: breathing is even & unlabored, BS CTAB CARDIAC: RRR, no murmur,no extra heart sounds, no edema GI:  ABDOMEN: abdomen soft, normal BS, no masses, no tenderness  LIVER/SPLEEN: no hepatomegaly, no splenomegaly MUSCULOSKELETAL: HEAD: normal to inspection & palpation BACK: no kyphosis, scoliosis or spinal processes tenderness EXTREMITIES: LEFT UPPER EXTREMITY: full range of motion, normal strength & tone RIGHT UPPER EXTREMITY:  full range of motion, normal strength & tone LEFT LOWER EXTREMITY: strength intact, range of motion not tested due to surgery  RIGHT LOWER EXTREMITY: strength intact, range of motion not tested due to surgery  PSYCHIATRIC: the patient is alert & oriented to person, affect & behavior appropriate  LABS/RADIOLOGY: BMP normal.   WBC 12.9, hemoglobin 9.9, MCV 89, platelets 211.   08/29/2012:   WBC 10.9, hemoglobin 9.2.  ASSESSMENT/PLAN:  Bilateral knee osteoarthritis.  Status post bilateral total knee arthroplasty.  Continue rehabilitation.   Renovascular hypertension.  Stable.   Chronic kidney disease stage III.  Renal functions have normalized.    Acute blood loss anemia.  Hemoglobin improved.    Leukocytosis.  Reassess.   SVT.  Rate controlled.   Constipation.  Well controlled.   Hypokalemia.  Well repleted.  Continue supplementation.    I have reviewed patient's medical records received at admission/from hospitalization.  CPT CODE: 45409

## 2012-10-25 DIAGNOSIS — M1710 Unilateral primary osteoarthritis, unspecified knee: Secondary | ICD-10-CM | POA: Diagnosis not present

## 2012-10-27 DIAGNOSIS — M1710 Unilateral primary osteoarthritis, unspecified knee: Secondary | ICD-10-CM | POA: Diagnosis not present

## 2012-10-29 DIAGNOSIS — Z96659 Presence of unspecified artificial knee joint: Secondary | ICD-10-CM | POA: Diagnosis not present

## 2012-11-02 DIAGNOSIS — M25519 Pain in unspecified shoulder: Secondary | ICD-10-CM | POA: Diagnosis not present

## 2012-11-05 ENCOUNTER — Ambulatory Visit (INDEPENDENT_AMBULATORY_CARE_PROVIDER_SITE_OTHER): Payer: Medicare Other | Admitting: Cardiology

## 2012-11-05 ENCOUNTER — Encounter: Payer: Self-pay | Admitting: Cardiology

## 2012-11-05 VITALS — BP 132/74 | HR 60 | Ht 72.0 in | Wt 215.0 lb

## 2012-11-05 DIAGNOSIS — I498 Other specified cardiac arrhythmias: Secondary | ICD-10-CM | POA: Diagnosis not present

## 2012-11-05 DIAGNOSIS — R55 Syncope and collapse: Secondary | ICD-10-CM

## 2012-11-05 DIAGNOSIS — I119 Hypertensive heart disease without heart failure: Secondary | ICD-10-CM | POA: Diagnosis not present

## 2012-11-05 DIAGNOSIS — I471 Supraventricular tachycardia, unspecified: Secondary | ICD-10-CM

## 2012-11-05 NOTE — Assessment & Plan Note (Signed)
The patient has had no recurrent symptoms or signs of SVT.  He will be taking his metoprolol tartrate 25 mg twice a day.

## 2012-11-05 NOTE — Patient Instructions (Addendum)
Take Lopressor to 50 mg 1/2 twice a day.    Your physician wants you to follow-up in: 4 months. You will receive a reminder letter in the mail two months in advance. If you don't receive a letter, please call our office to schedule the follow-up appointment.

## 2012-11-05 NOTE — Assessment & Plan Note (Signed)
The patient has had no further episodes of dizziness or syncope.  He has had some mild paresthesias which may be secondary to having started on acetazolamide  for prevention of mountain sickness

## 2012-11-05 NOTE — Assessment & Plan Note (Signed)
Blood pressure was remaining stable on current therapy 

## 2012-11-05 NOTE — Progress Notes (Signed)
[name] [name] Date of Birth:  [birth year] [facility] [address] [place], [place]  [address] [phone]  Fax   [phone]  HPI: This pleasant 77 year old gentleman is seen for a followup office visit. He has a past history of essential hypertension and a past history of paroxysmal supraventricular tachycardia. He does not have any history of ischemic heart disease. He had a cardiac catheterization in 2001 showing normal coronary arteries he had a normal nuclear stress test in 2008. He had an echocardiogram in 2011 which showed mild LVH with normal systolic function and with impaired relaxation. He has mild aortic sclerosis by echo as well as trace mitral regurgitation.  Since we last saw him he had double knee replacement by Dr. Charlann Boxer.  He has done well postoperatively.  He went initially to a nursing home for rehabilitation.  He was readmitted briefly to the hospital because of severe dizziness and syncope related to pain medication.  He is now home and feeling well.  He is preparing to leave for a family vacation in [place] tomorrow   Current Outpatient Prescriptions  Medication Sig Dispense Refill  . acetaminophen (TYLENOL) 500 MG tablet Take 1,000 mg by mouth every 6 (six) hours as needed for pain.      Marland Kitchen acetaZOLAMIDE (DIAMOX) 250 MG tablet Take 250 mg by mouth daily. For 5 days      . aspirin 81 MG tablet Take 81 mg by mouth daily.      . Cholecalciferol (VITAMIN D PO) Take 1,000 mg by mouth daily.       Marland Kitchen docusate sodium 100 MG CAPS Take 100 mg by mouth 2 (two) times daily.  10 capsule    . Lansoprazole (PREVACID PO) Take by mouth as needed.      . potassium citrate (UROCIT-K) 10 MEQ (1080 MG) SR tablet Take 10 mEq by mouth 3 (three) times daily with meals.      . predniSONE (DELTASONE) 10 MG tablet Take 10 mg by mouth daily.      Marland Kitchen telmisartan-hydrochlorothiazide (MICARDIS HCT) 80-12.5 MG per tablet Take 1 tablet by mouth daily.      Marland Kitchen tiZANidine (ZANAFLEX) 4  MG capsule Take 2 mg by mouth 3 (three) times daily. Muscle spasms      . traMADol (ULTRAM) 50 MG tablet Take 50 mg by mouth every 6 (six) hours as needed for pain.      . metoprolol (LOPRESSOR) 50 MG tablet Take 1/2 tablet twice a day  180 tablet  3   No current facility-administered medications for this visit.    Allergies  Allergen Reactions  . Hydrocodone-Acetaminophen Other (See Comments)    Confusion  . Ramipril     Cough    Patient Active Problem List   Diagnosis Date Noted  . Unspecified arthropathy, lower leg 10/19/2012  . Secondary renovascular hypertension, benign 10/19/2012  . Acute blood loss anemia 10/05/2012  . Unspecified constipation 10/05/2012  . GERD (gastroesophageal reflux disease) 10/05/2012  . Syncope and collapse 09/18/2012  . Hypotension 09/18/2012  . Confusion 09/18/2012  . CKD (chronic kidney disease), stage III 09/18/2012  . Dehydration 09/18/2012  . Expected blood loss anemia 09/15/2012  . Overweight (BMI 25.0-29.9) 09/15/2012  . S/P bilateral TKA 09/13/2012  . Benign hypertensive heart disease without heart failure 07/15/2012  . PSVT (paroxysmal supraventricular tachycardia) 07/15/2012  . Osteoarthritis 06/24/2011  . Dizziness 01/07/2011  . HTN (hypertension) 01/07/2011  . SVT (supraventricular tachycardia) 01/07/2011    History  Smoking status  . Never Smoker   Smokeless tobacco  . Not on file    History  Alcohol Use  . Yes    Comment: Drink Wine almost daily    Family History  Problem Relation Age of Onset  . Other Neg Hx     Review of Systems: The patient denies any heat or cold intolerance.  No weight gain or weight loss.  The patient denies headaches or blurry vision.  There is no cough or sputum production.  The patient denies dizziness.  There is no hematuria or hematochezia.  The patient denies any muscle aches or arthritis.  The patient denies any rash.  The patient denies frequent falling or instability.  There is no  history of depression or anxiety.  All other systems were reviewed and are negative.   Physical Exam: Filed Vitals:   11/05/12 1128  BP: 132/74  Pulse: 60   The general appearance reveals a well-developed well-nourished gentleman in no distress.The head and neck exam reveals pupils equal and reactive.  Extraocular movements are full.  There is no scleral icterus.  The mouth and pharynx are normal.  The neck is supple.  The carotids reveal no bruits.  The jugular venous pressure is normal.  The  thyroid is not enlarged.  There is no lymphadenopathy.  The chest is clear to percussion and auscultation.  There are no rales or rhonchi.  Expansion of the chest is symmetrical.  The precordium is quiet.  The first heart sound is normal.  The second heart sound is physiologically split.  There is no murmur gallop rub or click.  There is no abnormal lift or heave.  The abdomen is soft and nontender.  The bowel sounds are normal.  The liver and spleen are not enlarged.  There are no abdominal masses.  There are no abdominal bruits.  Extremities reveal good pedal pulses.  There is no phlebitis or edema.  There is no cyanosis or clubbing.  Strength is normal and symmetrical in all extremities.  There is no lateralizing weakness.  There are no sensory deficits.  The skin is warm and dry.  There is no rash.     Assessment / Plan: Continue same medication.  Rather than take all of his Lopressor once he will split his dose and take a half tablet twice a day  Recheck in 4 months for office visit and EKG

## 2012-11-16 DIAGNOSIS — M25519 Pain in unspecified shoulder: Secondary | ICD-10-CM | POA: Diagnosis not present

## 2012-11-29 ENCOUNTER — Other Ambulatory Visit: Payer: Self-pay | Admitting: Cardiology

## 2012-11-30 DIAGNOSIS — M25519 Pain in unspecified shoulder: Secondary | ICD-10-CM | POA: Diagnosis not present

## 2012-12-02 DIAGNOSIS — H01029 Squamous blepharitis unspecified eye, unspecified eyelid: Secondary | ICD-10-CM | POA: Diagnosis not present

## 2012-12-03 DIAGNOSIS — M25519 Pain in unspecified shoulder: Secondary | ICD-10-CM | POA: Diagnosis not present

## 2012-12-06 ENCOUNTER — Telehealth: Payer: Self-pay | Admitting: Cardiology

## 2012-12-06 NOTE — Telephone Encounter (Signed)
New prob  Pt states he is scheduled to have shoulder surgery in August, he wants to make sure its ok

## 2012-12-06 NOTE — Telephone Encounter (Signed)
Spoke with patient who would like to know if he is cleared to have shoulder surgery in August.  Patient states he would like to speak with Dr. Patty Sermons about his heart rate.  Patient states that his normal heart rate is usually in the 60's but that recently it has been in the 80s and 90s and that he is feeling faint at times.  Patient states he has Metoprolol succ to take for times when his heart rate is extremely elevated but he has not felt that it has been that high so he has not taken the medication.  Patient reports he is taking all of his regular medications as directed.  Patient would like Dr. Patty Sermons to call him.

## 2012-12-07 NOTE — Telephone Encounter (Signed)
Called patient and he was away from his calendar will call back tomorrow  Needs ov/ekg/cbc/bmet/tsh soon per  Dr. Patty Sermons

## 2012-12-08 NOTE — Telephone Encounter (Signed)
Scheduled ov 

## 2012-12-08 NOTE — Telephone Encounter (Signed)
Advised patient

## 2012-12-10 DIAGNOSIS — M1710 Unilateral primary osteoarthritis, unspecified knee: Secondary | ICD-10-CM | POA: Diagnosis not present

## 2012-12-14 ENCOUNTER — Encounter: Payer: Self-pay | Admitting: Cardiology

## 2012-12-14 ENCOUNTER — Other Ambulatory Visit: Payer: Medicare Other

## 2012-12-14 ENCOUNTER — Ambulatory Visit (INDEPENDENT_AMBULATORY_CARE_PROVIDER_SITE_OTHER): Payer: Medicare Other | Admitting: Cardiology

## 2012-12-14 VITALS — BP 122/74 | HR 67 | Ht 72.0 in | Wt 219.0 lb

## 2012-12-14 DIAGNOSIS — Z01810 Encounter for preprocedural cardiovascular examination: Secondary | ICD-10-CM | POA: Diagnosis not present

## 2012-12-14 DIAGNOSIS — I498 Other specified cardiac arrhythmias: Secondary | ICD-10-CM | POA: Diagnosis not present

## 2012-12-14 DIAGNOSIS — I119 Hypertensive heart disease without heart failure: Secondary | ICD-10-CM

## 2012-12-14 DIAGNOSIS — I471 Supraventricular tachycardia: Secondary | ICD-10-CM

## 2012-12-14 DIAGNOSIS — R0609 Other forms of dyspnea: Secondary | ICD-10-CM

## 2012-12-14 LAB — BASIC METABOLIC PANEL
BUN: 17 mg/dL (ref 6–23)
Chloride: 105 mEq/L (ref 96–112)
Glucose, Bld: 105 mg/dL — ABNORMAL HIGH (ref 70–99)
Potassium: 4.8 mEq/L (ref 3.5–5.1)

## 2012-12-14 LAB — CBC WITH DIFFERENTIAL/PLATELET
Basophils Absolute: 0 10*3/uL (ref 0.0–0.1)
Eosinophils Absolute: 0.2 10*3/uL (ref 0.0–0.7)
HCT: 44.6 % (ref 39.0–52.0)
Lymphs Abs: 1.9 10*3/uL (ref 0.7–4.0)
MCHC: 33.2 g/dL (ref 30.0–36.0)
MCV: 90.8 fl (ref 78.0–100.0)
Monocytes Absolute: 1 10*3/uL (ref 0.1–1.0)
Platelets: 197 10*3/uL (ref 150.0–400.0)
RDW: 15.1 % — ABNORMAL HIGH (ref 11.5–14.6)

## 2012-12-14 LAB — TSH: TSH: 1.5 u[IU]/mL (ref 0.35–5.50)

## 2012-12-14 NOTE — Assessment & Plan Note (Signed)
Blood pressure has been remaining stable on current medication. 

## 2012-12-14 NOTE — Assessment & Plan Note (Signed)
Patient has noted some dyspnea on exertion.

## 2012-12-14 NOTE — Assessment & Plan Note (Signed)
Patient has a history of remote supraventricular tachycardia.  No recent episodes and he remains on beta blocker

## 2012-12-14 NOTE — Progress Notes (Signed)
[name] [name] Date of Birth:  06/10/1935 [facility] [address] [place], [place]  [address] [phone]         Fax   [phone]  History of Present Illness: This pleasant 77 year old gentleman is seen for a presurgical office visit. He has a past history of essential hypertension and a past history of paroxysmal supraventricular tachycardia. He does not have any history of ischemic heart disease. He had a cardiac catheterization in 2001 showing normal coronary arteries he had a normal nuclear stress test in 2008. He had an echocardiogram in 2011 which showed mild LVH with normal systolic function and with impaired relaxation. He has mild aortic sclerosis by echo as well as trace mitral regurgitation. Since we last saw him he had double knee replacement by Dr. Charlann Boxer. He has done well postoperatively. He went initially to a nursing home for rehabilitation. He was readmitted briefly to the hospital because of severe dizziness and syncope related to pain medication.  He is scheduled for shoulder surgery on 12/29/12 by Dr. Ranell Patrick.  He has overall been doing well but yesterday had a weak spell after having a bowel movement.  He did not have syncope.  He felt that his pulse was a little fast.  Today using his exercise bicycle he noted a little more short of breath or little more fatigue and was more diaphoretic than usual.   Current Outpatient Prescriptions  Medication Sig Dispense Refill  . aspirin 81 MG tablet Take 81 mg by mouth daily.      . Cholecalciferol (VITAMIN D PO) Take 1,000 mg by mouth daily.       Marland Kitchen docusate sodium 100 MG CAPS Take 100 mg by mouth 2 (two) times daily.  10 capsule    . Lansoprazole (PREVACID PO) Take by mouth as needed.      . metoprolol (LOPRESSOR) 50 MG tablet Take 50 mg by mouth daily. Take 1/2 tablet twice a day  180 tablet  3  . MICARDIS HCT 80-12.5 MG per tablet TAKE ONE TABLET EVERY DAY  90 tablet  1  . potassium citrate (UROCIT-K) 10 MEQ  (1080 MG) SR tablet Take 10 mEq by mouth 3 (three) times daily with meals.       No current facility-administered medications for this visit.    Allergies  Allergen Reactions  . Hydrocodone-Acetaminophen Other (See Comments)    Confusion  . Ramipril     Cough    Patient Active Problem List   Diagnosis Date Noted  . Unspecified arthropathy, lower leg 10/19/2012  . Secondary renovascular hypertension, benign 10/19/2012  . Acute blood loss anemia 10/05/2012  . Unspecified constipation 10/05/2012  . GERD (gastroesophageal reflux disease) 10/05/2012  . Syncope and collapse 09/18/2012  . Hypotension 09/18/2012  . Confusion 09/18/2012  . CKD (chronic kidney disease), stage III 09/18/2012  . Dehydration 09/18/2012  . Expected blood loss anemia 09/15/2012  . Overweight (BMI 25.0-29.9) 09/15/2012  . S/P bilateral TKA 09/13/2012  . Benign hypertensive heart disease without heart failure 07/15/2012  . PSVT (paroxysmal supraventricular tachycardia) 07/15/2012  . Osteoarthritis 06/24/2011  . Dizziness 01/07/2011  . HTN (hypertension) 01/07/2011  . SVT (supraventricular tachycardia) 01/07/2011    History  Smoking status  . Never Smoker   Smokeless tobacco  . Not on file    History  Alcohol Use  . Yes    Comment: Drink Wine almost daily    Family History  Problem Relation Age of Onset  . Other  Neg Hx     Review of Systems: Constitutional: no fever chills diaphoresis or fatigue or change in weight.  Head and neck: no hearing loss, no epistaxis, no photophobia or visual disturbance. Respiratory: No cough, shortness of breath or wheezing. Cardiovascular: No chest pain peripheral edema, palpitations. Gastrointestinal: No abdominal distention, no abdominal pain, no change in bowel habits hematochezia or melena. Genitourinary: No dysuria, no frequency, no urgency, no nocturia. Musculoskeletal:No arthralgias, no back pain, no gait disturbance or myalgias. Neurological: No  dizziness, no headaches, no numbness, no seizures, no syncope, no weakness, no tremors. Hematologic: No lymphadenopathy, no easy bruising. Psychiatric: No confusion, no hallucinations, no sleep disturbance.    Physical Exam: Filed Vitals:   12/14/12 0900  BP: 122/74  Pulse: 67   the general appearance reveals a well-developed well-nourished gentleman in no distress.The head and neck exam reveals pupils equal and reactive.  Extraocular movements are full.  There is no scleral icterus.  The mouth and pharynx are normal.  The neck is supple.  The carotids reveal no bruits.  The jugular venous pressure is normal.  The  thyroid is not enlarged.  There is no lymphadenopathy.  The chest is clear to percussion and auscultation.  There are no rales or rhonchi.  Expansion of the chest is symmetrical.  The precordium is quiet.  The first heart sound is normal.  The second heart sound is physiologically split.  There is no murmur gallop rub or click.  There is no abnormal lift or heave.  The abdomen is soft and nontender.  The bowel sounds are normal.  The liver and spleen are not enlarged.  There are no abdominal masses.  There are no abdominal bruits.  Extremities reveal good pedal pulses.  There is no phlebitis or edema.  There is no cyanosis or clubbing.  Strength is normal and symmetrical in all extremities.  There is no lateralizing weakness.  There are no sensory deficits.  The skin is warm and dry.  There is no rash.  EKG today shows normal sinus rhythm with ST and T-wave abnormalities consider anterior ischemia.  However the tracing is unchanged from 09/18/12   Assessment / Plan: We will have the patient return for a walking Lexa scan Myoview stress test soon

## 2012-12-14 NOTE — Progress Notes (Signed)
Quick Note:  Please report to patient. The recent labs are stable. Continue same medication and careful diet. ______ 

## 2012-12-14 NOTE — Patient Instructions (Addendum)
Your physician recommends that you continue on your current medications as directed. Please refer to the Current Medication list given to you today.  Your physician has requested that you have a lexiscan myoview. For further information please visit [email or web address] Please follow instruction sheet, as given.  Your physician wants you to follow-up in: 3 month ovYou will receive a reminder letter in the mail two months in advance. If you don't receive a letter, please call our office to schedule the follow-up appointment.

## 2012-12-15 ENCOUNTER — Telehealth: Payer: Self-pay | Admitting: *Deleted

## 2012-12-15 NOTE — Telephone Encounter (Signed)
Message copied by Burnell Blanks on Wed Dec 15, 2012  8:51 AM ------      Message from: Cassell Clement      Created: Tue Dec 14, 2012  5:32 PM       Please report to patient.  The recent labs are stable. Continue same medication and careful diet. ------

## 2012-12-15 NOTE — Telephone Encounter (Signed)
Advised patient of lab results  

## 2012-12-16 ENCOUNTER — Ambulatory Visit (HOSPITAL_COMMUNITY): Payer: Medicare Other | Attending: Cardiology | Admitting: Radiology

## 2012-12-16 VITALS — BP 124/76 | HR 53 | Ht 72.0 in | Wt 219.0 lb

## 2012-12-16 DIAGNOSIS — R0989 Other specified symptoms and signs involving the circulatory and respiratory systems: Secondary | ICD-10-CM | POA: Insufficient documentation

## 2012-12-16 DIAGNOSIS — R55 Syncope and collapse: Secondary | ICD-10-CM | POA: Diagnosis not present

## 2012-12-16 DIAGNOSIS — R42 Dizziness and giddiness: Secondary | ICD-10-CM | POA: Diagnosis not present

## 2012-12-16 DIAGNOSIS — R0609 Other forms of dyspnea: Secondary | ICD-10-CM | POA: Insufficient documentation

## 2012-12-16 DIAGNOSIS — R61 Generalized hyperhidrosis: Secondary | ICD-10-CM | POA: Diagnosis not present

## 2012-12-16 DIAGNOSIS — R0602 Shortness of breath: Secondary | ICD-10-CM

## 2012-12-16 DIAGNOSIS — R002 Palpitations: Secondary | ICD-10-CM | POA: Diagnosis not present

## 2012-12-16 DIAGNOSIS — Z01810 Encounter for preprocedural cardiovascular examination: Secondary | ICD-10-CM

## 2012-12-16 DIAGNOSIS — I10 Essential (primary) hypertension: Secondary | ICD-10-CM | POA: Insufficient documentation

## 2012-12-16 DIAGNOSIS — R000 Tachycardia, unspecified: Secondary | ICD-10-CM | POA: Insufficient documentation

## 2012-12-16 MED ORDER — TECHNETIUM TC 99M SESTAMIBI GENERIC - CARDIOLITE
11.0000 | Freq: Once | INTRAVENOUS | Status: AC | PRN
  Administered 2012-12-16: 11 via INTRAVENOUS

## 2012-12-16 MED ORDER — TECHNETIUM TC 99M SESTAMIBI GENERIC - CARDIOLITE
33.0000 | Freq: Once | INTRAVENOUS | Status: AC | PRN
  Administered 2012-12-16: 33 via INTRAVENOUS

## 2012-12-16 MED ORDER — REGADENOSON 0.4 MG/5ML IV SOLN
0.4000 mg | Freq: Once | INTRAVENOUS | Status: AC
  Administered 2012-12-16: 0.4 mg via INTRAVENOUS

## 2012-12-16 NOTE — Progress Notes (Signed)
[facility] SITE 3 NUCLEAR MED [address] [place], [place] [address] [phone]    Cardiology Nuclear Med Study  [name] [name] is a 77 y.o. male     MRN : [id number]     DOB: 07/29/1935  Procedure Date: 12/16/2012  Nuclear Med Background Indication for Stress Test:  Evaluation for Ischemia and Pending Clearance for (R) Shoulder Surgery on  12/29/12 by Dr. Malon Kindle History:  '01 Cath:normal coronaries; '08 WUX:LKGMWN; '11 Echo:EF=60% Cardiac Risk Factors: Hypertension  Symptoms:  Diaphoresis, Dizziness, DOE, Near Syncope, Palpitations and Rapid HR   Nuclear Pre-Procedure Caffeine/Decaff Intake:  None > 12 hrs NPO After: 7:00pm   Lungs:  clear O2 Sat: 98% on room air. IV 0.9% NS with Angio Cath:  22g  IV Site: R Hand x 1, tolerated well IV Started by:  Irean Hong, RN  Chest Size (in):  46 Cup Size: n/a  Height: 6' (1.829 m)  Weight:  219 lb (99.338 kg)  BMI:  Body mass index is 29.7 kg/(m^2). Tech Comments:  Metoprolol 8:00 am today    Nuclear Med Study 1 or 2 day study: 1 day  Stress Test Type:  Treadmill/Lexiscan  Reading MD: Cassell Clement, MD  Order Authorizing Provider:  Cassell Clement, MD  Resting Radionuclide: Technetium 7m Sestamibi  Resting Radionuclide Dose: 10.9 mCi   Stress Radionuclide:  Technetium 81m Sestamibi  Stress Radionuclide Dose: 33.0 mCi           Stress Protocol Rest HR: 53 Stress HR: 87  Rest BP: 124/76 Stress BP: 146/78  Exercise Time (min): 2:00 METS: n/a   Predicted Max HR: 145 bpm % Max HR: 60 bpm Rate Pressure Product: 02725   Dose of Adenosine (mg):  n/a Dose of Lexiscan: n/a mg  Dose of Atropine (mg): n/a Dose of Dobutamine: n/a mcg/kg/min (at max HR)  Stress Test Technologist: Smiley Houseman, CMA-N  Nuclear Technologist:  Doyne Keel, CNMT     Rest Procedure:  Myocardial perfusion imaging was performed at rest 45 minutes following the intravenous administration of Technetium 89m Sestamibi.  Rest ECG: NSR  with non-specific ST-T wave changes  Stress Procedure:  The patient received IV Lexiscan 0.4 mg over 15-seconds with concurrent low level exercise and then Technetium 51m Sestamibi was injected at 30-seconds while the patient continued walking one more minute.  Patient c/o lightheadedness, headache and stomach cramps.   Quantitative spect images were obtained after a 45-minute delay.  Stress ECG: No significant change from baseline ECG  QPS Raw Data Images:  Normal; no motion artifact; normal heart/lung ratio. Stress Images:  Normal homogeneous uptake in all areas of the myocardium. Rest Images:  Normal homogeneous uptake in all areas of the myocardium. Subtraction (SDS):  No evidence of ischemia. Transient Ischemic Dilatation (Normal <1.22):  n/a Lung/Heart Ratio (Normal <0.45):  0.45  Quantitative Gated Spect Images QGS EDV:  99 ml QGS ESV:  41 ml  Impression Exercise Capacity:  Lexiscan with low level exercise. BP Response:  Normal blood pressure response. Clinical Symptoms:  No chest pain. ECG Impression:  No significant ST segment change suggestive of ischemia. Comparison with Prior Nuclear Study: No images to compare  Overall Impression:  Normal stress nuclear study.  LV Ejection Fraction: 59%.  LV Wall Motion:  NL LV Function; NL Wall Motion   [employer]

## 2012-12-27 ENCOUNTER — Telehealth: Payer: Self-pay | Admitting: Cardiology

## 2012-12-27 NOTE — Telephone Encounter (Signed)
Received documentation from [facility]: Patient is cleared for surgery with the following recommendations of continue current medications-signed T. Brackbill

## 2012-12-29 DIAGNOSIS — M7130 Other bursal cyst, unspecified site: Secondary | ICD-10-CM | POA: Diagnosis not present

## 2012-12-29 DIAGNOSIS — M6749 Ganglion, multiple sites: Secondary | ICD-10-CM | POA: Diagnosis not present

## 2012-12-29 DIAGNOSIS — M75110 Incomplete rotator cuff tear or rupture of unspecified shoulder, not specified as traumatic: Secondary | ICD-10-CM | POA: Diagnosis not present

## 2012-12-29 DIAGNOSIS — M19019 Primary osteoarthritis, unspecified shoulder: Secondary | ICD-10-CM | POA: Diagnosis not present

## 2012-12-29 DIAGNOSIS — M66329 Spontaneous rupture of flexor tendons, unspecified upper arm: Secondary | ICD-10-CM | POA: Diagnosis not present

## 2012-12-29 DIAGNOSIS — S43429A Sprain of unspecified rotator cuff capsule, initial encounter: Secondary | ICD-10-CM | POA: Diagnosis not present

## 2012-12-29 DIAGNOSIS — M24019 Loose body in unspecified shoulder: Secondary | ICD-10-CM | POA: Diagnosis not present

## 2012-12-29 DIAGNOSIS — M24119 Other articular cartilage disorders, unspecified shoulder: Secondary | ICD-10-CM | POA: Diagnosis not present

## 2012-12-29 DIAGNOSIS — G8918 Other acute postprocedural pain: Secondary | ICD-10-CM | POA: Diagnosis not present

## 2013-01-03 DIAGNOSIS — S43429A Sprain of unspecified rotator cuff capsule, initial encounter: Secondary | ICD-10-CM | POA: Diagnosis not present

## 2013-01-06 DIAGNOSIS — S43429A Sprain of unspecified rotator cuff capsule, initial encounter: Secondary | ICD-10-CM | POA: Diagnosis not present

## 2013-01-11 DIAGNOSIS — S43429A Sprain of unspecified rotator cuff capsule, initial encounter: Secondary | ICD-10-CM | POA: Diagnosis not present

## 2013-01-14 DIAGNOSIS — S43429A Sprain of unspecified rotator cuff capsule, initial encounter: Secondary | ICD-10-CM | POA: Diagnosis not present

## 2013-01-18 DIAGNOSIS — S43429A Sprain of unspecified rotator cuff capsule, initial encounter: Secondary | ICD-10-CM | POA: Diagnosis not present

## 2013-01-19 DIAGNOSIS — S43429A Sprain of unspecified rotator cuff capsule, initial encounter: Secondary | ICD-10-CM | POA: Diagnosis not present

## 2013-01-24 DIAGNOSIS — S43429A Sprain of unspecified rotator cuff capsule, initial encounter: Secondary | ICD-10-CM | POA: Diagnosis not present

## 2013-01-27 DIAGNOSIS — S43429A Sprain of unspecified rotator cuff capsule, initial encounter: Secondary | ICD-10-CM | POA: Diagnosis not present

## 2013-02-01 DIAGNOSIS — S43429A Sprain of unspecified rotator cuff capsule, initial encounter: Secondary | ICD-10-CM | POA: Diagnosis not present

## 2013-02-03 DIAGNOSIS — S43429A Sprain of unspecified rotator cuff capsule, initial encounter: Secondary | ICD-10-CM | POA: Diagnosis not present

## 2013-02-07 DIAGNOSIS — S43429A Sprain of unspecified rotator cuff capsule, initial encounter: Secondary | ICD-10-CM | POA: Diagnosis not present

## 2013-02-10 DIAGNOSIS — S43429A Sprain of unspecified rotator cuff capsule, initial encounter: Secondary | ICD-10-CM | POA: Diagnosis not present

## 2013-02-16 DIAGNOSIS — S43429A Sprain of unspecified rotator cuff capsule, initial encounter: Secondary | ICD-10-CM | POA: Diagnosis not present

## 2013-02-22 DIAGNOSIS — S43429A Sprain of unspecified rotator cuff capsule, initial encounter: Secondary | ICD-10-CM | POA: Diagnosis not present

## 2013-02-23 DIAGNOSIS — Z23 Encounter for immunization: Secondary | ICD-10-CM | POA: Diagnosis not present

## 2013-03-01 DIAGNOSIS — S43429A Sprain of unspecified rotator cuff capsule, initial encounter: Secondary | ICD-10-CM | POA: Diagnosis not present

## 2013-03-07 ENCOUNTER — Ambulatory Visit: Payer: Medicare Other | Admitting: Cardiology

## 2013-03-08 DIAGNOSIS — S43429A Sprain of unspecified rotator cuff capsule, initial encounter: Secondary | ICD-10-CM | POA: Diagnosis not present

## 2013-03-09 ENCOUNTER — Encounter: Payer: Self-pay | Admitting: Cardiology

## 2013-03-09 ENCOUNTER — Ambulatory Visit (INDEPENDENT_AMBULATORY_CARE_PROVIDER_SITE_OTHER): Payer: Medicare Other | Admitting: Cardiology

## 2013-03-09 VITALS — BP 128/74 | HR 69 | Ht 72.0 in | Wt 218.0 lb

## 2013-03-09 DIAGNOSIS — I10 Essential (primary) hypertension: Secondary | ICD-10-CM | POA: Diagnosis not present

## 2013-03-09 DIAGNOSIS — I498 Other specified cardiac arrhythmias: Secondary | ICD-10-CM

## 2013-03-09 DIAGNOSIS — R0609 Other forms of dyspnea: Secondary | ICD-10-CM

## 2013-03-09 DIAGNOSIS — I471 Supraventricular tachycardia: Secondary | ICD-10-CM

## 2013-03-09 NOTE — Assessment & Plan Note (Signed)
Blood pressure was remaining stable on current therapy.  He has had no further episodes of dizziness or syncope.  He has not been having a chest pain.

## 2013-03-09 NOTE — Assessment & Plan Note (Signed)
Exercise tolerance is good.  He is not having any significant exertional dyspnea.  His weight is down 1 pound since last visit.

## 2013-03-09 NOTE — Progress Notes (Signed)
[name] [name] Date of Birth:  June 12, 1935 [address] [facility] [address] [place], [place]  [address] [phone]         Fax   [phone]  History of Present Illness: This pleasant 77 year old gentleman is seen for a scheduled followup office visit.  Since we last saw him he has had successful rotator cuff surgery on his right shoulder by Dr. Beverely Low.  He is now doing rehabilitation on his own. He has a past history of essential hypertension and a past history of paroxysmal supraventricular tachycardia. He does not have any history of ischemic heart disease. He had a cardiac catheterization in 2001 showing normal coronary arteries he had a normal nuclear stress test in 2008. He had an echocardiogram in 2011 which showed mild LVH with normal systolic function and with impaired relaxation. He has mild aortic sclerosis by echo as well as trace mitral regurgitation. Since we last saw him he had double knee replacement by Dr. Charlann Boxer. He has done well postoperatively. He went initially to a nursing home for rehabilitation. He was readmitted briefly to the hospital because of severe dizziness and syncope related to pain medication.   His most recent nuclear stress test was 12/16/12 showing an ejection fraction of 59% and no ischemia or wall motion abnormalities.  He does have a chronically abnormal electrocardiogram.  Current Outpatient Prescriptions  Medication Sig Dispense Refill  . aspirin 81 MG tablet Take 81 mg by mouth daily.      . Cholecalciferol (VITAMIN D PO) Take 1,000 mg by mouth daily.       Tery Sanfilippo Sodium (DSS) 100 MG CAPS Take 100 mg by mouth daily.      . Lansoprazole (PREVACID PO) Take by mouth as needed.      . metoprolol (LOPRESSOR) 50 MG tablet Take 50 mg by mouth daily.   180 tablet  3  . MICARDIS HCT 80-12.5 MG per tablet TAKE ONE TABLET EVERY DAY  90 tablet  1  . potassium citrate (UROCIT-K) 10 MEQ (1080 MG) SR tablet Take 10 mEq by mouth 3 (three) times daily with meals.        No current facility-administered medications for this visit.    Allergies  Allergen Reactions  . Hydrocodone-Acetaminophen Other (See Comments)    Confusion  . Ramipril     Cough    Patient Active Problem List   Diagnosis Date Noted  . Dyspnea on exertion 12/14/2012  . Unspecified arthropathy, lower leg 10/19/2012  . Secondary renovascular hypertension, benign 10/19/2012  . Acute blood loss anemia 10/05/2012  . Unspecified constipation 10/05/2012  . GERD (gastroesophageal reflux disease) 10/05/2012  . Syncope and collapse 09/18/2012  . Hypotension 09/18/2012  . Confusion 09/18/2012  . CKD (chronic kidney disease), stage III 09/18/2012  . Dehydration 09/18/2012  . Expected blood loss anemia 09/15/2012  . Overweight (BMI 25.0-29.9) 09/15/2012  . S/P bilateral TKA 09/13/2012  . Benign hypertensive heart disease without heart failure 07/15/2012  . PSVT (paroxysmal supraventricular tachycardia) 07/15/2012  . Osteoarthritis 06/24/2011  . Dizziness 01/07/2011  . HTN (hypertension) 01/07/2011  . SVT (supraventricular tachycardia) 01/07/2011    History  Smoking status  . Never Smoker   Smokeless tobacco  . Not on file    History  Alcohol Use  . Yes    Comment: Drink Wine almost daily    Family History  Problem Relation Age of Onset  . Other Neg Hx     Review of Systems: Constitutional: no fever chills  diaphoresis or fatigue or change in weight.  Head and neck: no hearing loss, no epistaxis, no photophobia or visual disturbance. Respiratory: No cough, shortness of breath or wheezing. Cardiovascular: No chest pain peripheral edema, palpitations. Gastrointestinal: No abdominal distention, no abdominal pain, no change in bowel habits hematochezia or melena. Genitourinary: No dysuria, no frequency, no urgency, no nocturia. Musculoskeletal:No arthralgias, no back pain, no gait disturbance or myalgias. Neurological: No dizziness, no headaches, no numbness, no  seizures, no syncope, no weakness, no tremors. Hematologic: No lymphadenopathy, no easy bruising. Psychiatric: No confusion, no hallucinations, no sleep disturbance.    Physical Exam: Filed Vitals:   03/09/13 1038  BP: 128/74  Pulse: 69   the general appearance reveals a well-developed well-nourished gentleman in no distress.The head and neck exam reveals pupils equal and reactive.  Extraocular movements are full.  There is no scleral icterus.  The mouth and pharynx are normal.  The neck is supple.  The carotids reveal no bruits.  The jugular venous pressure is normal.  The  thyroid is not enlarged.  There is no lymphadenopathy.  The chest is clear to percussion and auscultation.  There are no rales or rhonchi.  Expansion of the chest is symmetrical.  The precordium is quiet.  The first heart sound is normal.  The second heart sound is physiologically split.  There is no murmur gallop rub or click.  There is no abnormal lift or heave.  The abdomen is soft and nontender.  The bowel sounds are normal.  The liver and spleen are not enlarged.  There are no abdominal masses.  There are no abdominal bruits.  Extremities reveal good pedal pulses.  There is no phlebitis or edema.  There is no cyanosis or clubbing.  Strength is normal and symmetrical in all extremities.  There is no lateralizing weakness.  There are no sensory deficits.  The skin is warm and dry.  There is no rash.     Assessment / Plan: The patient is doing well on current therapy.  No change in medication today.  Recheck in 6 months for office visit and EKG.

## 2013-03-09 NOTE — Patient Instructions (Signed)
Your physician recommends that you continue on your current medications as directed. Please refer to the Current Medication list given to you today.  Your physician wants you to follow-up in: 6 MONTHS OV/EKG You will receive a reminder letter in the mail two months in advance. If you don't receive a letter, please call our office to schedule the follow-up appointment.

## 2013-03-09 NOTE — Assessment & Plan Note (Signed)
He has not had any recurrent SVT

## 2013-03-15 DIAGNOSIS — S43429A Sprain of unspecified rotator cuff capsule, initial encounter: Secondary | ICD-10-CM | POA: Diagnosis not present

## 2013-03-23 ENCOUNTER — Other Ambulatory Visit: Payer: Self-pay | Admitting: Dermatology

## 2013-03-23 DIAGNOSIS — L570 Actinic keratosis: Secondary | ICD-10-CM | POA: Diagnosis not present

## 2013-03-23 DIAGNOSIS — Z85828 Personal history of other malignant neoplasm of skin: Secondary | ICD-10-CM | POA: Diagnosis not present

## 2013-03-23 DIAGNOSIS — C44319 Basal cell carcinoma of skin of other parts of face: Secondary | ICD-10-CM | POA: Diagnosis not present

## 2013-03-23 DIAGNOSIS — C44519 Basal cell carcinoma of skin of other part of trunk: Secondary | ICD-10-CM | POA: Diagnosis not present

## 2013-03-23 DIAGNOSIS — L259 Unspecified contact dermatitis, unspecified cause: Secondary | ICD-10-CM | POA: Diagnosis not present

## 2013-03-23 DIAGNOSIS — D485 Neoplasm of uncertain behavior of skin: Secondary | ICD-10-CM | POA: Diagnosis not present

## 2013-03-23 DIAGNOSIS — L820 Inflamed seborrheic keratosis: Secondary | ICD-10-CM | POA: Diagnosis not present

## 2013-03-23 DIAGNOSIS — L719 Rosacea, unspecified: Secondary | ICD-10-CM | POA: Diagnosis not present

## 2013-03-28 DIAGNOSIS — Z85828 Personal history of other malignant neoplasm of skin: Secondary | ICD-10-CM | POA: Diagnosis not present

## 2013-03-28 DIAGNOSIS — C44319 Basal cell carcinoma of skin of other parts of face: Secondary | ICD-10-CM | POA: Diagnosis not present

## 2013-04-21 DIAGNOSIS — S43429A Sprain of unspecified rotator cuff capsule, initial encounter: Secondary | ICD-10-CM | POA: Diagnosis not present

## 2013-05-02 ENCOUNTER — Other Ambulatory Visit: Payer: Self-pay | Admitting: Cardiology

## 2013-05-06 DIAGNOSIS — M25569 Pain in unspecified knee: Secondary | ICD-10-CM | POA: Diagnosis not present

## 2013-05-24 DIAGNOSIS — M25569 Pain in unspecified knee: Secondary | ICD-10-CM | POA: Diagnosis not present

## 2013-05-30 ENCOUNTER — Other Ambulatory Visit: Payer: Self-pay | Admitting: Cardiology

## 2013-06-02 DIAGNOSIS — M25569 Pain in unspecified knee: Secondary | ICD-10-CM | POA: Diagnosis not present

## 2013-06-09 DIAGNOSIS — M25569 Pain in unspecified knee: Secondary | ICD-10-CM | POA: Diagnosis not present

## 2013-06-10 DIAGNOSIS — R1013 Epigastric pain: Secondary | ICD-10-CM | POA: Diagnosis not present

## 2013-06-15 DIAGNOSIS — H01029 Squamous blepharitis unspecified eye, unspecified eyelid: Secondary | ICD-10-CM | POA: Diagnosis not present

## 2013-06-15 DIAGNOSIS — H43819 Vitreous degeneration, unspecified eye: Secondary | ICD-10-CM | POA: Diagnosis not present

## 2013-06-15 DIAGNOSIS — Z961 Presence of intraocular lens: Secondary | ICD-10-CM | POA: Diagnosis not present

## 2013-06-16 DIAGNOSIS — M25519 Pain in unspecified shoulder: Secondary | ICD-10-CM | POA: Diagnosis not present

## 2013-06-16 DIAGNOSIS — Z4789 Encounter for other orthopedic aftercare: Secondary | ICD-10-CM | POA: Diagnosis not present

## 2013-06-17 DIAGNOSIS — M25569 Pain in unspecified knee: Secondary | ICD-10-CM | POA: Diagnosis not present

## 2013-06-17 DIAGNOSIS — Z96659 Presence of unspecified artificial knee joint: Secondary | ICD-10-CM | POA: Diagnosis not present

## 2013-06-22 DIAGNOSIS — R1013 Epigastric pain: Secondary | ICD-10-CM | POA: Diagnosis not present

## 2013-06-30 DIAGNOSIS — M25569 Pain in unspecified knee: Secondary | ICD-10-CM | POA: Diagnosis not present

## 2013-07-04 ENCOUNTER — Other Ambulatory Visit: Payer: Self-pay | Admitting: Orthopedic Surgery

## 2013-07-04 DIAGNOSIS — M1610 Unilateral primary osteoarthritis, unspecified hip: Secondary | ICD-10-CM

## 2013-07-07 ENCOUNTER — Ambulatory Visit
Admission: RE | Admit: 2013-07-07 | Discharge: 2013-07-07 | Disposition: A | Discharge status: Home / Self Care | Payer: Medicare Other | Source: Ambulatory Visit | Attending: Orthopedic Surgery | Admitting: Orthopedic Surgery

## 2013-07-07 DIAGNOSIS — M1610 Unilateral primary osteoarthritis, unspecified hip: Secondary | ICD-10-CM | POA: Diagnosis not present

## 2013-07-07 DIAGNOSIS — Z4789 Encounter for other orthopedic aftercare: Secondary | ICD-10-CM | POA: Diagnosis not present

## 2013-07-07 DIAGNOSIS — S43429A Sprain of unspecified rotator cuff capsule, initial encounter: Secondary | ICD-10-CM | POA: Diagnosis not present

## 2013-07-07 IMAGING — RF DG FLUORO GUIDE NDL PLC/BX
1 series · 1 of 1 positions shown · non-contrast
Comparison: none

CLINICAL DATA: Arthritis, pain

EXAM:
RIGHT HIP INJECTION UNDER FLUOROSCOPY
TECHNIQUE: The procedure, risks (including but not limited to bleeding,
infection, organ damage ), benefits, and alternatives were explained
to the patient. Questions regarding the procedure were encouraged
and answered. The patient understands and consents to the procedure.
An appropriate skin entry site was determined under fluoroscopy.
Site was marked, prepped with Betadine, draped in usual sterile
fashion, infiltrated locally with 1% lidocaine. A 22 gauge spinal
needle was advanced to the superior lateral margin of the femoral
head. 1 ml lidocaine 1% injected easily. Contrast injection of 2 ml
[4P] showed intraarticular spread without any intravascular
component. 120 mg Depo-Medrol and 5 ml lidocaine 1% was
administered. The patient tolerated procedure well, with no
immediate complication.
FLUOROSCOPY TIME:  9 seconds

[Series 1: dg fluoro guide ndl plc/bx · 1 of 1 slices shown]
[im 1/1]
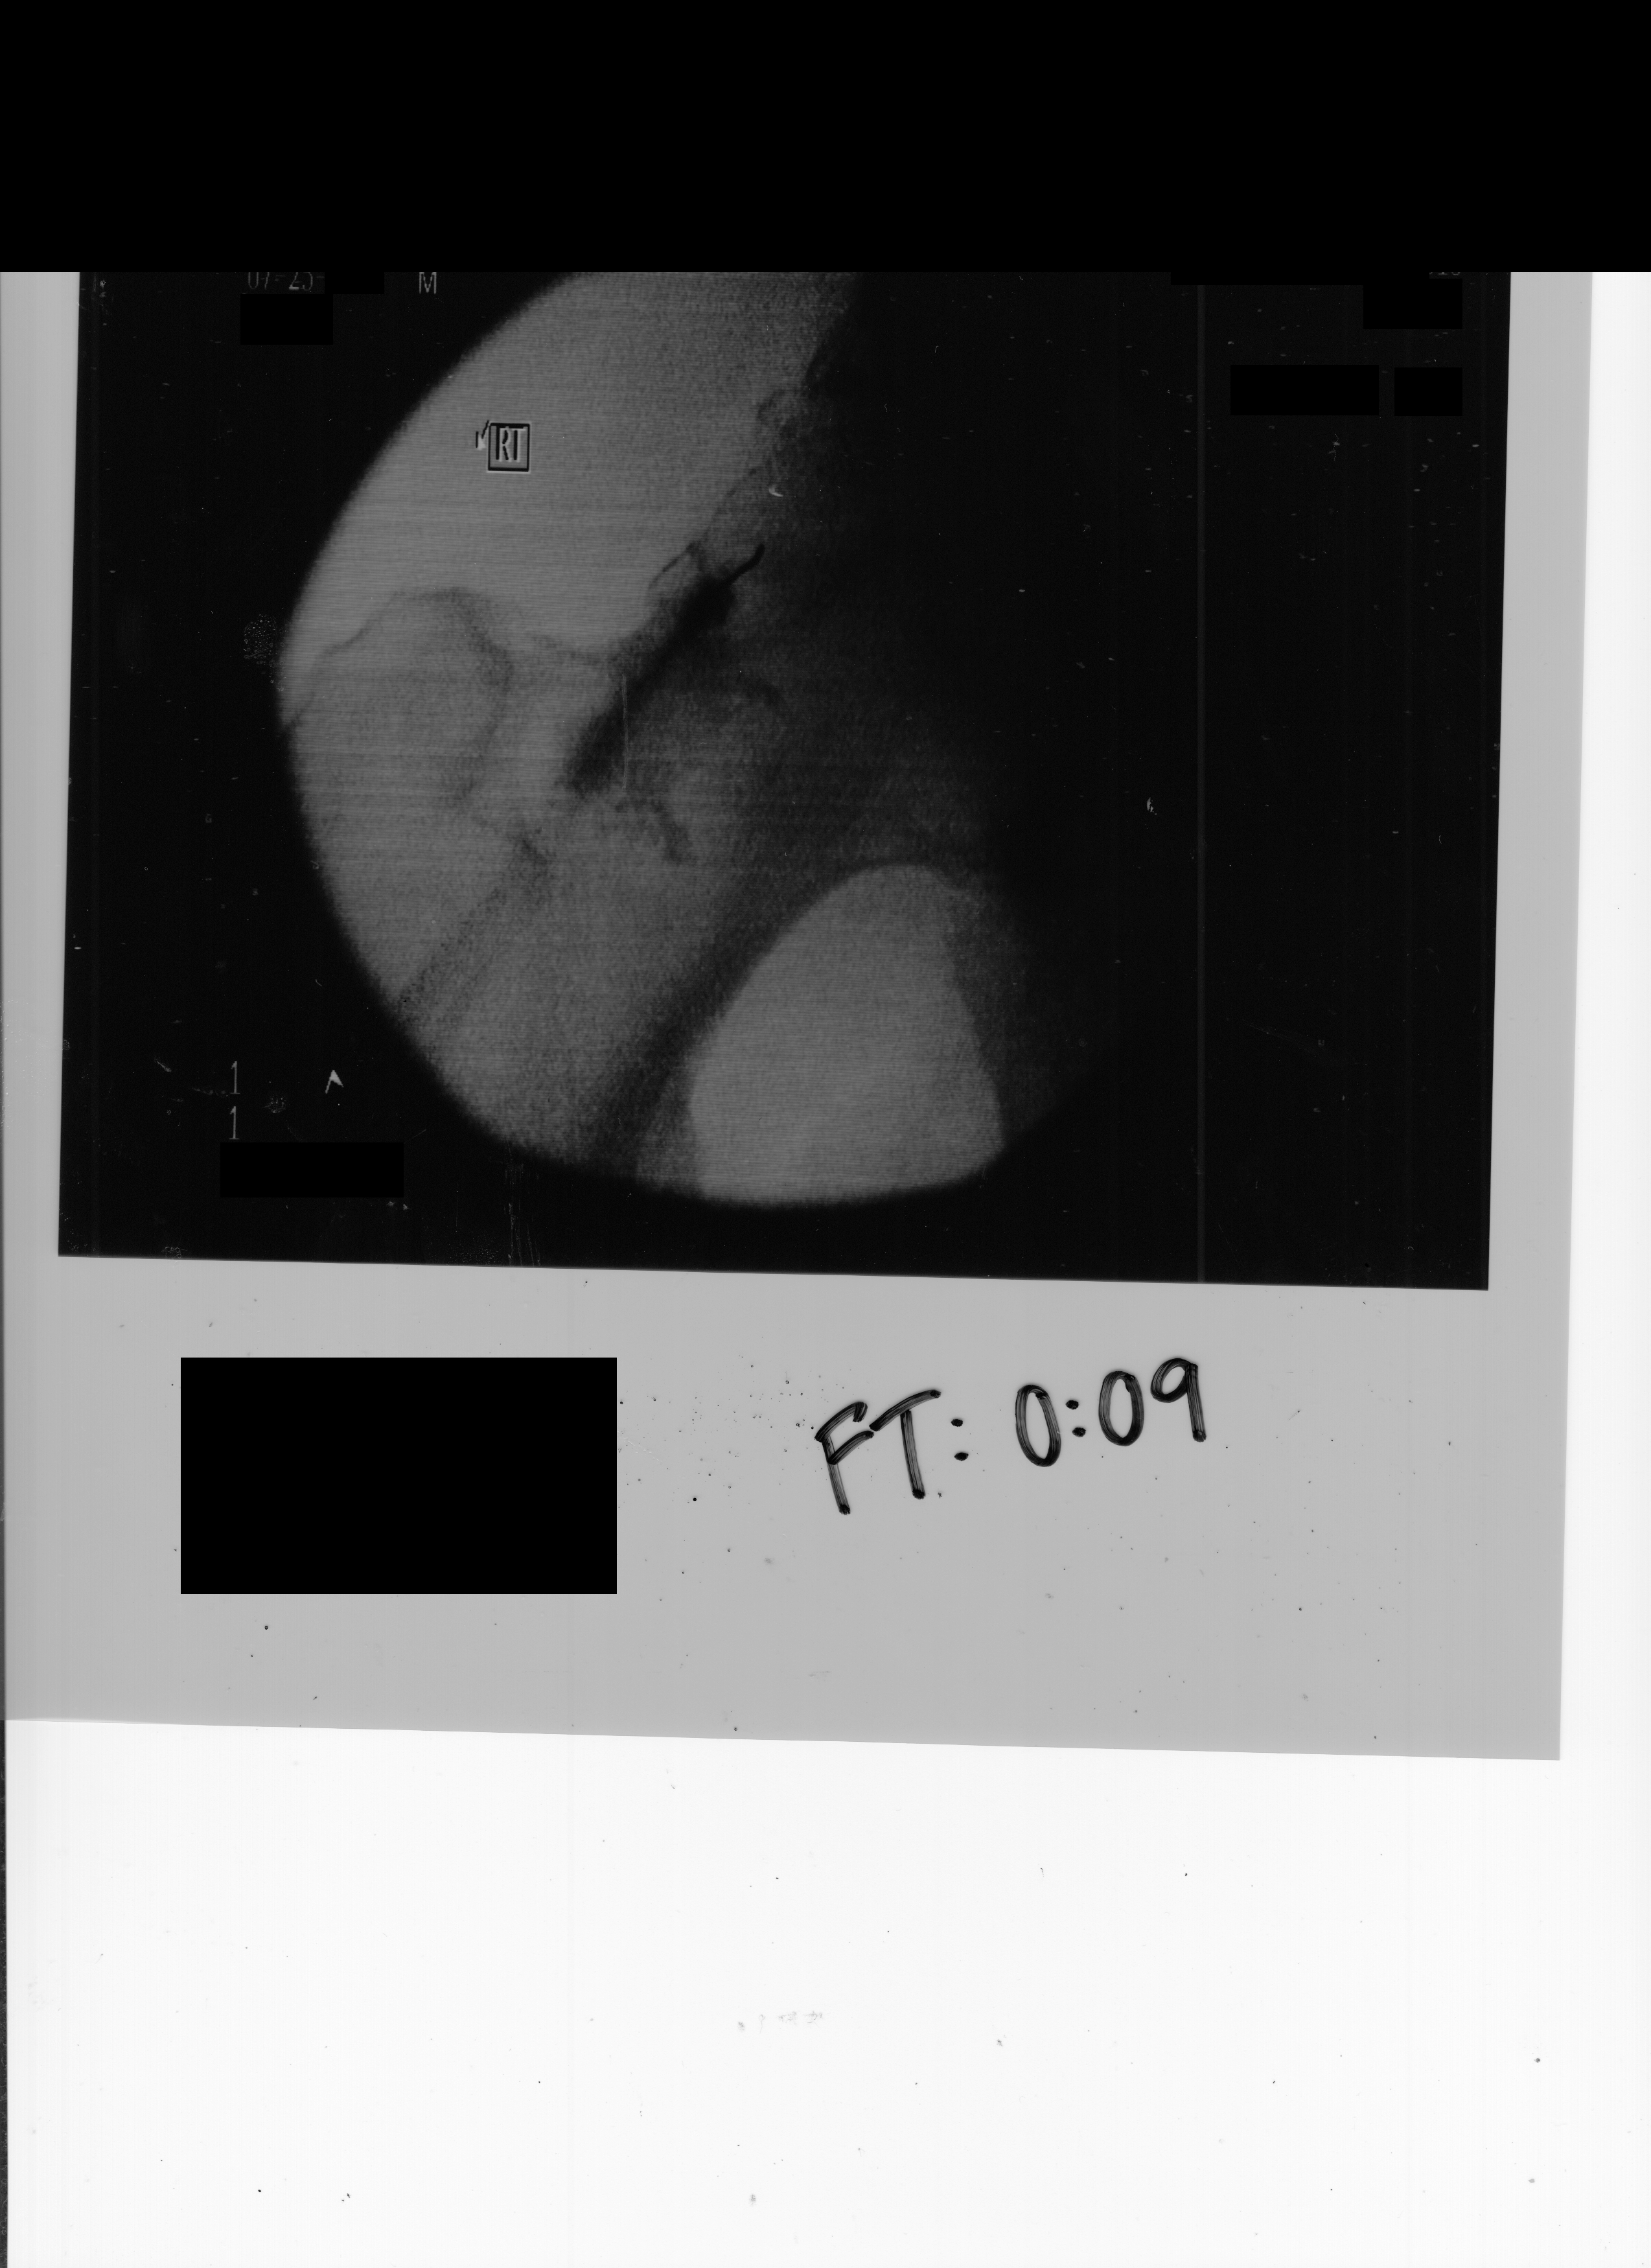

[1 of 1 positions shown; findings below may reference images not displayed]

IMPRESSION: 1. Technically successful right hip injection under fluoroscopy

## 2013-07-07 MED ORDER — IOHEXOL 180 MG/ML  SOLN
1.0000 mL | Freq: Once | INTRAMUSCULAR | Status: AC | PRN
  Administered 2013-07-07: 1 mL via INTRA_ARTICULAR

## 2013-07-07 MED ORDER — METHYLPREDNISOLONE ACETATE 40 MG/ML INJ SUSP (RADIOLOG
120.0000 mg | Freq: Once | INTRAMUSCULAR | Status: AC
  Administered 2013-07-07: 120 mg via INTRA_ARTICULAR

## 2013-07-25 NOTE — Progress Notes (Signed)
Surgery on 08/04/13.  Preop on 3/12 at 0830.  Need orders in EPIC.  Thank You.

## 2013-07-26 ENCOUNTER — Encounter (HOSPITAL_COMMUNITY): Payer: Self-pay | Admitting: Pharmacy Technician

## 2013-07-26 NOTE — Progress Notes (Signed)
Surgery on 08/04/13.  Preop on 07/28/13 at 0830.  Need orders in EPIC.  Thank You.

## 2013-07-27 ENCOUNTER — Telehealth: Payer: Self-pay | Admitting: Cardiology

## 2013-07-27 ENCOUNTER — Encounter: Payer: Self-pay | Admitting: Cardiology

## 2013-07-27 ENCOUNTER — Ambulatory Visit (INDEPENDENT_AMBULATORY_CARE_PROVIDER_SITE_OTHER): Payer: Medicare Other | Admitting: Cardiology

## 2013-07-27 VITALS — BP 122/79 | HR 65 | Ht 72.0 in | Wt 220.0 lb

## 2013-07-27 DIAGNOSIS — Z01810 Encounter for preprocedural cardiovascular examination: Secondary | ICD-10-CM | POA: Diagnosis not present

## 2013-07-27 DIAGNOSIS — I119 Hypertensive heart disease without heart failure: Secondary | ICD-10-CM | POA: Diagnosis not present

## 2013-07-27 DIAGNOSIS — I471 Supraventricular tachycardia, unspecified: Secondary | ICD-10-CM

## 2013-07-27 DIAGNOSIS — R55 Syncope and collapse: Secondary | ICD-10-CM

## 2013-07-27 DIAGNOSIS — I498 Other specified cardiac arrhythmias: Secondary | ICD-10-CM | POA: Diagnosis not present

## 2013-07-27 DIAGNOSIS — M1990 Unspecified osteoarthritis, unspecified site: Secondary | ICD-10-CM

## 2013-07-27 NOTE — Assessment & Plan Note (Signed)
The patient has not been experiencing any recurrence of SVT

## 2013-07-27 NOTE — Assessment & Plan Note (Signed)
Patient has had no recurrence of his episodes of syncope

## 2013-07-27 NOTE — Assessment & Plan Note (Signed)
Blood pressure has been remaining stable on current therapy.  No dizziness or syncope.  No chest pain or shortness of breath.  He has been less physically active because of his right hip pain.  He is presently on a tapering dose of steroids.

## 2013-07-27 NOTE — Assessment & Plan Note (Signed)
The patient has previously had bilateral knee replacements and left hip replacement.  He will now have a right hip replacement next week.  He recently returned from [place] where he was unable to play golf because of the right hip pain.  The prednisone Dosepak has helped somewhat to relieve the discomfort.

## 2013-07-27 NOTE — Telephone Encounter (Signed)
Received request from Nurse fax box, documents faxed for surgical clearance. To: [name] [name] Fax number: [phone] Attention: [phone]

## 2013-07-27 NOTE — Patient Instructions (Signed)
Your physician recommends that you continue on your current medications as directed. Please refer to the Current Medication list given to you today.  Your physician wants you to follow-up in: 6 month ov/ekg You will receive a reminder letter in the mail two months in advance. If you don't receive a letter, please call our office to schedule the follow-up appointment.  

## 2013-07-27 NOTE — Progress Notes (Signed)
[name] [name] Date of Birth:  05/16/1936 [address] [place], [place]  [address] [phone]         Fax   [phone]  History of Present Illness: This pleasant 78 year old gentleman is seen for a scheduled followup office visit.  Since we last saw him he has had successful rotator cuff surgery on his right shoulder by Dr. Netta Cedars.  Since last visit he has developed severe osteoarthritis of the right hip and is scheduled for total right hip replacement with an anterior approach by Dr. Alvan Dame on Thursday, 08/04/2013.   He has a past history of essential hypertension and a past history of paroxysmal supraventricular tachycardia. He does not have any history of ischemic heart disease. He had a cardiac catheterization in 2001 showing normal coronary arteries he had a normal nuclear stress test in 2008. He had an echocardiogram in 2011 which showed mild LVH with normal systolic function and with impaired relaxation. He has mild aortic sclerosis by echo as well as trace mitral regurgitation.    His most recent nuclear stress test was 12/16/12 showing an ejection fraction of 59% and no ischemia or wall motion abnormalities.  He does have a chronically abnormal electrocardiogram with abnormal T waves.  Current Outpatient Prescriptions  Medication Sig Dispense Refill  . aspirin EC 81 MG tablet Take 81 mg by mouth every morning.      . celecoxib (CELEBREX) 200 MG capsule Take 200 mg by mouth 2 (two) times daily as needed for mild pain.      . Cholecalciferol (VITAMIN D PO) Take 1,000 mg by mouth daily.       . lansoprazole (PREVACID) 30 MG capsule Take 30 mg by mouth daily as needed (heart  burn).      . metoprolol succinate (TOPROL-XL) 50 MG 24 hr tablet Take 50 mg by mouth every morning. Take with or immediately following a meal.      . polycarbophil (FIBERCON) 625 MG tablet Take 625 mg by mouth daily as needed for mild constipation.      . potassium citrate (UROCIT-K) 10 MEQ (1080  MG) SR tablet Take 10 mEq by mouth 3 (three) times daily with meals.      . predniSONE (DELTASONE) 5 MG tablet Take 5 mg by mouth daily with breakfast. Pt is on a taper dose      . Propylene Glycol (SYSTANE BALANCE) 0.6 % SOLN Place 1 drop into both eyes 3 (three) times daily as needed (dry eyes).      Marland Kitchen telmisartan-hydrochlorothiazide (MICARDIS HCT) 80-12.5 MG per tablet Take 1 tablet by mouth every morning.       No current facility-administered medications for this visit.    Allergies  Allergen Reactions  . Hydrocodone-Acetaminophen Other (See Comments)    Confusion  . Ramipril     Cough    Patient Active Problem List   Diagnosis Date Noted  . Dyspnea on exertion 12/14/2012  . Unspecified arthropathy, lower leg 10/19/2012  . Secondary renovascular hypertension, benign 10/19/2012  . Acute blood loss anemia 10/05/2012  . Unspecified constipation 10/05/2012  . GERD (gastroesophageal reflux disease) 10/05/2012  . Syncope and collapse 09/18/2012  . Hypotension 09/18/2012  . Confusion 09/18/2012  . CKD (chronic kidney disease), stage III 09/18/2012  . Dehydration 09/18/2012  . Expected blood loss anemia 09/15/2012  . Overweight (BMI 25.0-29.9) 09/15/2012  . S/P bilateral TKA 09/13/2012  . Benign hypertensive heart disease without heart failure 07/15/2012  . PSVT (paroxysmal  supraventricular tachycardia) 07/15/2012  . Osteoarthritis 06/24/2011  . Dizziness 01/07/2011  . HTN (hypertension) 01/07/2011  . SVT (supraventricular tachycardia) 01/07/2011    History  Smoking status  . Never Smoker   Smokeless tobacco  . Not on file    History  Alcohol Use  . Yes    Comment: Drink Wine almost daily    Family History  Problem Relation Age of Onset  . Other Neg Hx     Review of Systems: Constitutional: no fever chills diaphoresis or fatigue or change in weight.  Head and neck: no hearing loss, no epistaxis, no photophobia or visual disturbance. Respiratory: No cough,  shortness of breath or wheezing. Cardiovascular: No chest pain peripheral edema, palpitations. Gastrointestinal: No abdominal distention, no abdominal pain, no change in bowel habits hematochezia or melena. Genitourinary: No dysuria, no frequency, no urgency, no nocturia. Musculoskeletal:No arthralgias, no back pain, no gait disturbance or myalgias. Neurological: No dizziness, no headaches, no numbness, no seizures, no syncope, no weakness, no tremors. Hematologic: No lymphadenopathy, no easy bruising. Psychiatric: No confusion, no hallucinations, no sleep disturbance.    Physical Exam: Filed Vitals:   07/27/13 1052  BP: 122/79  Pulse: 65   the general appearance reveals a well-developed well-nourished gentleman in no distress.The head and neck exam reveals pupils equal and reactive.  Extraocular movements are full.  There is no scleral icterus.  The mouth and pharynx are normal.  The neck is supple.  The carotids reveal no bruits.  The jugular venous pressure is normal.  The  thyroid is not enlarged.  There is no lymphadenopathy.  The chest is clear to percussion and auscultation.  There are no rales or rhonchi.  Expansion of the chest is symmetrical.  The precordium is quiet.  The first heart sound is normal.  The second heart sound is physiologically split.  There is no murmur gallop rub or click.  There is no abnormal lift or heave.  The abdomen is soft and nontender.  The bowel sounds are normal.  The liver and spleen are not enlarged.  There are no abdominal masses.  There are no abdominal bruits.  Extremities reveal good pedal pulses.  There is no phlebitis or edema.  There is no cyanosis or clubbing.  Strength is normal and symmetrical in all extremities.  There is no lateralizing weakness.  There are no sensory deficits.  The skin is warm and dry.  There is no rash.  EKG today shows normal sinus rhythm with nonspecific ST-T wave abnormality slightly improved since 12/09/12.   Assessment  / Plan: The patient is doing well on current therapy.  No change in medication today.  Recheck in 6 months for office visit and EKG. from the cardiac standpoint we are clearing him for right hip surgery next week.  Continue current medication.

## 2013-07-28 ENCOUNTER — Encounter (HOSPITAL_COMMUNITY): Payer: Self-pay

## 2013-07-28 ENCOUNTER — Encounter (HOSPITAL_COMMUNITY)
Admission: RE | Admit: 2013-07-28 | Discharge: 2013-07-28 | Disposition: A | Discharge status: Home / Self Care | Payer: Medicare Other | Source: Ambulatory Visit | Attending: Orthopedic Surgery | Admitting: Orthopedic Surgery

## 2013-07-28 ENCOUNTER — Ambulatory Visit (HOSPITAL_COMMUNITY)
Admission: RE | Admit: 2013-07-28 | Discharge: 2013-07-28 | Disposition: A | Discharge status: Home / Self Care | Payer: Medicare Other | Source: Ambulatory Visit | Attending: Orthopedic Surgery | Admitting: Orthopedic Surgery

## 2013-07-28 DIAGNOSIS — Z01818 Encounter for other preprocedural examination: Secondary | ICD-10-CM | POA: Diagnosis not present

## 2013-07-28 DIAGNOSIS — Z01812 Encounter for preprocedural laboratory examination: Secondary | ICD-10-CM | POA: Insufficient documentation

## 2013-07-28 DIAGNOSIS — I499 Cardiac arrhythmia, unspecified: Secondary | ICD-10-CM | POA: Insufficient documentation

## 2013-07-28 LAB — URINALYSIS, ROUTINE W REFLEX MICROSCOPIC
Bilirubin Urine: NEGATIVE
GLUCOSE, UA: NEGATIVE mg/dL
Hgb urine dipstick: NEGATIVE
KETONES UR: NEGATIVE mg/dL
LEUKOCYTES UA: NEGATIVE
Nitrite: NEGATIVE
PH: 5 (ref 5.0–8.0)
Protein, ur: NEGATIVE mg/dL
Specific Gravity, Urine: 1.014 (ref 1.005–1.030)
Urobilinogen, UA: 0.2 mg/dL (ref 0.0–1.0)

## 2013-07-28 LAB — SURGICAL PCR SCREEN
MRSA, PCR: NEGATIVE
Staphylococcus aureus: POSITIVE — AB

## 2013-07-28 LAB — CBC
HCT: 44.4 % (ref 39.0–52.0)
HEMOGLOBIN: 15.1 g/dL (ref 13.0–17.0)
MCH: 30.2 pg (ref 26.0–34.0)
MCHC: 34 g/dL (ref 30.0–36.0)
MCV: 88.8 fL (ref 78.0–100.0)
Platelets: 211 10*3/uL (ref 150–400)
RBC: 5 MIL/uL (ref 4.22–5.81)
RDW: 14.1 % (ref 11.5–15.5)
WBC: 9.8 10*3/uL (ref 4.0–10.5)

## 2013-07-28 LAB — BASIC METABOLIC PANEL
BUN: 28 mg/dL — ABNORMAL HIGH (ref 6–23)
CALCIUM: 9.6 mg/dL (ref 8.4–10.5)
CO2: 25 meq/L (ref 19–32)
Chloride: 98 mEq/L (ref 96–112)
Creatinine, Ser: 1.15 mg/dL (ref 0.50–1.35)
GFR calc Af Amer: 70 mL/min — ABNORMAL LOW (ref >90)
GFR calc non Af Amer: 60 mL/min — ABNORMAL LOW (ref >90)
GLUCOSE: 94 mg/dL (ref 70–99)
Potassium: 4.5 mEq/L (ref 3.7–5.3)
Sodium: 136 mEq/L — ABNORMAL LOW (ref 137–147)

## 2013-07-28 LAB — PROTIME-INR
INR: 0.96 (ref 0.00–1.49)
PROTHROMBIN TIME: 12.6 s (ref 11.6–15.2)

## 2013-07-28 LAB — APTT: aPTT: 27 seconds (ref 24–37)

## 2013-07-28 IMAGING — CR DG CHEST 2V
2 series · 2 of 2 positions shown · non-contrast
Comparison: DG FLUORO GUIDE NDL KURZ/BX dated [DATE]; DG CHEST 1V
PORT dated [DATE]

CLINICAL DATA: Preoperative study, history of cardiac dysrhythmia

EXAM:
CHEST  2 VIEW

[w chest pa]
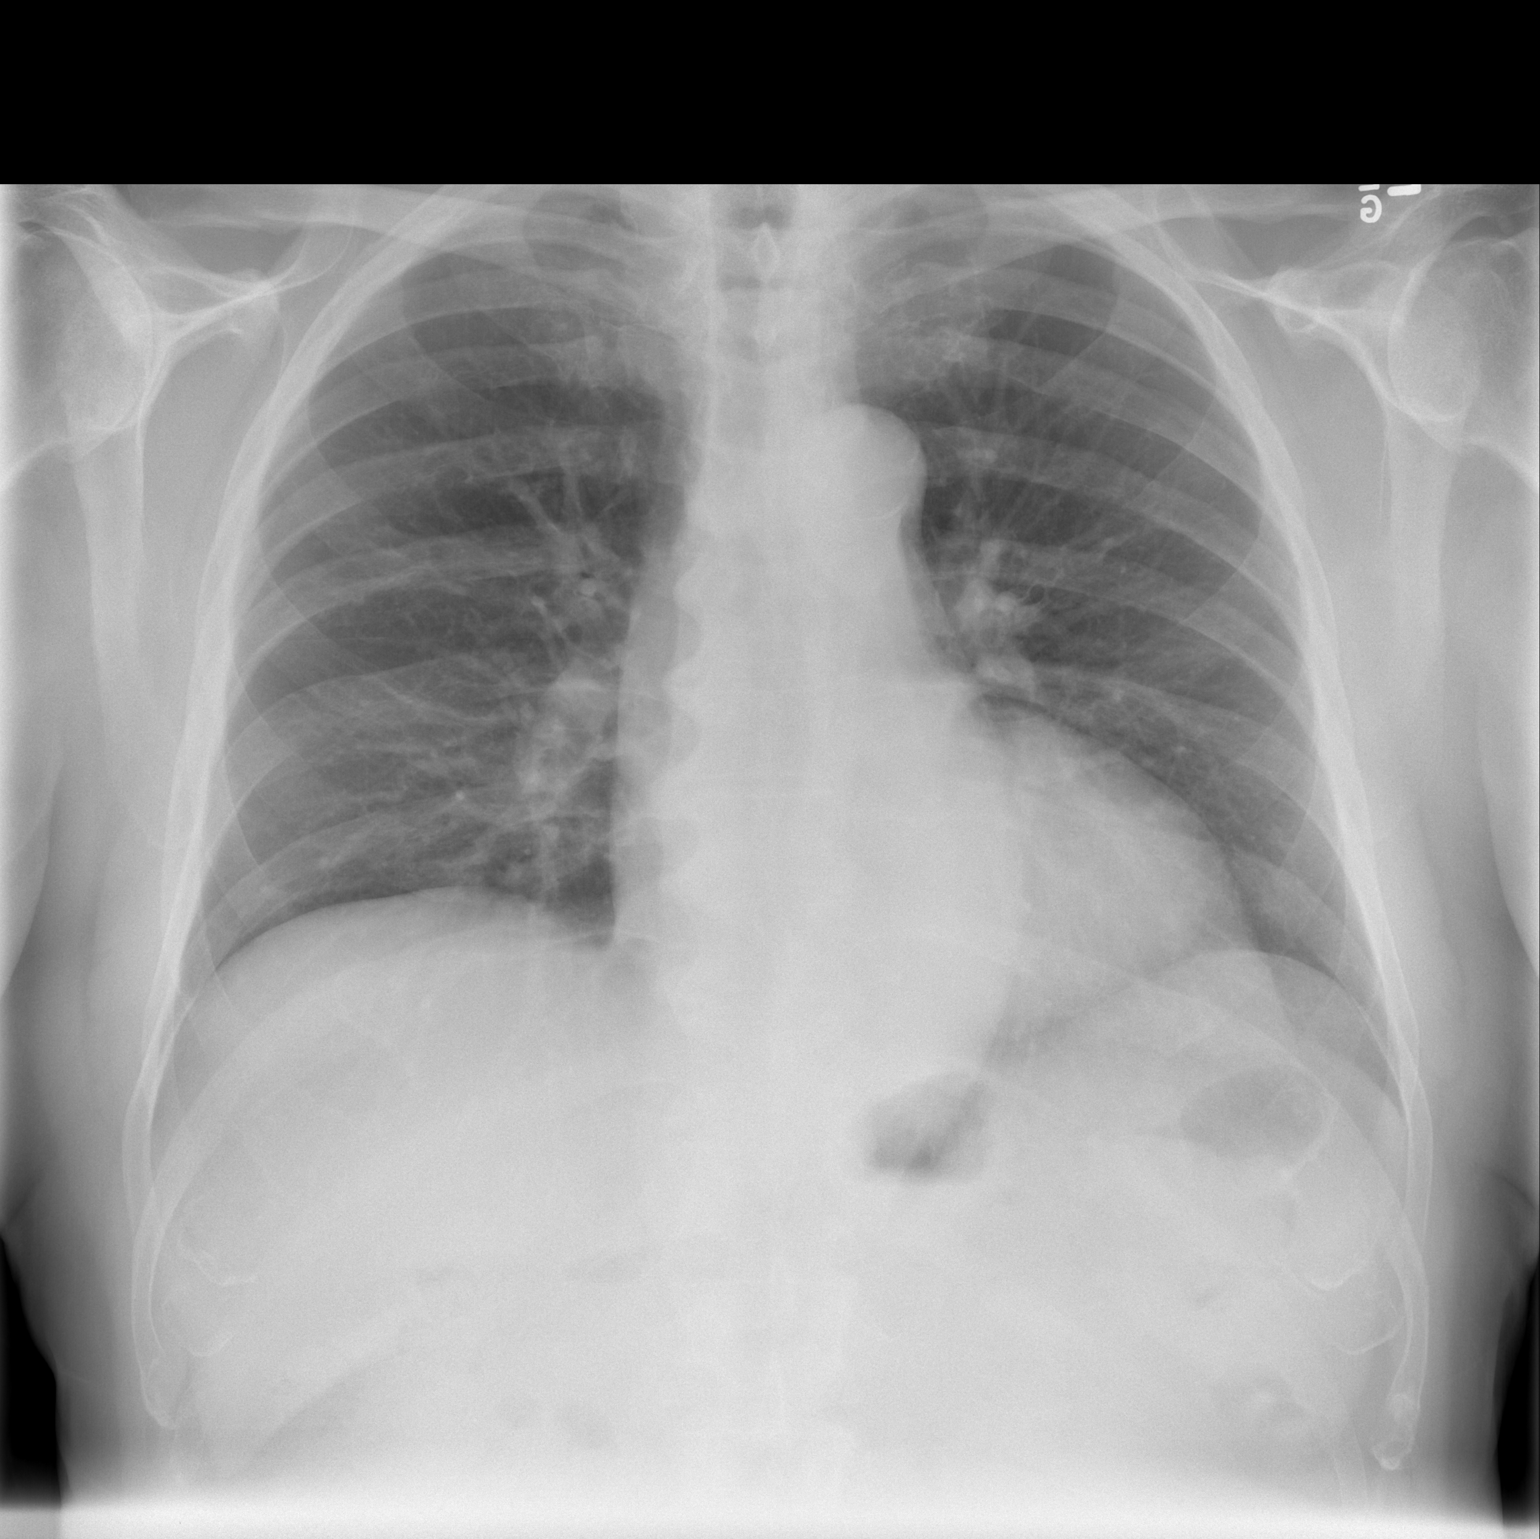

[w chest lat]
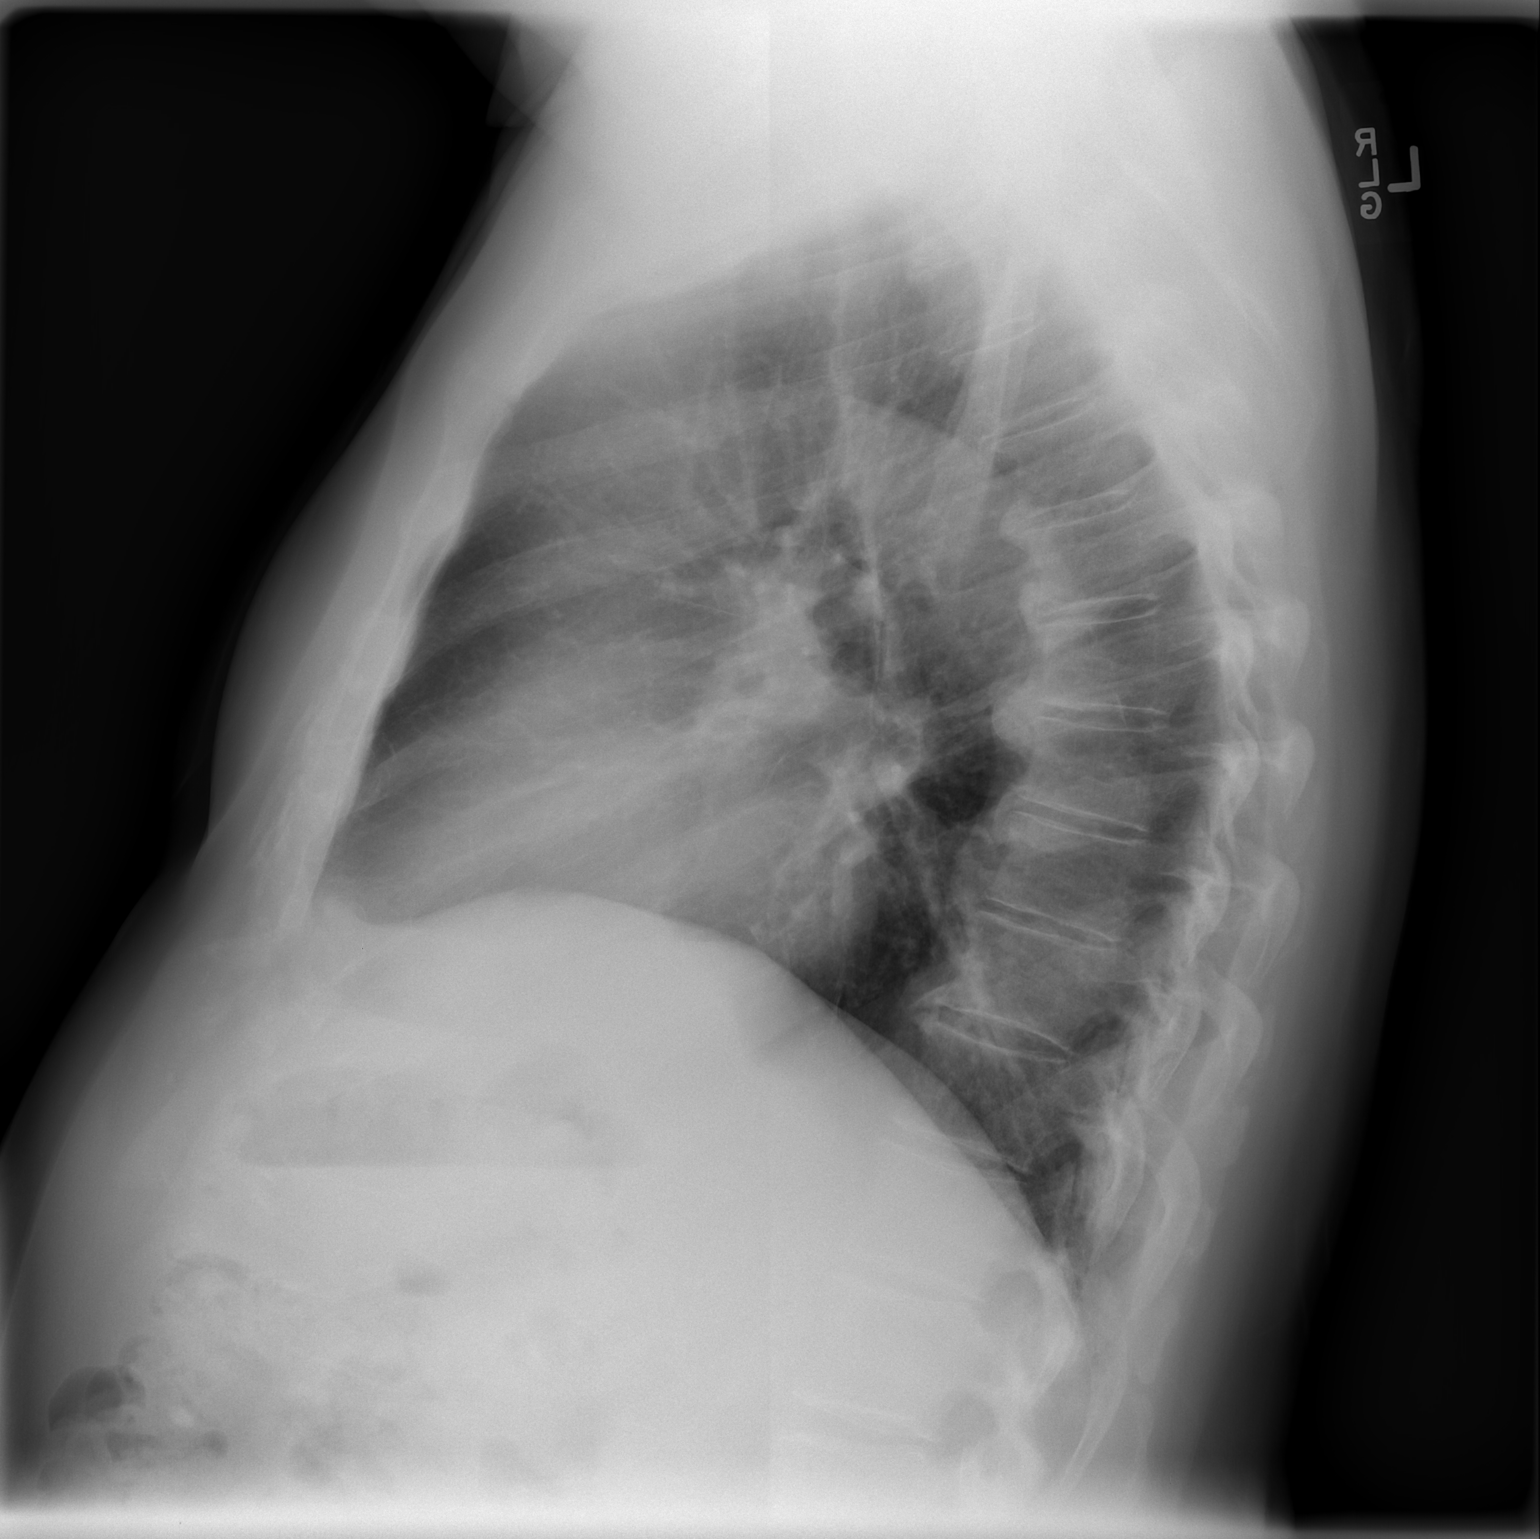

[2 of 2 positions shown; findings below may reference images not displayed]

FINDINGS: The lungs are reasonably well inflated. There is no focal
infiltrate. The interstitial markings are coarse bilaterally and
appear stable. The cardiac silhouette is top-normal in size. There
is tortuosity of the descending thoracic aorta. There is no pleural
effusion. The observed portions of the bony thorax exhibit no acute
abnormalities.
IMPRESSION: There is no evidence of active cardiopulmonary disease.

## 2013-07-28 NOTE — Pre-Procedure Instructions (Signed)
PT HAS EKG AND OFFICE NOTE AND CARDIAC CLEARANCE FROM DR. BRACKBILL FROM 07/27/13.  PT HAS MEDICAL CLEARANCE FROM DR. Delane Ginger. G[place].  NORMAL NUCLEAR STRESS TEST REPORT IN EPIC FROM 12/16/12. CXR WAS DONE TODAY PREOP AT [facility].

## 2013-07-28 NOTE — Patient Instructions (Signed)
   YOUR SURGERY IS SCHEDULED AT [facility]  ON:  Thursday  3/19  REPORT TO  SHORT STAY CENTER AT:  6:30 AM      PHONE # FOR SHORT STAY IS [phone]  DO NOT EAT OR DRINK ANYTHING AFTER MIDNIGHT THE NIGHT BEFORE YOUR SURGERY.  YOU MAY BRUSH YOUR TEETH, RINSE OUT YOUR MOUTH--BUT NO WATER, NO FOOD, NO CHEWING GUM, NO MINTS, NO CANDIES, NO CHEWING TOBACCO.  PLEASE TAKE THE FOLLOWING MEDICATIONS THE AM OF YOUR SURGERY WITH A FEW SIPS OF WATER:  LANSOPRAZOLE, METOPROLOL, UROCIT-K.  DO NOT BRING VALUABLES, MONEY, CREDIT CARDS.  DO NOT WEAR JEWELRY, MAKE-UP, NAIL POLISH AND NO METAL PINS OR CLIPS IN YOUR HAIR. CONTACT LENS, DENTURES / PARTIALS, GLASSES SHOULD NOT BE WORN TO SURGERY AND IN MOST CASES-HEARING AIDS WILL NEED TO BE REMOVED.  BRING YOUR GLASSES CASE, ANY EQUIPMENT NEEDED FOR YOUR CONTACT LENS. FOR PATIENTS ADMITTED TO THE HOSPITAL--CHECK OUT TIME THE DAY OF DISCHARGE IS 11:00 AM.  ALL INPATIENT ROOMS ARE PRIVATE - WITH BATHROOM, TELEPHONE, TELEVISION AND WIFI INTERNET.                                                    PLEASE READ OVER ANY  FACT SHEETS THAT YOU WERE GIVEN: MRSA INFORMATION, BLOOD TRANSFUSION INFORMATION, INCENTIVE SPIROMETER INFORMATION.  FAILURE TO FOLLOW THESE INSTRUCTIONS MAY RESULT IN THE CANCELLATION OF YOUR SURGERY. PLEASE BE AWARE THAT YOU MAY NEED ADDITIONAL BLOOD DRAWN DAY OF YOUR SURGERY  PATIENT SIGNATURE_________________________________

## 2013-07-28 NOTE — Pre-Procedure Instructions (Signed)
PT'S PREOP BMET REPORT - BUN 28 - FAXED TO DR. Aurea Graff OFFICE FOR REVIEW.

## 2013-07-31 NOTE — H&P (Signed)
TOTAL HIP ADMISSION H&P  Patient is admitted for right total hip arthroplasty, anterior approach.  Subjective:  Chief Complaint: Right hip OA /pain  HPI: Kathie Dike, 78 y.o. male, has a history of pain and functional disability in the right hip(s) due to arthritis and patient has failed non-surgical conservative treatments for greater than 12 weeks to include NSAID's and/or analgesics, corticosteriod injections, use of assistive devices and activity modification.  Onset of symptoms was abrupt starting 2-3 months ago with rapidlly worsening course since that time.The patient noted no past surgery on the right hip(s).  Patient currently rates pain in the right hip at 9 out of 10 with activity. Patient has worsening of pain with activity and weight bearing, trendelenberg gait, pain that interfers with activities of daily living and pain with passive range of motion. Patient has evidence of periarticular osteophytes and joint space narrowing by imaging studies. This condition presents safety issues increasing the risk of falls.  There is no current active infection.  Risks, benefits and expectations were discussed with the patient.  Risks including but not limited to the risk of anesthesia, blood clots, nerve damage, blood vessel damage, failure of the prosthesis, infection and up to and including death.  Patient understand the risks, benefits and expectations and wishes to proceed with surgery.   D/C Plans:   Home with HHPT  Post-op Meds:   No Rx given  Tranexamic Acid:   To be given  Decadron:    To be given  FYI:    ASA post-op  APAP post-op (Went crazy with anything stronger)   Patient Active Problem List   Diagnosis Date Noted  . Dyspnea on exertion 12/14/2012  . Unspecified arthropathy, lower leg 10/19/2012  . Secondary renovascular hypertension, benign 10/19/2012  . Acute blood loss anemia 10/05/2012  . Unspecified constipation 10/05/2012  . GERD (gastroesophageal reflux disease)  10/05/2012  . Syncope and collapse 09/18/2012  . Hypotension 09/18/2012  . Confusion 09/18/2012  . CKD (chronic kidney disease), stage III 09/18/2012  . Dehydration 09/18/2012  . Expected blood loss anemia 09/15/2012  . Overweight (BMI 25.0-29.9) 09/15/2012  . S/P bilateral TKA 09/13/2012  . Benign hypertensive heart disease without heart failure 07/15/2012  . PSVT (paroxysmal supraventricular tachycardia) 07/15/2012  . Osteoarthritis 06/24/2011  . Dizziness 01/07/2011  . HTN (hypertension) 01/07/2011  . SVT (supraventricular tachycardia) 01/07/2011   Past Medical History  Diagnosis Date  . Hypertension   . History of paroxysmal supraventricular tachycardia     [name]  . Vertigo, benign positional     [name]  . Palpitations     History of ventricular ectopy. He is status post radiofrequency ablation at the [facility] in the early 90's. for PVC's  . BPH (benign prostatic hyperplasia)     s/p TURP in July 2012  . Aortic sclerosis     MILD PER ECHO AS WELL AS TRACE MITRAL REGURGITATION - PER CARDIOLOGY OFFICE NOTES 06/2012  . EKG abnormalities     INCOMPLETE RIGHT BUNDLE BRANCH BLOCK AND CHRONIC T-WAVE ABNORMALITIES - PER CARDIOLOGY OFFICE NOTES 06/2012  . Complication of anesthesia     HX OF MINOR ITCHING AFTER ANESTHESIA FOR HIP REPLACEMENT AND FOR COLON SURGERY  . GERD (gastroesophageal reflux disease)   . Arthritis     Past Surgical History  Procedure Laterality Date  . Hernia repair  1990  . Ankle surgery    .  Tonsillectomy  Age 54  . Kidney stone surgery    . US echocardiography  06-20-2009    Est EF 55-60%  . Cardiac catheterization  2002    NORMAL CORONARY ARTERIES  . Joint replacement  ? 2009    LEFT HIP REPLACEMENT  . Colon surgery  ? 1994  . Transurethral resection of prostate    . Total knee arthroplasty Bilateral 09/13/2012    Procedure: TOTAL KNEE BILATERAL;  Surgeon:  Mauri Pole, MD;  Location: [facility];  Service: Orthopedics;  Laterality: Bilateral;  . Right shoulder rotator cuff repair      No prescriptions prior to admission   Allergies  Allergen Reactions  . Hydrocodone-Acetaminophen Other (See Comments)    Confusion  . Ramipril     Cough    History  Substance Use Topics  . Smoking status: Never Smoker   . Smokeless tobacco: Not on file  . Alcohol Use: Yes     Comment: Drink Wine almost daily    Family History  Problem Relation Age of Onset  . Other Neg Hx      Review of Systems  Constitutional: Negative.   HENT: Positive for tinnitus.   Eyes: Negative.   Respiratory: Negative.   Cardiovascular: Negative.   Gastrointestinal: Positive for heartburn.  Genitourinary: Positive for frequency.  Musculoskeletal: Positive for joint pain and myalgias.  Skin: Negative.   Neurological: Negative.   Endo/Heme/Allergies: Negative.   Psychiatric/Behavioral: Negative.     Objective:  Physical Exam  Constitutional: He is oriented to person, place, and time. He appears well-developed and well-nourished.  HENT:  Head: Normocephalic and atraumatic.  Mouth/Throat: Oropharynx is clear and moist.  Eyes: Pupils are equal, round, and reactive to light.  Neck: Neck supple. No JVD present. No tracheal deviation present. No thyromegaly present.  Cardiovascular: Normal rate, regular rhythm, normal heart sounds and intact distal pulses.   Respiratory: Effort normal and breath sounds normal. No stridor. No respiratory distress. He has no wheezes.  GI: Soft. There is no tenderness. There is no guarding.  Musculoskeletal:       Right hip: He exhibits decreased range of motion, decreased strength, tenderness and bony tenderness. He exhibits no swelling, no deformity and no laceration.  Lymphadenopathy:    He has no cervical adenopathy.  Neurological: He is alert and oriented to person, place, and time.  Skin: Skin is warm and dry.  Psychiatric: He has  a normal mood and affect.    Labs:  Estimated body mass index is 29.56 kg/(m^2) as calculated from the following:   Height as of 03/09/13: 6' (1.829 m).   Weight as of 03/09/13: 98.884 kg (218 lb).   Imaging Review Plain radiographs demonstrate severe degenerative joint disease of the right hip(s). The bone quality appears to be good for age and reported activity level.  Assessment/Plan:  End stage arthritis, right hip(s)  The patient history, physical examination, clinical judgement of the provider and imaging studies are consistent with end stage degenerative joint disease of the right hip(s) and total hip arthroplasty is deemed medically necessary. The treatment options including medical management, injection therapy, arthroscopy and arthroplasty were discussed at length. The risks and benefits of total hip arthroplasty were presented and reviewed. The risks due to aseptic loosening, infection, stiffness, dislocation/subluxation,  thromboembolic complications and other imponderables were discussed.  The patient acknowledged the explanation, agreed to proceed with the plan and consent was signed. Patient is being admitted for inpatient treatment for surgery, pain control,  PT, OT, prophylactic antibiotics, VTE prophylaxis, progressive ambulation and ADL's and discharge planning.The patient is planning to be discharged home with home health services .     West Pugh Marcoantonio Legault   PAC  07/31/2013, 2:15 PM

## 2013-08-03 NOTE — Anesthesia Preprocedure Evaluation (Addendum)
Anesthesia Evaluation  Patient identified by MRN, date of birth, ID band Patient awake    Reviewed: Allergy & Precautions, H&P , NPO status , Patient's Chart, lab work & pertinent test results, reviewed documented beta blocker date and time   Airway Mallampati: II TM Distance: >3 FB Neck ROM: full    Dental no notable dental hx. (+) Teeth Intact, Dental Advisory Given   Pulmonary neg pulmonary ROS, shortness of breath and with exertion,  breath sounds clear to auscultation  Pulmonary exam normal       Cardiovascular Exercise Tolerance: Good hypertension, Pt. on medications and Pt. on home beta blockers + dysrhythmias Supra Ventricular Tachycardia Rhythm:regular Rate:Normal  Had ablation for frequent PVCs. ICRBBB T wave changes   Neuro/Psych negative neurological ROS  negative psych ROS   GI/Hepatic negative GI ROS, Neg liver ROS, GERD-  Medicated and Controlled,  Endo/Other  negative endocrine ROS  Renal/GU Renal diseaseStage 3 chronic kidney disease  negative genitourinary   Musculoskeletal   Abdominal   Peds  Hematology negative hematology ROS (+)   Anesthesia Other Findings   Reproductive/Obstetrics negative OB ROS                          Anesthesia Physical Anesthesia Plan  ASA: III  Anesthesia Plan: Spinal   Post-op Pain Management:    Induction:   Airway Management Planned: Simple Face Mask  Additional Equipment:   Intra-op Plan:   Post-operative Plan:   Informed Consent: I have reviewed the patients History and Physical, chart, labs and discussed the procedure including the risks, benefits and alternatives for the proposed anesthesia with the patient or authorized representative who has indicated his/her understanding and acceptance.   Dental Advisory Given  Plan Discussed with: CRNA and Surgeon  Anesthesia Plan Comments:        Anesthesia Quick Evaluation

## 2013-08-04 ENCOUNTER — Inpatient Hospital Stay (HOSPITAL_COMMUNITY): Payer: Medicare Other

## 2013-08-04 ENCOUNTER — Inpatient Hospital Stay (HOSPITAL_COMMUNITY)
Admission: RE | Admit: 2013-08-04 | Discharge: 2013-08-05 | DRG: 470 | Disposition: A | Discharge status: Home Health | Payer: Medicare Other | Source: Ambulatory Visit | Attending: Orthopedic Surgery | Admitting: Orthopedic Surgery

## 2013-08-04 ENCOUNTER — Encounter (HOSPITAL_COMMUNITY): Payer: Self-pay

## 2013-08-04 ENCOUNTER — Inpatient Hospital Stay (HOSPITAL_COMMUNITY): Payer: Medicare Other | Admitting: Anesthesiology

## 2013-08-04 ENCOUNTER — Encounter (HOSPITAL_COMMUNITY): Payer: Medicare Other | Admitting: Anesthesiology

## 2013-08-04 ENCOUNTER — Encounter (HOSPITAL_COMMUNITY)
Admission: RE | Disposition: A | Discharge status: Home Health | Payer: Self-pay | Source: Ambulatory Visit | Attending: Orthopedic Surgery

## 2013-08-04 DIAGNOSIS — E669 Obesity, unspecified: Secondary | ICD-10-CM | POA: Diagnosis not present

## 2013-08-04 DIAGNOSIS — D62 Acute posthemorrhagic anemia: Secondary | ICD-10-CM | POA: Diagnosis not present

## 2013-08-04 DIAGNOSIS — I10 Essential (primary) hypertension: Secondary | ICD-10-CM | POA: Diagnosis not present

## 2013-08-04 DIAGNOSIS — Z96659 Presence of unspecified artificial knee joint: Secondary | ICD-10-CM

## 2013-08-04 DIAGNOSIS — Z96649 Presence of unspecified artificial hip joint: Secondary | ICD-10-CM | POA: Diagnosis not present

## 2013-08-04 DIAGNOSIS — M1610 Unilateral primary osteoarthritis, unspecified hip: Principal | ICD-10-CM | POA: Diagnosis present

## 2013-08-04 DIAGNOSIS — K219 Gastro-esophageal reflux disease without esophagitis: Secondary | ICD-10-CM | POA: Diagnosis present

## 2013-08-04 DIAGNOSIS — Z6830 Body mass index (BMI) 30.0-30.9, adult: Secondary | ICD-10-CM | POA: Diagnosis not present

## 2013-08-04 DIAGNOSIS — M169 Osteoarthritis of hip, unspecified: Principal | ICD-10-CM | POA: Diagnosis present

## 2013-08-04 DIAGNOSIS — Z471 Aftercare following joint replacement surgery: Secondary | ICD-10-CM | POA: Diagnosis not present

## 2013-08-04 DIAGNOSIS — M25559 Pain in unspecified hip: Secondary | ICD-10-CM | POA: Diagnosis not present

## 2013-08-04 HISTORY — PX: TOTAL HIP ARTHROPLASTY: SHX124

## 2013-08-04 LAB — TYPE AND SCREEN
ABO/RH(D): B NEG
ANTIBODY SCREEN: NEGATIVE

## 2013-08-04 IMAGING — CR DG HIP 1V PORT*R*
1 series · 1 of 1 positions shown · non-contrast
Comparison: No comparisons

CLINICAL DATA: Status post right hip joint prosthesis placement.

EXAM:
PORTABLE RIGHT HIP - 1 VIEW

[AP]
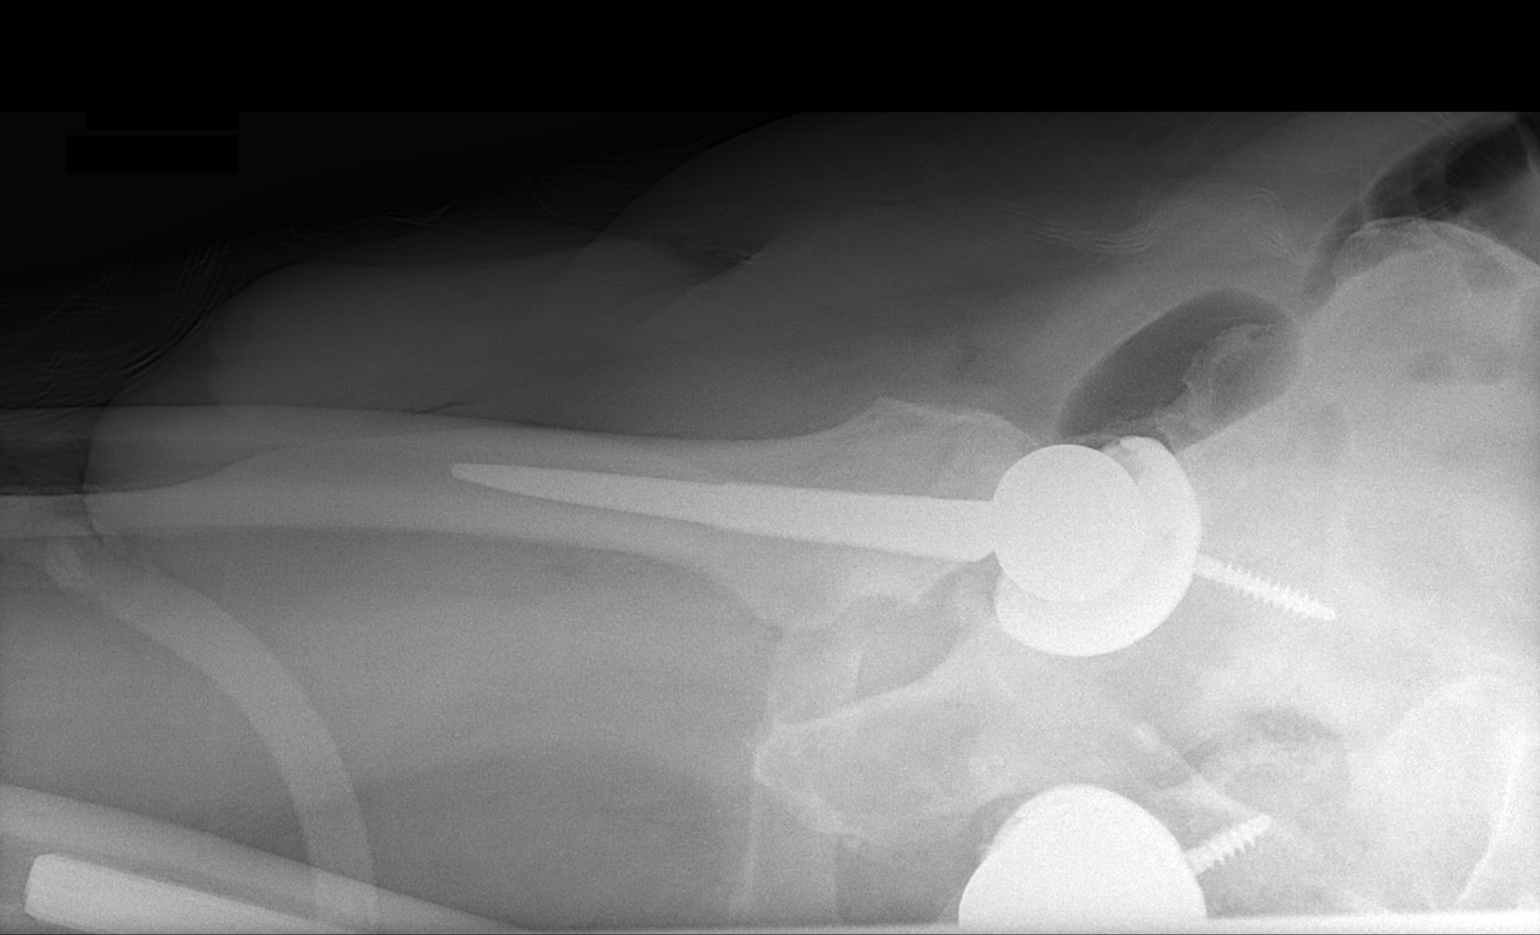

[1 of 1 positions shown; findings below may reference images not displayed]

FINDINGS: The patient has undergone placement of a prosthetic right hip joint.
Radiographic positioning of the prosthetic components is good. The
native bone exhibits no acute abnormality.
IMPRESSION: There is no immediate postprocedure complication following placement
of the right hip prosthesis.

## 2013-08-04 IMAGING — CR DG PORTABLE PELVIS
1 series · 1 of 1 positions shown · non-contrast
Comparison: DG FLUORO GUIDE NDL ASPIAZU/BX dated [DATE]

CLINICAL DATA: Postoperative right anterior total hip joint
replacement.

EXAM:
PORTABLE PELVIS 1-2 VIEWS

[AP]
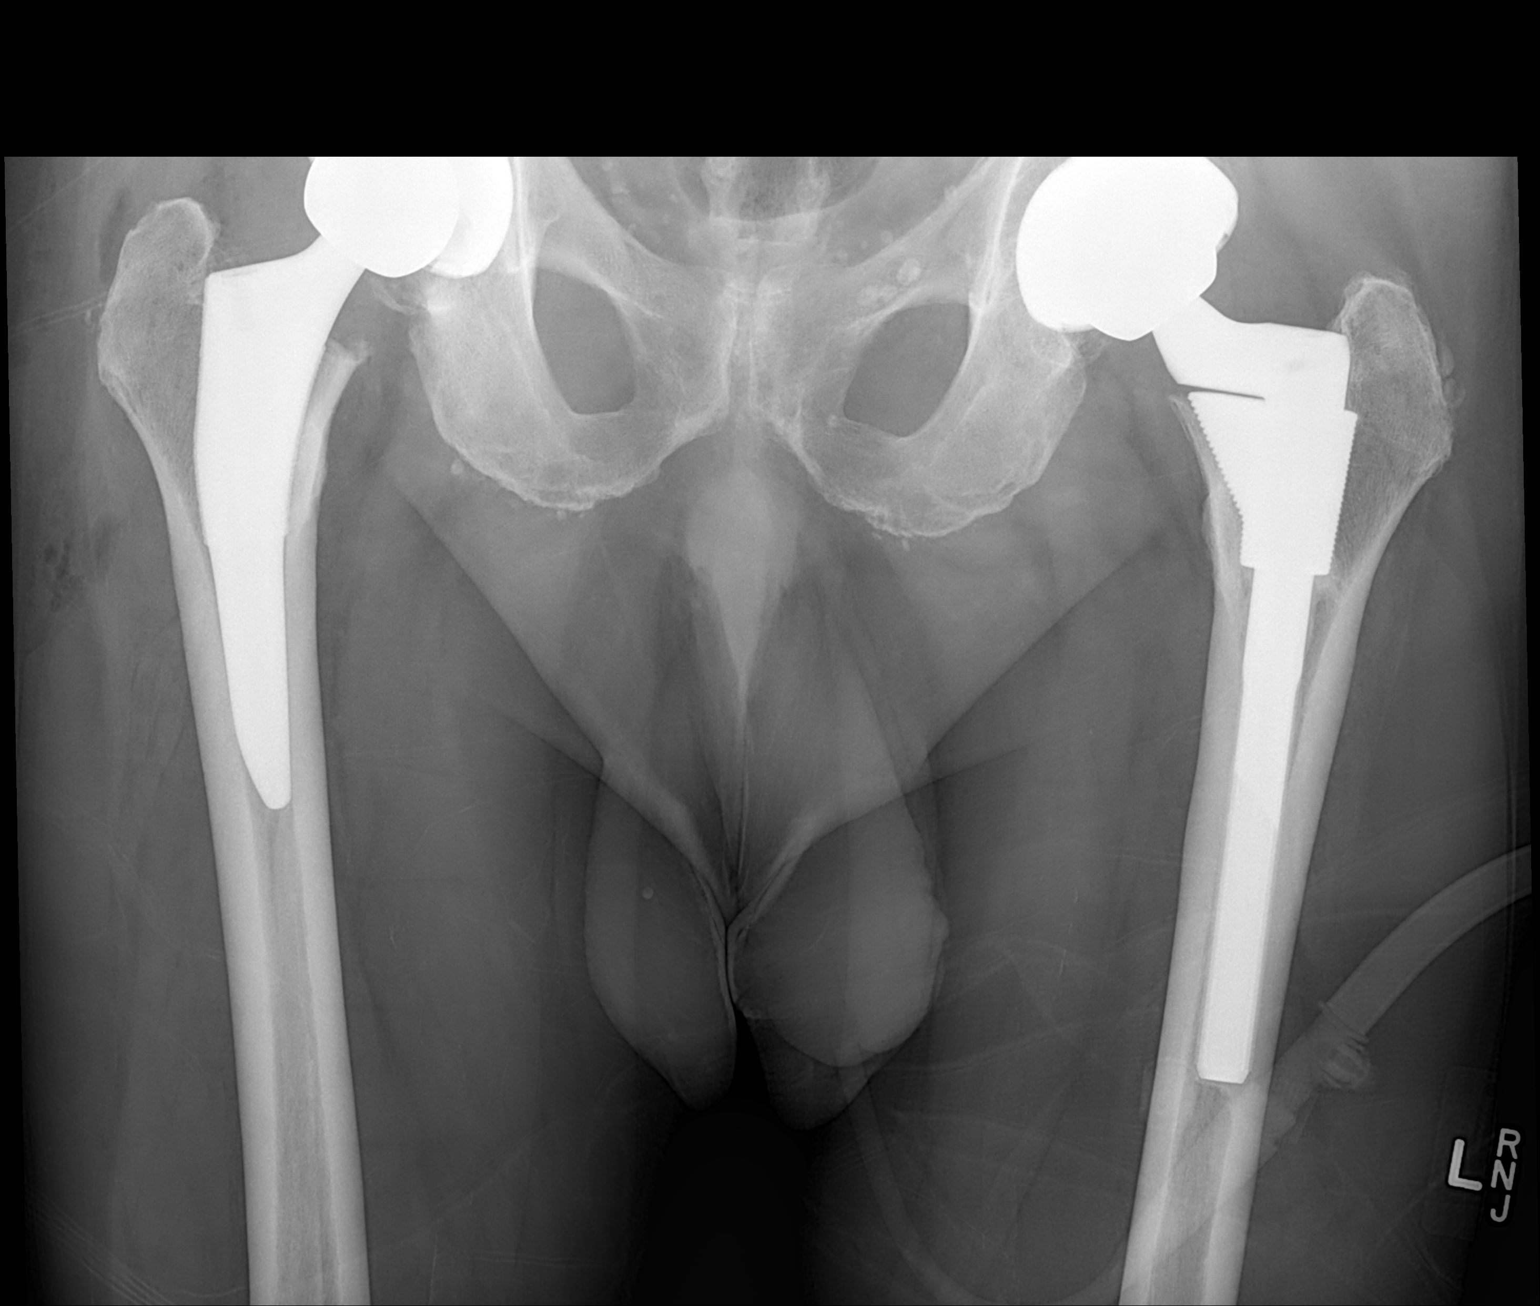

[1 of 1 positions shown; findings below may reference images not displayed]

FINDINGS: The patient has undergone right total hip joint prosthesis
placement. The prosthetic components appear appropriately
positioned. The native bone exhibits no acute abnormality.
IMPRESSION: The patient has undergone right total hip joint prosthesis placement
without immediate postprocedure complication.

## 2013-08-04 SURGERY — ARTHROPLASTY, HIP, TOTAL, ANTERIOR APPROACH
Anesthesia: Spinal | Site: Hip | Laterality: Right

## 2013-08-04 MED ORDER — HYDROMORPHONE HCL PF 1 MG/ML IJ SOLN
0.5000 mg | INTRAMUSCULAR | Status: DC | PRN
  Administered 2013-08-04: 1 mg via INTRAVENOUS
  Administered 2013-08-04: 0.5 mg via INTRAVENOUS
  Administered 2013-08-05: 1 mg via INTRAVENOUS
  Filled 2013-08-04 (×3): qty 1

## 2013-08-04 MED ORDER — ACETAMINOPHEN 500 MG PO TABS
500.0000 mg | ORAL_TABLET | Freq: Four times a day (QID) | ORAL | Status: DC | PRN
  Administered 2013-08-04 – 2013-08-05 (×4): 1000 mg via ORAL
  Filled 2013-08-04 (×4): qty 2

## 2013-08-04 MED ORDER — FENTANYL CITRATE 0.05 MG/ML IJ SOLN
INTRAMUSCULAR | Status: AC
  Filled 2013-08-04: qty 5

## 2013-08-04 MED ORDER — EPHEDRINE SULFATE 50 MG/ML IJ SOLN
INTRAMUSCULAR | Status: DC | PRN
  Administered 2013-08-04: 10 mg via INTRAVENOUS

## 2013-08-04 MED ORDER — DIPHENHYDRAMINE HCL 25 MG PO CAPS
25.0000 mg | ORAL_CAPSULE | Freq: Four times a day (QID) | ORAL | Status: DC | PRN
  Administered 2013-08-05: 25 mg via ORAL
  Filled 2013-08-04: qty 1

## 2013-08-04 MED ORDER — POLYVINYL ALCOHOL 1.4 % OP SOLN
1.0000 [drp] | Freq: Three times a day (TID) | OPHTHALMIC | Status: DC | PRN
  Filled 2013-08-04: qty 15

## 2013-08-04 MED ORDER — PROPOFOL INFUSION 10 MG/ML OPTIME
INTRAVENOUS | Status: DC | PRN
  Administered 2013-08-04: 100 ug/kg/min via INTRAVENOUS

## 2013-08-04 MED ORDER — CHLORHEXIDINE GLUCONATE 4 % EX LIQD: 60.0000 mL | Freq: Once | CUTANEOUS | Status: DC

## 2013-08-04 MED ORDER — HYDROMORPHONE HCL PF 1 MG/ML IJ SOLN
0.2500 mg | INTRAMUSCULAR | Status: DC | PRN
  Administered 2013-08-04 (×2): 0.5 mg via INTRAVENOUS

## 2013-08-04 MED ORDER — MAGNESIUM CITRATE PO SOLN: 1.0000 | Freq: Once | ORAL | Status: AC | PRN

## 2013-08-04 MED ORDER — MIDAZOLAM HCL 2 MG/2ML IJ SOLN
INTRAMUSCULAR | Status: AC
  Filled 2013-08-04: qty 2

## 2013-08-04 MED ORDER — HYDROMORPHONE HCL PF 1 MG/ML IJ SOLN
INTRAMUSCULAR | Status: AC
  Filled 2013-08-04: qty 1

## 2013-08-04 MED ORDER — POTASSIUM CITRATE ER 10 MEQ (1080 MG) PO TBCR
10.0000 meq | EXTENDED_RELEASE_TABLET | Freq: Three times a day (TID) | ORAL | Status: DC
  Administered 2013-08-05 (×2): 10 meq via ORAL
  Filled 2013-08-04 (×4): qty 1

## 2013-08-04 MED ORDER — ONDANSETRON HCL 4 MG/2ML IJ SOLN: 4.0000 mg | Freq: Four times a day (QID) | INTRAMUSCULAR | Status: DC | PRN

## 2013-08-04 MED ORDER — DEXAMETHASONE SODIUM PHOSPHATE 10 MG/ML IJ SOLN
10.0000 mg | Freq: Once | INTRAMUSCULAR | Status: AC
  Administered 2013-08-04: 10 mg via INTRAVENOUS

## 2013-08-04 MED ORDER — LACTATED RINGERS IV SOLN: INTRAVENOUS | Status: DC

## 2013-08-04 MED ORDER — FERROUS SULFATE 325 (65 FE) MG PO TABS
325.0000 mg | ORAL_TABLET | Freq: Three times a day (TID) | ORAL | Status: DC
  Administered 2013-08-04 – 2013-08-05 (×3): 325 mg via ORAL
  Filled 2013-08-04 (×5): qty 1

## 2013-08-04 MED ORDER — ASPIRIN EC 325 MG PO TBEC
325.0000 mg | DELAYED_RELEASE_TABLET | Freq: Two times a day (BID) | ORAL | Status: DC
  Administered 2013-08-05: 325 mg via ORAL
  Filled 2013-08-04 (×3): qty 1

## 2013-08-04 MED ORDER — ONDANSETRON HCL 4 MG PO TABS: 4.0000 mg | ORAL_TABLET | Freq: Four times a day (QID) | ORAL | Status: DC | PRN

## 2013-08-04 MED ORDER — PROPOFOL 10 MG/ML IV BOLUS
INTRAVENOUS | Status: AC
  Filled 2013-08-04: qty 20

## 2013-08-04 MED ORDER — POLYETHYLENE GLYCOL 3350 17 G PO PACK
17.0000 g | PACK | Freq: Two times a day (BID) | ORAL | Status: DC
  Administered 2013-08-04 – 2013-08-05 (×2): 17 g via ORAL

## 2013-08-04 MED ORDER — ACETAMINOPHEN 10 MG/ML IV SOLN
1000.0000 mg | Freq: Once | INTRAVENOUS | Status: AC
  Administered 2013-08-04: 1000 mg via INTRAVENOUS
  Filled 2013-08-04: qty 100

## 2013-08-04 MED ORDER — CEFAZOLIN SODIUM-DEXTROSE 2-3 GM-% IV SOLR
INTRAVENOUS | Status: AC
  Filled 2013-08-04: qty 50

## 2013-08-04 MED ORDER — METHOCARBAMOL 500 MG PO TABS
500.0000 mg | ORAL_TABLET | Freq: Four times a day (QID) | ORAL | Status: DC | PRN
  Administered 2013-08-04 – 2013-08-05 (×3): 500 mg via ORAL
  Filled 2013-08-04 (×3): qty 1

## 2013-08-04 MED ORDER — BISACODYL 10 MG RE SUPP: 10.0000 mg | Freq: Every day | RECTAL | Status: DC | PRN

## 2013-08-04 MED ORDER — HYDROCHLOROTHIAZIDE 12.5 MG PO CAPS
12.5000 mg | ORAL_CAPSULE | Freq: Every day | ORAL | Status: DC
  Administered 2013-08-05: 12.5 mg via ORAL
  Filled 2013-08-04: qty 1

## 2013-08-04 MED ORDER — GLYCOPYRROLATE 0.2 MG/ML IJ SOLN
INTRAMUSCULAR | Status: DC | PRN
  Administered 2013-08-04: .2 mg via INTRAVENOUS

## 2013-08-04 MED ORDER — PHENOL 1.4 % MT LIQD
1.0000 | OROMUCOSAL | Status: DC | PRN
  Filled 2013-08-04: qty 177

## 2013-08-04 MED ORDER — TELMISARTAN-HCTZ 80-12.5 MG PO TABS: 1.0000 | ORAL_TABLET | Freq: Every morning | ORAL | Status: DC

## 2013-08-04 MED ORDER — ATROPINE SULFATE 0.4 MG/ML IJ SOLN
INTRAMUSCULAR | Status: DC | PRN
  Administered 2013-08-04: 0.4 mg via INTRAVENOUS

## 2013-08-04 MED ORDER — SODIUM CHLORIDE 0.9 % IV SOLN
100.0000 mL/h | INTRAVENOUS | Status: DC
  Administered 2013-08-04 – 2013-08-05 (×2): 100 mL/h via INTRAVENOUS
  Filled 2013-08-04 (×5): qty 1000

## 2013-08-04 MED ORDER — BUPIVACAINE HCL (PF) 0.75 % IJ SOLN
INTRAMUSCULAR | Status: DC | PRN
  Administered 2013-08-04: 15 mg via INTRATHECAL

## 2013-08-04 MED ORDER — MIDAZOLAM HCL 5 MG/5ML IJ SOLN
INTRAMUSCULAR | Status: DC | PRN
  Administered 2013-08-04: 1 mg via INTRAVENOUS

## 2013-08-04 MED ORDER — CEFAZOLIN SODIUM-DEXTROSE 2-3 GM-% IV SOLR
2.0000 g | INTRAVENOUS | Status: AC
  Administered 2013-08-04: 2 g via INTRAVENOUS

## 2013-08-04 MED ORDER — PHENYLEPHRINE HCL 10 MG/ML IJ SOLN
INTRAMUSCULAR | Status: DC | PRN
  Administered 2013-08-04: 120 ug via INTRAVENOUS
  Administered 2013-08-04: 160 ug via INTRAVENOUS
  Administered 2013-08-04: 200 ug via INTRAVENOUS
  Administered 2013-08-04: 160 ug via INTRAVENOUS
  Administered 2013-08-04: 80 ug via INTRAVENOUS
  Administered 2013-08-04: 200 ug via INTRAVENOUS

## 2013-08-04 MED ORDER — PHENYLEPHRINE HCL 10 MG/ML IJ SOLN
10.0000 mg | INTRAVENOUS | Status: DC | PRN
  Administered 2013-08-04: 30 ug/min via INTRAVENOUS
  Administered 2013-08-04: 50 ug/min via INTRAVENOUS

## 2013-08-04 MED ORDER — FENTANYL CITRATE 0.05 MG/ML IJ SOLN
INTRAMUSCULAR | Status: DC | PRN
  Administered 2013-08-04 (×2): 100 ug via INTRAVENOUS
  Administered 2013-08-04: 50 ug via INTRAVENOUS

## 2013-08-04 MED ORDER — DEXAMETHASONE SODIUM PHOSPHATE 10 MG/ML IJ SOLN
10.0000 mg | Freq: Once | INTRAMUSCULAR | Status: AC
  Administered 2013-08-05: 10 mg via INTRAVENOUS
  Filled 2013-08-04: qty 1

## 2013-08-04 MED ORDER — 0.9 % SODIUM CHLORIDE (POUR BTL) OPTIME
TOPICAL | Status: DC | PRN
  Administered 2013-08-04: 1000 mL

## 2013-08-04 MED ORDER — LACTATED RINGERS IV SOLN
INTRAVENOUS | Status: DC | PRN
  Administered 2013-08-04 (×4): via INTRAVENOUS

## 2013-08-04 MED ORDER — METHOCARBAMOL 100 MG/ML IJ SOLN
500.0000 mg | Freq: Four times a day (QID) | INTRAVENOUS | Status: DC | PRN
  Administered 2013-08-04: 500 mg via INTRAVENOUS
  Filled 2013-08-04: qty 5

## 2013-08-04 MED ORDER — CEFAZOLIN SODIUM-DEXTROSE 2-3 GM-% IV SOLR
2.0000 g | Freq: Four times a day (QID) | INTRAVENOUS | Status: AC
  Administered 2013-08-04 (×2): 2 g via INTRAVENOUS
  Filled 2013-08-04 (×2): qty 50

## 2013-08-04 MED ORDER — TRANEXAMIC ACID 100 MG/ML IV SOLN
1000.0000 mg | Freq: Once | INTRAVENOUS | Status: AC
  Administered 2013-08-04: 1000 mg via INTRAVENOUS
  Filled 2013-08-04: qty 10

## 2013-08-04 MED ORDER — FENTANYL CITRATE 0.05 MG/ML IJ SOLN
INTRAMUSCULAR | Status: AC
  Filled 2013-08-04: qty 2

## 2013-08-04 MED ORDER — METOPROLOL SUCCINATE ER 50 MG PO TB24
50.0000 mg | ORAL_TABLET | Freq: Every morning | ORAL | Status: DC
  Administered 2013-08-05: 50 mg via ORAL
  Filled 2013-08-04: qty 1

## 2013-08-04 MED ORDER — CELECOXIB 200 MG PO CAPS
200.0000 mg | ORAL_CAPSULE | Freq: Two times a day (BID) | ORAL | Status: DC
  Administered 2013-08-04 – 2013-08-05 (×3): 200 mg via ORAL
  Filled 2013-08-04 (×4): qty 1

## 2013-08-04 MED ORDER — MENTHOL 3 MG MT LOZG
1.0000 | LOZENGE | OROMUCOSAL | Status: DC | PRN
  Filled 2013-08-04: qty 9

## 2013-08-04 MED ORDER — METOCLOPRAMIDE HCL 10 MG PO TABS: 5.0000 mg | ORAL_TABLET | Freq: Three times a day (TID) | ORAL | Status: DC | PRN

## 2013-08-04 MED ORDER — ALUM & MAG HYDROXIDE-SIMETH 200-200-20 MG/5ML PO SUSP: 30.0000 mL | ORAL | Status: DC | PRN

## 2013-08-04 MED ORDER — PANTOPRAZOLE SODIUM 40 MG PO TBEC
40.0000 mg | DELAYED_RELEASE_TABLET | Freq: Every day | ORAL | Status: DC
  Administered 2013-08-05: 40 mg via ORAL
  Filled 2013-08-04: qty 1

## 2013-08-04 MED ORDER — METOCLOPRAMIDE HCL 5 MG/ML IJ SOLN: 5.0000 mg | Freq: Three times a day (TID) | INTRAMUSCULAR | Status: DC | PRN

## 2013-08-04 MED ORDER — DOCUSATE SODIUM 100 MG PO CAPS
100.0000 mg | ORAL_CAPSULE | Freq: Two times a day (BID) | ORAL | Status: DC
  Administered 2013-08-04 – 2013-08-05 (×2): 100 mg via ORAL

## 2013-08-04 MED ORDER — IRBESARTAN 300 MG PO TABS
300.0000 mg | ORAL_TABLET | Freq: Every day | ORAL | Status: DC
  Administered 2013-08-05: 300 mg via ORAL
  Filled 2013-08-04: qty 1

## 2013-08-04 SURGICAL SUPPLY — 36 items
ADH SKN CLS APL DERMABOND .7 (GAUZE/BANDAGES/DRESSINGS) ×1
BAG SPEC THK2 15X12 ZIP CLS (MISCELLANEOUS)
BAG ZIPLOCK 12X15 (MISCELLANEOUS) IMPLANT
CAPT HIP PF MOP ×2 IMPLANT
DERMABOND ADVANCED (GAUZE/BANDAGES/DRESSINGS) ×2
DERMABOND ADVANCED .7 DNX12 (GAUZE/BANDAGES/DRESSINGS) ×1 IMPLANT
DRAPE C-ARM 42X120 X-RAY (DRAPES) ×3 IMPLANT
DRAPE STERI IOBAN 125X83 (DRAPES) ×3 IMPLANT
DRAPE U-SHAPE 47X51 STRL (DRAPES) ×9 IMPLANT
DRSG AQUACEL AG ADV 3.5X10 (GAUZE/BANDAGES/DRESSINGS) ×3 IMPLANT
DURAPREP 26ML APPLICATOR (WOUND CARE) ×3 IMPLANT
ELECT BLADE TIP CTD 4 INCH (ELECTRODE) ×3 IMPLANT
ELECT REM PT RETURN 9FT ADLT (ELECTROSURGICAL) ×3
ELECTRODE REM PT RTRN 9FT ADLT (ELECTROSURGICAL) ×1 IMPLANT
FACESHIELD LNG OPTICON STERILE (SAFETY) ×12 IMPLANT
GLOVE BIOGEL PI IND STRL 7.5 (GLOVE) ×1 IMPLANT
GLOVE BIOGEL PI IND STRL 8 (GLOVE) ×1 IMPLANT
GLOVE BIOGEL PI INDICATOR 7.5 (GLOVE) ×2
GLOVE BIOGEL PI INDICATOR 8 (GLOVE) ×2
GLOVE ECLIPSE 8.0 STRL XLNG CF (GLOVE) ×3 IMPLANT
GLOVE ORTHO TXT STRL SZ7.5 (GLOVE) ×6 IMPLANT
GOWN SPEC L3 XXLG W/TWL (GOWN DISPOSABLE) ×3 IMPLANT
GOWN STRL REUS W/TWL LRG LVL3 (GOWN DISPOSABLE) ×5 IMPLANT
GOWN STRL REUS W/TWL XL LVL3 (GOWN DISPOSABLE) ×4 IMPLANT
KIT BASIN OR (CUSTOM PROCEDURE TRAY) ×3 IMPLANT
PACK TOTAL JOINT (CUSTOM PROCEDURE TRAY) ×3 IMPLANT
PADDING CAST COTTON 6X4 STRL (CAST SUPPLIES) ×3 IMPLANT
SAW OSC TIP CART 19.5X105X1.3 (SAW) ×2 IMPLANT
SUT MNCRL AB 4-0 PS2 18 (SUTURE) ×3 IMPLANT
SUT VIC AB 1 CT1 36 (SUTURE) ×9 IMPLANT
SUT VIC AB 2-0 CT1 27 (SUTURE) ×6
SUT VIC AB 2-0 CT1 TAPERPNT 27 (SUTURE) ×2 IMPLANT
SUT VLOC 180 0 24IN GS25 (SUTURE) ×3 IMPLANT
TOWEL OR 17X26 10 PK STRL BLUE (TOWEL DISPOSABLE) ×3 IMPLANT
TOWEL OR NON WOVEN STRL DISP B (DISPOSABLE) ×2 IMPLANT
TRAY FOLEY METER SIL LF 16FR (CATHETERS) ×2 IMPLANT

## 2013-08-04 NOTE — Anesthesia Postprocedure Evaluation (Signed)
  Anesthesia Post-op Note  Patient: [name] [name]  Procedure(s) Performed: Procedure(s) (LRB): RIGHT TOTAL HIP ARTHROPLASTY ANTERIOR APPROACH (Right)  Patient Location: PACU  Anesthesia Type: Spinal  Level of Consciousness: awake and alert   Airway and Oxygen Therapy: Patient Spontanous Breathing  Post-op Pain: mild  Post-op Assessment: Post-op Vital signs reviewed, Patient's Cardiovascular Status Stable, Respiratory Function Stable, Patent Airway and No signs of Nausea or vomiting  Last Vitals:  Filed Vitals:   08/04/13 1000  BP: 105/67  Pulse: 92  Temp: 37.1 C  Resp: 16    Post-op Vital Signs: stable   Complications: No apparent anesthesia complications

## 2013-08-04 NOTE — Evaluation (Signed)
Physical Therapy Evaluation Patient Details Name: [name] [name] MRN: [id number] DOB: 05/30/1935 Today's Date: 08/04/2013 Time: 7253-6644 PT Time Calculation (min): 32 min  PT Assessment / Plan / Recommendation History of Present Illness     Clinical Impression  Pt s/p R THR presents with decreased R LE strength/ROM and post op pain limiting functional mobility.  Pt should progress to d/c home with family assist and HHPT follow up    PT Assessment  Patient needs continued PT services    Follow Up Recommendations  Home health PT    Does the patient have the potential to tolerate intense rehabilitation      Barriers to Discharge        Equipment Recommendations  None recommended by PT    Recommendations for Other Services OT consult   Frequency 7X/week    Precautions / Restrictions Precautions Precautions: Fall Restrictions Weight Bearing Restrictions: No   Pertinent Vitals/Pain 3/10; premed, ice pack provided      Mobility  Bed Mobility Overal bed mobility: Needs Assistance Bed Mobility: Supine to Sit;Sit to Supine Supine to sit: Mod assist Sit to supine: Mod assist General bed mobility comments: cues for sequence and use of L LE to self assist Transfers Overall transfer level: Needs assistance Equipment used: Rolling walker (2 wheeled) Transfers: Sit to/from Stand Sit to Stand: Min assist;Mod assist General transfer comment: cues for LE management and use of UEs to self assist Ambulation/Gait Ambulation/Gait assistance: Min assist;Mod assist Ambulation Distance (Feet): 40 Feet Assistive device: Rolling walker (2 wheeled) Gait Pattern/deviations: Step-to pattern;Decreased step length - right;Decreased step length - left;Shuffle;Trunk flexed Gait velocity: decr General Gait Details: cues for sequence, posture and position from RW    Exercises Total Joint Exercises Ankle Circles/Pumps: AROM;10 reps;Supine;Both Quad Sets: AROM;Both;10 reps;Supine Heel Slides:  AAROM;Both;Supine;20 reps Hip ABduction/ADduction: AAROM;Right;15 reps;Supine   PT Diagnosis: Difficulty walking  PT Problem List: Decreased strength;Decreased range of motion;Decreased activity tolerance;Decreased mobility;Decreased knowledge of use of DME;Pain PT Treatment Interventions: DME instruction;Gait training;Stair training;Functional mobility training;Therapeutic activities;Therapeutic exercise;Patient/family education     PT Goals(Current goals can be found in the care plan section) Acute Rehab PT Goals Patient Stated Goal: Resume previous lifestyle with decreased pain PT Goal Formulation: With patient Time For Goal Achievement: 08/10/13 Potential to Achieve Goals: Good  Visit Information  Last PT Received On: 08/04/13 Assistance Needed: +1       Prior [place] expects to be discharged to:: Private residence Living Arrangements: Alone Available Help at Discharge: Family Type of Home: House Home Access: Stairs to enter Technical brewer of Steps: 4 Entrance Stairs-Rails: Right Home Layout: One level Home Equipment: Walker - 2 wheels;Crutches;Cane - single point Prior Function Level of Independence: Independent;Independent with assistive device(s) Communication Communication: No difficulties Dominant Hand: Right    Cognition  Cognition Arousal/Alertness: Awake/alert Behavior During Therapy: WFL for tasks assessed/performed Overall Cognitive Status: Within Functional Limits for tasks assessed    Extremity/Trunk Assessment Upper Extremity Assessment Upper Extremity Assessment: Overall WFL for tasks assessed Lower Extremity Assessment Lower Extremity Assessment: RLE deficits/detail RLE Deficits / Details: Hip strength 2/5 with AAROM at hip to 90 flex and 15 abd Cervical / Trunk Assessment Cervical / Trunk Assessment: Normal   Balance    End of Session PT - End of Session Equipment Utilized During Treatment: Gait  belt Activity Tolerance: Patient tolerated treatment well Patient left: in chair;with call bell/phone within reach Nurse Communication: Mobility status  GP     [name] [name] 08/04/2013, 5:54  PM   

## 2013-08-04 NOTE — Interval H&P Note (Signed)
History and Physical Interval Note:  08/04/2013 7:22 AM  [name] [name]  has presented today for surgery, with the diagnosis of right hip osteoarthritis  The various methods of treatment have been discussed with the patient and family. After consideration of risks, benefits and other options for treatment, the patient has consented to  Procedure(s): RIGHT TOTAL HIP ARTHROPLASTY ANTERIOR APPROACH (Right) as a surgical intervention .  The patient's history has been reviewed, patient examined, no change in status, stable for surgery.  I have reviewed the patient's chart and labs.  Questions were answered to the patient's satisfaction.     Mauri Pole

## 2013-08-04 NOTE — Anesthesia Procedure Notes (Signed)
Spinal  Patient location during procedure: OR Start time: 08/04/2013 7:35 AM End time: 08/04/2013 7:40 AM Staffing Anesthesiologist: Rod Mae L Performed by: anesthesiologist  Preanesthetic Checklist Completed: patient identified, site marked, surgical consent, pre-op evaluation, timeout performed, IV checked, risks and benefits discussed and monitors and equipment checked Spinal Block Patient position: sitting Prep: Betadine Patient monitoring: heart rate, continuous pulse ox and blood pressure Approach: midline Location: L3-4 Injection technique: single-shot Needle Needle type: Spinocan  Needle gauge: 22 G Needle length: 9 cm Assessment Sensory level: T6 Additional Notes Expiration date of kit checked and confirmed. Patient tolerated procedure well, without complications.

## 2013-08-04 NOTE — Transfer of Care (Signed)
Immediate Anesthesia Transfer of Care Note  Patient: [name] [name]  Procedure(s) Performed: Procedure(s): RIGHT TOTAL HIP ARTHROPLASTY ANTERIOR APPROACH (Right)  Patient Location: PACU  Anesthesia Type:Spinal  Level of Consciousness: awake, alert , oriented, patient cooperative and responds to stimulation  Airway & Oxygen Therapy: Patient Spontanous Breathing and Patient connected to face mask oxygen  Post-op Assessment: Report given to PACU RN, Post -op Vital signs reviewed and stable and Patient moving all extremities X 4  Post vital signs: stable  Complications: No apparent anesthesia complications spinal level S1 level

## 2013-08-04 NOTE — Op Note (Signed)
NAME:  [name] [name]                ACCOUNT NO.: [id number]      MEDICAL RECORD NO.: [id number]      FACILITY:  [facility]      PHYSICIAN:  Paralee Cancel D  DATE OF BIRTH:  04-13-1936     DATE OF PROCEDURE:  08/04/2013                                 OPERATIVE REPORT         PREOPERATIVE DIAGNOSIS: Right  hip osteoarthritis.      POSTOPERATIVE DIAGNOSIS:  Right hip osteoarthritis.      PROCEDURE:  Right total hip replacement through an anterior approach   utilizing DePuy THR system, component size 53mm pinnacle cup, a size 36+4 neutral   Altrex liner, a size 6 Hi Tri Lock stem with a 36+1.5 Articuleze metal ceramic   ball.      SURGEON:  Pietro Cassis. Alvan Dame, M.D.      ASSISTANT:  Danae Orleans, PA-C     ANESTHESIA:  Spinal.      SPECIMENS:  None.      COMPLICATIONS:  None.      BLOOD LOSS:  800 cc     DRAINS:  None     INDICATION OF THE PROCEDURE:  [name] [name] is a 78 y.o. male who had   presented to office for evaluation of right hip pain.  Radiographs revealed   progressive degenerative changes with bone-on-bone   articulation to the  hip joint.  The patient had painful limited range of   motion significantly affecting their overall quality of life.  The patient was failing to    respond to conservative measures, and at this point was ready   to proceed with more definitive measures.  The patient has noted progressive   degenerative changes in his hip, progressive problems and dysfunction   with regarding the hip prior to surgery.  Consent was obtained for   benefit of pain relief.  Specific risk of infection, DVT, component   failure, dislocation, need for revision surgery, as well discussion of   the anterior versus posterior approach were reviewed.  Consent was   obtained for benefit of anterior pain relief through an anterior   approach.      PROCEDURE IN DETAIL:  The patient was brought to operative theater.   Once adequate anesthesia,  preoperative antibiotics, 2gm Ancef administered.   The patient was positioned supine on the OSI Hanna table.  Once adequate   padding of boney process was carried out, we had predraped out the hip, and  used fluoroscopy to confirm orientation of the pelvis and position.      The right hip was then prepped and draped from proximal iliac crest to   mid thigh with shower curtain technique.      Time-out was performed identifying the patient, planned procedure, and   extremity.     An incision was then made 2 cm distal and lateral to the   anterior superior iliac spine extending over the orientation of the   tensor fascia lata muscle and sharp dissection was carried down to the   fascia of the muscle and protractor placed in the soft tissues.      The fascia was then incised.  The muscle belly was identified and swept  laterally and retractor placed along the superior neck.  Following   cauterization of the circumflex vessels and removing some pericapsular   fat, a second cobra retractor was placed on the inferior neck.  A third   retractor was placed on the anterior acetabulum after elevating the   anterior rectus.  A L-capsulotomy was along the line of the   superior neck to the trochanteric fossa, then extended proximally and   distally.  Tag sutures were placed and the retractors were then placed   intracapsular.  We then identified the trochanteric fossa and   orientation of my neck cut, confirmed this radiographically   and then made a neck osteotomy with the femur on traction.  The femoral   head was removed without difficulty or complication.  Traction was let   off and retractors were placed posterior and anterior around the   acetabulum.      The labrum and foveal tissue were debrided.  I began reaming with a 65mm   reamer and reamed up to 55 reamer with good bony bed preparation and a 56   cup was chosen.  The final 76mm Pinnacle cup was then impacted under fluoroscopy  to  confirm the depth of penetration and orientation with respect to   abduction.  A screw was placed followed by the hole eliminator.  The final   36+4 neutral Altrex liner was impacted with good visualized rim fit.  The cup was positioned anatomically within the acetabular portion of the pelvis.      At this point, the femur was rolled at 80 degrees.  Further capsule was   released off the inferior aspect of the femoral neck.  I then   released the superior capsule proximally.  The hook was placed laterally   along the femur and elevated manually and held in position with the bed   hook.  The leg was then extended and adducted with the leg rolled to 100   degrees of external rotation.  Once the proximal femur was fully   exposed, I used a box osteotome to set orientation.  I then began   broaching with the starting chili pepper broach and passed this by hand and then broached up to 6.  With the 6 broach in place I chose a high offset neck and did a trial reduction.  The offset was appropriate, leg lengths   appeared to be equal, confirmed radiographically.   Given these findings, I went ahead and dislocated the hip, repositioned all   retractors and positioned the right hip in the extended and abducted position.  The final 6 Hi Tri Lock stem was   chosen and it was impacted down to the level of neck cut.  Based on this   and the trial reduction, a 36+1.5 Articuleze metal ball was chosen and   impacted onto a clean and dry trunnion, and the hip was reduced.  The   hip had been irrigated throughout the case again at this point.  I did   reapproximate the superior capsular leaflet to the anterior leaflet   using #1 Vicryl.  The fascia of the   tensor fascia lata muscle was then reapproximated using #1 Vicryl and #0 V-lock sutures.  The   remaining wound was closed with 2-0 Vicryl and running 4-0 Monocryl.   The hip was cleaned, dried, and dressed sterilely using Dermabond and   Aquacel dressing.   She was then brought   to recovery room  in stable condition tolerating the procedure well.    Danae Orleans, PA-C was present for the entirety of the case involved from   preoperative positioning, perioperative retractor management, general   facilitation of the case, as well as primary wound closure as assistant.            Pietro Cassis Alvan Dame, M.D.        08/04/2013 9:45 AM

## 2013-08-04 NOTE — Progress Notes (Signed)
Utilization review completed.  

## 2013-08-05 DIAGNOSIS — E669 Obesity, unspecified: Secondary | ICD-10-CM | POA: Diagnosis present

## 2013-08-05 LAB — BASIC METABOLIC PANEL
BUN: 20 mg/dL (ref 6–23)
CALCIUM: 8.4 mg/dL (ref 8.4–10.5)
CO2: 27 mEq/L (ref 19–32)
Chloride: 101 mEq/L (ref 96–112)
Creatinine, Ser: 1.2 mg/dL (ref 0.50–1.35)
GFR calc non Af Amer: 57 mL/min — ABNORMAL LOW (ref >90)
GFR, EST AFRICAN AMERICAN: 66 mL/min — AB (ref >90)
Glucose, Bld: 128 mg/dL — ABNORMAL HIGH (ref 70–99)
Potassium: 4.6 mEq/L (ref 3.7–5.3)
SODIUM: 136 meq/L — AB (ref 137–147)

## 2013-08-05 LAB — CBC
HCT: 33.3 % — ABNORMAL LOW (ref 39.0–52.0)
Hemoglobin: 11.5 g/dL — ABNORMAL LOW (ref 13.0–17.0)
MCH: 30.9 pg (ref 26.0–34.0)
MCHC: 34.5 g/dL (ref 30.0–36.0)
MCV: 89.5 fL (ref 78.0–100.0)
Platelets: 148 10*3/uL — ABNORMAL LOW (ref 150–400)
RBC: 3.72 MIL/uL — ABNORMAL LOW (ref 4.22–5.81)
RDW: 13.6 % (ref 11.5–15.5)
WBC: 11.3 10*3/uL — ABNORMAL HIGH (ref 4.0–10.5)

## 2013-08-05 MED ORDER — ASPIRIN 325 MG PO TBEC: 325.0000 mg | DELAYED_RELEASE_TABLET | Freq: Two times a day (BID) | ORAL | Status: AC

## 2013-08-05 MED ORDER — FERROUS SULFATE 325 (65 FE) MG PO TABS: 325.0000 mg | ORAL_TABLET | Freq: Three times a day (TID) | ORAL | Status: DC

## 2013-08-05 MED ORDER — POLYETHYLENE GLYCOL 3350 17 G PO PACK: 17.0000 g | PACK | Freq: Two times a day (BID) | ORAL | Status: DC

## 2013-08-05 MED ORDER — DSS 100 MG PO CAPS: 100.0000 mg | ORAL_CAPSULE | Freq: Two times a day (BID) | ORAL | Status: DC

## 2013-08-05 MED ORDER — METHOCARBAMOL 500 MG PO TABS: 500.0000 mg | ORAL_TABLET | Freq: Four times a day (QID) | ORAL | Status: DC | PRN

## 2013-08-05 NOTE — Progress Notes (Signed)
[facility]  [facility] is providing the following services: Patient has both rw and commode at home. No DME needs at this time.   If patient discharges after hours, please call [phone].   Linward Headland 08/05/2013, 11:39 AM

## 2013-08-05 NOTE — Progress Notes (Signed)
Physical Therapy Treatment Patient Details Name: [name] [name] MRN: [id number] DOB: Jan 11, 1936 Today's Date: 08/05/2013 Time: 2595-6387 PT Time Calculation (min): 31 min  PT Assessment / Plan / Recommendation  History of Present Illness Pt was admitted for R DA THA   PT Comments   Improvement in activity tolerance and all areas of mobility.  Pt hoping for d./c this pm  Follow Up Recommendations  Home health PT     Does the patient have the potential to tolerate intense rehabilitation     Barriers to Discharge        Equipment Recommendations  None recommended by PT    Recommendations for Other Services OT consult  Frequency 7X/week   Progress towards PT Goals Progress towards PT goals: Progressing toward goals  Plan Current plan remains appropriate    Precautions / Restrictions Precautions Precautions: Fall Restrictions Weight Bearing Restrictions: No   Pertinent Vitals/Pain 4/10; premed, ice pack provided    Mobility  Bed Mobility Overal bed mobility: Needs Assistance Bed Mobility: Supine to Sit Supine to sit: Min assist General bed mobility comments: cues for sequence and use of L LE to self assist Transfers Overall transfer level: Needs assistance Equipment used: Rolling walker (2 wheeled) Transfers: Sit to/from Stand Sit to Stand: Min guard General transfer comment: for safety:  no cues needed today Ambulation/Gait Ambulation/Gait assistance: Min assist;Min guard Ambulation Distance (Feet): 200 Feet Assistive device: Rolling walker (2 wheeled) Gait Pattern/deviations: Step-to pattern;Decreased step length - right;Decreased step length - left;Shuffle;Trunk flexed General Gait Details: cues for sequence, posture and position from RW    Exercises Total Joint Exercises Ankle Circles/Pumps: AROM;Supine;Both;20 reps Heel Slides: AAROM;Both;Supine;20 reps Hip ABduction/ADduction: AAROM;Right;Supine;20 reps Long Arc Quad: AROM;Both;20 reps;Supine   PT Diagnosis:     PT Problem List:   PT Treatment Interventions:     PT Goals (current goals can now be found in the care plan section) Acute Rehab PT Goals Patient Stated Goal: Resume previous lifestyle with decreased pain PT Goal Formulation: With patient Time For Goal Achievement: 08/10/13 Potential to Achieve Goals: Good  Visit Information  Last PT Received On: 08/05/13 Assistance Needed: +1 History of Present Illness: Pt was admitted for R DA THA    Subjective Data  Subjective: Better than yesterday Patient Stated Goal: Resume previous lifestyle with decreased pain   Cognition  Cognition Arousal/Alertness: Awake/alert Behavior During Therapy: WFL for tasks assessed/performed Overall Cognitive Status: Within Functional Limits for tasks assessed    Balance  General Comments General comments (skin integrity, edema, etc.): Reinforced pt to call nursing for any movement:  he had asked if he needed walker to get back to bed  End of Session PT - End of Session Equipment Utilized During Treatment: Gait belt Activity Tolerance: Patient tolerated treatment well Patient left: in chair;with call bell/phone within reach Nurse Communication: Mobility status   GP     [name] [name] 08/05/2013, 12:15 PM

## 2013-08-05 NOTE — Progress Notes (Signed)
   CARE MANAGEMENT NOTE 08/05/2013  Patient:  DAEVEON, ZWEBER   Account Number:  [id number]  Date Initiated:  08/05/2013  Documentation initiated by:  [facility]  Subjective/Objective Assessment:   RIGHT TOTAL HIP ARTHROPLASTY ANTERIOR APPROACH     Action/Plan:   [place]   Anticipated DC Date:  08/06/2013   Anticipated DC Plan:  [place]  CM consult      [facility] Choice  HOME HEALTH   Choice offered to / List presented to:  C-1 Patient        [place] arranged  HH-2 PT      [place]   Status of service:  Completed, signed off Medicare Important Message given?   (If response is "NO", the following Medicare IM given date fields will be blank) Date Medicare IM given:   Date Additional Medicare IM given:    Discharge Disposition:  [place]  Per UR Regulation:    If discussed at Long Length of Stay Meetings, dates discussed:    Comments:  08/05/2013 1000 NCM spoke to pt and offered choice for [facility]. Pt agreeable to [facility] for [facility]. Pt states she has RW and 3n1 for home. Jonnie Finner RN CCM Case Mgmt phone [phone]

## 2013-08-05 NOTE — Progress Notes (Signed)
Physical Therapy Treatment Patient Details Name: [name] [name] MRN: [id number] DOB: 1936/05/02 Today's Date: 08/05/2013 Time: 1355-1416 PT Time Calculation (min): 21 min  PT Assessment / Plan / Recommendation  History of Present Illness Pt was admitted for R DA THA   PT Comments   Reviewed stairs and car transfers with pt and spouse.  Follow Up Recommendations  Home health PT     Does the patient have the potential to tolerate intense rehabilitation     Barriers to Discharge        Equipment Recommendations  None recommended by PT    Recommendations for Other Services OT consult  Frequency 7X/week   Progress towards PT Goals Progress towards PT goals: Progressing toward goals  Plan Current plan remains appropriate    Precautions / Restrictions Precautions Precautions: Fall Restrictions Weight Bearing Restrictions: No   Pertinent Vitals/Pain 3/10; premed with Tylenol     Mobility  Bed Mobility Overal bed mobility: Needs Assistance Bed Mobility: Sit to Supine Supine to sit: Supervision General bed mobility comments: cues for sequence and use of L LE to self assist Transfers Overall transfer level: Needs assistance Equipment used: Rolling walker (2 wheeled) Transfers: Sit to/from Stand Sit to Stand: Supervision General transfer comment: for safety:  no cues needed today Ambulation/Gait Ambulation/Gait assistance: Min guard;Supervision Ambulation Distance (Feet): 350 Feet Assistive device: Rolling walker (2 wheeled) Gait Pattern/deviations: Shuffle;Decreased step length - right;Decreased step length - left;Step-to pattern;Step-through pattern General Gait Details: cues for sequence, posture and position from RW Stairs: Yes Stairs assistance: Min assist Stair Management: One rail Right;Step to pattern;Forwards;With cane Number of Stairs: 4 General stair comments: min cues for sequence and foot/cane placement    Exercises     PT Diagnosis:    PT Problem List:    PT Treatment Interventions:     PT Goals (current goals can now be found in the care plan section) Acute Rehab PT Goals Patient Stated Goal: Resume previous lifestyle with decreased pain PT Goal Formulation: With patient Time For Goal Achievement: 08/10/13 Potential to Achieve Goals: Good  Visit Information  Last PT Received On: 08/05/13 Assistance Needed: +1 History of Present Illness: Pt was admitted for R DA THA    Subjective Data  Subjective: Even better than this morning Patient Stated Goal: Resume previous lifestyle with decreased pain   Cognition  Cognition Arousal/Alertness: Awake/alert Behavior During Therapy: WFL for tasks assessed/performed Overall Cognitive Status: Within Functional Limits for tasks assessed    Balance     End of Session PT - End of Session Equipment Utilized During Treatment: Gait belt Activity Tolerance: Patient tolerated treatment well Patient left: in chair;with call bell/phone within reach Nurse Communication: Mobility status   GP     [name] [name] 08/05/2013, 3:28 PM

## 2013-08-05 NOTE — Progress Notes (Signed)
   Subjective: 1 Day Post-Op Procedure(s) (LRB): RIGHT TOTAL HIP ARTHROPLASTY ANTERIOR APPROACH (Right)   Patient reports pain as mild, pain well controlled. No events throughout the night. Felt better after getting up with PT yesterday. Ready to be discharged home if he does well with PT and pain stays controlled.   Objective:   VITALS:   Filed Vitals:   08/05/13 0637  BP: 127/77  Pulse: 54  Temp: 97.7 F (36.5 C)  Resp: 16    Neurovascular intact Dorsiflexion/Plantar flexion intact Incision: dressing C/D/I No cellulitis present Compartment soft  LABS  Recent Labs  08/05/13 0358  HGB 11.5*  HCT 33.3*  WBC 11.3*  PLT 148*     Recent Labs  08/05/13 0358  NA 136*  K 4.6  BUN 20  CREATININE 1.20  GLUCOSE 128*     Assessment/Plan: 1 Day Post-Op Procedure(s) (LRB): RIGHT TOTAL HIP ARTHROPLASTY ANTERIOR APPROACH (Right) Foley cath d/c'ed Advance diet Up with therapy D/C IV fluids Discharge home with home health Follow up in 2 weeks at [facility]. Follow up with OLIN,Daksha Koone D in 2 weeks.  Contact information:  [facility] [address], [place] [phone]    Expected ABLA  Treated with iron and will observe  Obese (BMI 30-39.9) Estimated body mass index is 30.64 kg/(m^2) as calculated from the following:   Height as of this encounter: 6' (1.829 m).   Weight as of this encounter: 102.513 kg (226 lb). Patient also counseled that weight may inhibit the healing process Patient counseled that losing weight will help with future health issues        [place]. Dahlia Nifong   PAC  08/05/2013, 9:06 AM

## 2013-08-06 DIAGNOSIS — Z4801 Encounter for change or removal of surgical wound dressing: Secondary | ICD-10-CM | POA: Diagnosis not present

## 2013-08-06 DIAGNOSIS — Z96649 Presence of unspecified artificial hip joint: Secondary | ICD-10-CM | POA: Diagnosis not present

## 2013-08-06 DIAGNOSIS — I129 Hypertensive chronic kidney disease with stage 1 through stage 4 chronic kidney disease, or unspecified chronic kidney disease: Secondary | ICD-10-CM | POA: Diagnosis not present

## 2013-08-06 DIAGNOSIS — Z471 Aftercare following joint replacement surgery: Secondary | ICD-10-CM | POA: Diagnosis not present

## 2013-08-06 DIAGNOSIS — N183 Chronic kidney disease, stage 3 unspecified: Secondary | ICD-10-CM | POA: Diagnosis not present

## 2013-08-06 DIAGNOSIS — IMO0001 Reserved for inherently not codable concepts without codable children: Secondary | ICD-10-CM | POA: Diagnosis not present

## 2013-08-08 DIAGNOSIS — I129 Hypertensive chronic kidney disease with stage 1 through stage 4 chronic kidney disease, or unspecified chronic kidney disease: Secondary | ICD-10-CM | POA: Diagnosis not present

## 2013-08-08 DIAGNOSIS — N183 Chronic kidney disease, stage 3 unspecified: Secondary | ICD-10-CM | POA: Diagnosis not present

## 2013-08-08 DIAGNOSIS — Z471 Aftercare following joint replacement surgery: Secondary | ICD-10-CM | POA: Diagnosis not present

## 2013-08-08 DIAGNOSIS — Z96649 Presence of unspecified artificial hip joint: Secondary | ICD-10-CM | POA: Diagnosis not present

## 2013-08-08 DIAGNOSIS — IMO0001 Reserved for inherently not codable concepts without codable children: Secondary | ICD-10-CM | POA: Diagnosis not present

## 2013-08-08 DIAGNOSIS — Z4801 Encounter for change or removal of surgical wound dressing: Secondary | ICD-10-CM | POA: Diagnosis not present

## 2013-08-09 NOTE — Discharge Summary (Signed)
Physician Discharge Summary  Patient ID: [name] [name] MRN: [id number] DOB/AGE: [birth year] 76 y.o.  Admit date: 08/04/2013 Discharge date: 08/05/2013   Procedures:  Procedure(s) (LRB): RIGHT TOTAL HIP ARTHROPLASTY ANTERIOR APPROACH (Right)  Attending Physician:  Dr. Paralee Cancel   Admission Diagnoses:   Right hip OA /pain  Discharge Diagnoses:  Principal Problem:   S/P right THA, AA Active Problems:   Acute blood loss anemia   Obese  Past Medical History  Diagnosis Date  . Hypertension   . History of paroxysmal supraventricular tachycardia     [name]  . Vertigo, benign positional     [name]  . Palpitations     History of ventricular ectopy. He is status post radiofrequency ablation at the [facility] in the early 90's. for PVC's  . BPH (benign prostatic hyperplasia)     s/p TURP in July 2012  . Aortic sclerosis     MILD PER ECHO AS WELL AS TRACE MITRAL REGURGITATION - PER CARDIOLOGY OFFICE NOTES 06/2012  . EKG abnormalities     INCOMPLETE RIGHT BUNDLE BRANCH BLOCK AND CHRONIC T-WAVE ABNORMALITIES - PER CARDIOLOGY OFFICE NOTES 06/2012  . Complication of anesthesia     HX OF MINOR ITCHING AFTER ANESTHESIA FOR HIP REPLACEMENT AND FOR COLON SURGERY  . GERD (gastroesophageal reflux disease)   . Arthritis     HPI: [name] [name], has a history of pain and functional disability in the right hip(s) due to arthritis and patient has failed non-surgical conservative treatments for greater than 12 weeks to include NSAID's and/or analgesics, corticosteriod injections, use of assistive devices and activity modification. Onset of symptoms was abrupt starting 2-3 months ago with rapidlly worsening course since that time.The patient noted no past surgery on the right hip(s). Patient currently rates pain in the right hip at 9 out of 10 with activity. Patient has worsening of pain with  activity and weight bearing, trendelenberg gait, pain that interfers with activities of daily living and pain with passive range of motion. Patient has evidence of periarticular osteophytes and joint space narrowing by imaging studies. This condition presents safety issues increasing the risk of falls. There is no current active infection. Risks, benefits and expectations were discussed with the patient. Risks including but not limited to the risk of anesthesia, blood clots, nerve damage, blood vessel damage, failure of the prosthesis, infection and up to and including death. Patient understand the risks, benefits and expectations and wishes to proceed with surgery.   PCP: Henrine Screws, MD   Discharged Condition: good  Hospital Course:  Patient underwent the above stated procedure on 08/04/2013. Patient tolerated the procedure well and brought to the recovery room in good condition and subsequently to the floor.  POD #1 BP: 127/77 ; Pulse: 54 ; Temp: 97.7 F (36.5 C) ; Resp: 16  Patient reports pain as mild, pain well controlled. No events throughout the night. Felt better after getting up with PT yesterday. Ready to be discharged home  Neurovascular intact, dorsiflexion/plantar flexion intact, incision: dressing C/D/I, no cellulitis present and compartment soft.   LABS  Basename    HGB  11.5  HCT  33.3    Discharge Exam: General appearance: alert, cooperative and no distress Extremities: Homans sign is negative, no sign of DVT, no edema, redness or tenderness in the calves or thighs and no ulcers, gangrene or trophic changes  Disposition:  Home-Health Care Svc with follow up in 2 weeks   Follow-up Information   Follow up with Mauri Pole, MD. Schedule an appointment as soon as possible for a visit in 2 weeks.   Specialty:  Orthopedic Surgery   Contact information:   [address] [place] [address] [phone]       Follow up with [facility]. [facility])    Contact information:   [address] [place] [address] [phone]       Discharge Orders   Future Orders Complete By Expires   Call MD / Call 911  As directed    Comments:     If you experience chest pain or shortness of breath, CALL 911 and be transported to the hospital emergency room.  If you develope a fever above 101 F, pus (white drainage) or increased drainage or redness at the wound, or calf pain, call your surgeon's office.   Change dressing  As directed    Comments:     Maintain surgical dressing for 10-14 days, or until follow up in the clinic.   Constipation Prevention  As directed    Comments:     Drink plenty of fluids.  Prune juice may be helpful.  You may use a stool softener, such as Colace (over the counter) 100 mg twice a day.  Use MiraLax (over the counter) for constipation as needed.   Diet - low sodium heart healthy  As directed    Discharge instructions  As directed    Comments:     Maintain surgical dressing for 10-14 days, or until follow up in the clinic. Follow up in 2 weeks at [facility]. Call with any questions or concerns.   Increase activity slowly as tolerated  As directed    TED hose  As directed    Comments:     Use stockings (TED hose) for 2 weeks on both leg(s).  You may remove them at night for sleeping.   Weight bearing as tolerated  As directed    Questions:     Laterality:     Extremity:          Medication List         aspirin 325 MG EC tablet  Take 1 tablet (325 mg total) by mouth 2 (two) times daily.     celecoxib 200 MG capsule  Commonly known as:  CELEBREX  Take 200 mg by mouth 2 (two) times daily as needed for mild pain.     DSS 100 MG Caps  Take 100 mg by mouth 2 (two) times daily.     ferrous sulfate 325 (65 FE) MG tablet  Take 1 tablet (325 mg total) by mouth 3 (three) times daily after meals.     lansoprazole 30 MG capsule  Commonly known as:   PREVACID  Take 30 mg by mouth daily as needed (heart  burn).     methocarbamol 500 MG tablet  Commonly known as:  ROBAXIN  Take 1 tablet (500 mg total) by mouth every 6 (six) hours as needed for muscle spasms.     metoprolol succinate 50 MG 24 hr tablet  Commonly known as:  TOPROL-XL  Take 50 mg by mouth every morning. Take with or immediately following a meal.     polycarbophil 625 MG tablet  Commonly known as:  FIBERCON  Take 625 mg by mouth daily as needed for mild constipation.  polyethylene glycol packet  Commonly known as:  MIRALAX / GLYCOLAX  Take 17 g by mouth 2 (two) times daily.     potassium citrate 10 MEQ (1080 MG) SR tablet  Commonly known as:  UROCIT-K  Take 10 mEq by mouth 3 (three) times daily with meals.     predniSONE 5 MG tablet  Commonly known as:  DELTASONE  Take 5 mg by mouth daily with breakfast. Pt is on a taper dose     SYSTANE BALANCE 0.6 % Soln  Generic drug:  Propylene Glycol  Place 1 drop into both eyes 3 (three) times daily as needed (dry eyes).     telmisartan-hydrochlorothiazide 80-12.5 MG per tablet  Commonly known as:  MICARDIS HCT  Take 1 tablet by mouth every morning.     TYLENOL 500 MG tablet  Generic drug:  acetaminophen  Take 500 mg by mouth every 6 (six) hours as needed. Can take one or two 3 times a day     VITAMIN D PO  Take 1,000 mg by mouth daily.         Signed: West Pugh. Ornella Coderre   PAC  08/09/2013, 9:24 AM

## 2013-08-10 DIAGNOSIS — Z471 Aftercare following joint replacement surgery: Secondary | ICD-10-CM | POA: Diagnosis not present

## 2013-08-10 DIAGNOSIS — Z96649 Presence of unspecified artificial hip joint: Secondary | ICD-10-CM | POA: Diagnosis not present

## 2013-08-10 DIAGNOSIS — I129 Hypertensive chronic kidney disease with stage 1 through stage 4 chronic kidney disease, or unspecified chronic kidney disease: Secondary | ICD-10-CM | POA: Diagnosis not present

## 2013-08-10 DIAGNOSIS — IMO0001 Reserved for inherently not codable concepts without codable children: Secondary | ICD-10-CM | POA: Diagnosis not present

## 2013-08-10 DIAGNOSIS — Z4801 Encounter for change or removal of surgical wound dressing: Secondary | ICD-10-CM | POA: Diagnosis not present

## 2013-08-10 DIAGNOSIS — N183 Chronic kidney disease, stage 3 unspecified: Secondary | ICD-10-CM | POA: Diagnosis not present

## 2013-08-12 DIAGNOSIS — N183 Chronic kidney disease, stage 3 unspecified: Secondary | ICD-10-CM | POA: Diagnosis not present

## 2013-08-12 DIAGNOSIS — I129 Hypertensive chronic kidney disease with stage 1 through stage 4 chronic kidney disease, or unspecified chronic kidney disease: Secondary | ICD-10-CM | POA: Diagnosis not present

## 2013-08-12 DIAGNOSIS — Z471 Aftercare following joint replacement surgery: Secondary | ICD-10-CM | POA: Diagnosis not present

## 2013-08-12 DIAGNOSIS — Z96649 Presence of unspecified artificial hip joint: Secondary | ICD-10-CM | POA: Diagnosis not present

## 2013-08-12 DIAGNOSIS — IMO0001 Reserved for inherently not codable concepts without codable children: Secondary | ICD-10-CM | POA: Diagnosis not present

## 2013-08-12 DIAGNOSIS — Z4801 Encounter for change or removal of surgical wound dressing: Secondary | ICD-10-CM | POA: Diagnosis not present

## 2013-08-15 DIAGNOSIS — Z4801 Encounter for change or removal of surgical wound dressing: Secondary | ICD-10-CM | POA: Diagnosis not present

## 2013-08-15 DIAGNOSIS — N183 Chronic kidney disease, stage 3 unspecified: Secondary | ICD-10-CM | POA: Diagnosis not present

## 2013-08-15 DIAGNOSIS — IMO0001 Reserved for inherently not codable concepts without codable children: Secondary | ICD-10-CM | POA: Diagnosis not present

## 2013-08-15 DIAGNOSIS — Z96649 Presence of unspecified artificial hip joint: Secondary | ICD-10-CM | POA: Diagnosis not present

## 2013-08-15 DIAGNOSIS — Z471 Aftercare following joint replacement surgery: Secondary | ICD-10-CM | POA: Diagnosis not present

## 2013-08-15 DIAGNOSIS — I129 Hypertensive chronic kidney disease with stage 1 through stage 4 chronic kidney disease, or unspecified chronic kidney disease: Secondary | ICD-10-CM | POA: Diagnosis not present

## 2013-08-16 DIAGNOSIS — I129 Hypertensive chronic kidney disease with stage 1 through stage 4 chronic kidney disease, or unspecified chronic kidney disease: Secondary | ICD-10-CM | POA: Diagnosis not present

## 2013-08-16 DIAGNOSIS — Z471 Aftercare following joint replacement surgery: Secondary | ICD-10-CM | POA: Diagnosis not present

## 2013-08-16 DIAGNOSIS — Z96649 Presence of unspecified artificial hip joint: Secondary | ICD-10-CM | POA: Diagnosis not present

## 2013-08-16 DIAGNOSIS — N183 Chronic kidney disease, stage 3 unspecified: Secondary | ICD-10-CM | POA: Diagnosis not present

## 2013-08-16 DIAGNOSIS — IMO0001 Reserved for inherently not codable concepts without codable children: Secondary | ICD-10-CM | POA: Diagnosis not present

## 2013-08-16 DIAGNOSIS — Z4801 Encounter for change or removal of surgical wound dressing: Secondary | ICD-10-CM | POA: Diagnosis not present

## 2013-08-17 DIAGNOSIS — Z4801 Encounter for change or removal of surgical wound dressing: Secondary | ICD-10-CM | POA: Diagnosis not present

## 2013-08-17 DIAGNOSIS — Z471 Aftercare following joint replacement surgery: Secondary | ICD-10-CM | POA: Diagnosis not present

## 2013-08-17 DIAGNOSIS — Z96649 Presence of unspecified artificial hip joint: Secondary | ICD-10-CM | POA: Diagnosis not present

## 2013-08-17 DIAGNOSIS — IMO0001 Reserved for inherently not codable concepts without codable children: Secondary | ICD-10-CM | POA: Diagnosis not present

## 2013-08-17 DIAGNOSIS — N183 Chronic kidney disease, stage 3 unspecified: Secondary | ICD-10-CM | POA: Diagnosis not present

## 2013-08-17 DIAGNOSIS — I129 Hypertensive chronic kidney disease with stage 1 through stage 4 chronic kidney disease, or unspecified chronic kidney disease: Secondary | ICD-10-CM | POA: Diagnosis not present

## 2013-08-23 DIAGNOSIS — Z4801 Encounter for change or removal of surgical wound dressing: Secondary | ICD-10-CM | POA: Diagnosis not present

## 2013-08-23 DIAGNOSIS — N183 Chronic kidney disease, stage 3 unspecified: Secondary | ICD-10-CM | POA: Diagnosis not present

## 2013-08-23 DIAGNOSIS — I129 Hypertensive chronic kidney disease with stage 1 through stage 4 chronic kidney disease, or unspecified chronic kidney disease: Secondary | ICD-10-CM | POA: Diagnosis not present

## 2013-08-23 DIAGNOSIS — IMO0001 Reserved for inherently not codable concepts without codable children: Secondary | ICD-10-CM | POA: Diagnosis not present

## 2013-08-23 DIAGNOSIS — Z471 Aftercare following joint replacement surgery: Secondary | ICD-10-CM | POA: Diagnosis not present

## 2013-08-23 DIAGNOSIS — Z96649 Presence of unspecified artificial hip joint: Secondary | ICD-10-CM | POA: Diagnosis not present

## 2013-08-25 DIAGNOSIS — N183 Chronic kidney disease, stage 3 unspecified: Secondary | ICD-10-CM | POA: Diagnosis not present

## 2013-08-25 DIAGNOSIS — Z4801 Encounter for change or removal of surgical wound dressing: Secondary | ICD-10-CM | POA: Diagnosis not present

## 2013-08-25 DIAGNOSIS — Z471 Aftercare following joint replacement surgery: Secondary | ICD-10-CM | POA: Diagnosis not present

## 2013-08-25 DIAGNOSIS — Z96649 Presence of unspecified artificial hip joint: Secondary | ICD-10-CM | POA: Diagnosis not present

## 2013-08-25 DIAGNOSIS — I129 Hypertensive chronic kidney disease with stage 1 through stage 4 chronic kidney disease, or unspecified chronic kidney disease: Secondary | ICD-10-CM | POA: Diagnosis not present

## 2013-08-25 DIAGNOSIS — IMO0001 Reserved for inherently not codable concepts without codable children: Secondary | ICD-10-CM | POA: Diagnosis not present

## 2013-09-05 DIAGNOSIS — R05 Cough: Secondary | ICD-10-CM | POA: Diagnosis not present

## 2013-09-05 DIAGNOSIS — R059 Cough, unspecified: Secondary | ICD-10-CM | POA: Diagnosis not present

## 2013-09-05 DIAGNOSIS — J309 Allergic rhinitis, unspecified: Secondary | ICD-10-CM | POA: Diagnosis not present

## 2013-09-05 DIAGNOSIS — K219 Gastro-esophageal reflux disease without esophagitis: Secondary | ICD-10-CM | POA: Diagnosis not present

## 2013-09-07 ENCOUNTER — Other Ambulatory Visit: Payer: Self-pay | Admitting: Cardiology

## 2013-09-16 DIAGNOSIS — Z9660 Presence of unspecified orthopedic joint implant: Secondary | ICD-10-CM | POA: Diagnosis not present

## 2013-09-19 ENCOUNTER — Other Ambulatory Visit: Payer: Self-pay | Admitting: Dermatology

## 2013-09-19 DIAGNOSIS — D1801 Hemangioma of skin and subcutaneous tissue: Secondary | ICD-10-CM | POA: Diagnosis not present

## 2013-09-19 DIAGNOSIS — L821 Other seborrheic keratosis: Secondary | ICD-10-CM | POA: Diagnosis not present

## 2013-09-19 DIAGNOSIS — Z85828 Personal history of other malignant neoplasm of skin: Secondary | ICD-10-CM | POA: Diagnosis not present

## 2013-09-19 DIAGNOSIS — C44519 Basal cell carcinoma of skin of other part of trunk: Secondary | ICD-10-CM | POA: Diagnosis not present

## 2013-09-19 DIAGNOSIS — L570 Actinic keratosis: Secondary | ICD-10-CM | POA: Diagnosis not present

## 2013-09-26 DIAGNOSIS — J309 Allergic rhinitis, unspecified: Secondary | ICD-10-CM | POA: Diagnosis not present

## 2013-09-26 DIAGNOSIS — J45909 Unspecified asthma, uncomplicated: Secondary | ICD-10-CM | POA: Diagnosis not present

## 2013-10-06 ENCOUNTER — Other Ambulatory Visit: Payer: Self-pay | Admitting: Cardiology

## 2013-11-01 DIAGNOSIS — Z0000 Encounter for general adult medical examination without abnormal findings: Secondary | ICD-10-CM | POA: Diagnosis not present

## 2013-11-01 DIAGNOSIS — E559 Vitamin D deficiency, unspecified: Secondary | ICD-10-CM | POA: Diagnosis not present

## 2013-11-01 DIAGNOSIS — D649 Anemia, unspecified: Secondary | ICD-10-CM | POA: Diagnosis not present

## 2013-11-01 DIAGNOSIS — J309 Allergic rhinitis, unspecified: Secondary | ICD-10-CM | POA: Diagnosis not present

## 2013-11-01 DIAGNOSIS — Z1331 Encounter for screening for depression: Secondary | ICD-10-CM | POA: Diagnosis not present

## 2013-11-01 DIAGNOSIS — H9319 Tinnitus, unspecified ear: Secondary | ICD-10-CM | POA: Diagnosis not present

## 2013-11-01 DIAGNOSIS — H9190 Unspecified hearing loss, unspecified ear: Secondary | ICD-10-CM | POA: Diagnosis not present

## 2013-11-01 DIAGNOSIS — Z23 Encounter for immunization: Secondary | ICD-10-CM | POA: Diagnosis not present

## 2013-11-01 DIAGNOSIS — R55 Syncope and collapse: Secondary | ICD-10-CM | POA: Diagnosis not present

## 2013-11-01 DIAGNOSIS — I10 Essential (primary) hypertension: Secondary | ICD-10-CM | POA: Diagnosis not present

## 2013-12-29 DIAGNOSIS — M76829 Posterior tibial tendinitis, unspecified leg: Secondary | ICD-10-CM | POA: Diagnosis not present

## 2013-12-29 DIAGNOSIS — M19079 Primary osteoarthritis, unspecified ankle and foot: Secondary | ICD-10-CM | POA: Diagnosis not present

## 2013-12-29 DIAGNOSIS — M25579 Pain in unspecified ankle and joints of unspecified foot: Secondary | ICD-10-CM | POA: Diagnosis not present

## 2013-12-29 DIAGNOSIS — M109 Gout, unspecified: Secondary | ICD-10-CM | POA: Diagnosis not present

## 2014-02-01 ENCOUNTER — Encounter: Payer: Self-pay | Admitting: Cardiology

## 2014-02-01 ENCOUNTER — Ambulatory Visit (INDEPENDENT_AMBULATORY_CARE_PROVIDER_SITE_OTHER): Payer: Medicare Other | Admitting: Cardiology

## 2014-02-01 VITALS — BP 108/60 | HR 64 | Ht 72.0 in | Wt 228.0 lb

## 2014-02-01 DIAGNOSIS — M1710 Unilateral primary osteoarthritis, unspecified knee: Secondary | ICD-10-CM

## 2014-02-01 DIAGNOSIS — I119 Hypertensive heart disease without heart failure: Secondary | ICD-10-CM

## 2014-02-01 DIAGNOSIS — M170 Bilateral primary osteoarthritis of knee: Secondary | ICD-10-CM

## 2014-02-01 DIAGNOSIS — I471 Supraventricular tachycardia: Secondary | ICD-10-CM | POA: Diagnosis not present

## 2014-02-01 NOTE — Patient Instructions (Signed)
Your physician recommends that you continue on your current medications as directed. Please refer to the Current Medication list given to you today.  Your physician wants you to follow-up in: 6 MONTH OV /EKG You will receive a reminder letter in the mail two months in advance. If you don't receive a letter, please call our office to schedule the follow-up appointment.  

## 2014-02-01 NOTE — Progress Notes (Signed)
[name] [name] Date of Birth:  06-03-1935 [place] [address] [place], [place]  [address] [phone]        Fax   [phone]   History of Present Illness: This pleasant 78 year old gentleman is seen for a scheduled followup office visit.  In the past he has had successful rotator cuff surgery on his right shoulder by Dr. Netta Cedars.  He underwent total right hip replacement with an anterior approach by Dr. Alvan Dame on Thursday, 08/04/2013. He has a past history of essential hypertension and a past history of paroxysmal supraventricular tachycardia. He does not have any history of ischemic heart disease. He had a cardiac catheterization in 2001 showing normal coronary arteries.  he had a normal nuclear stress test in 2008. He had an echocardiogram in 2011 which showed mild LVH with normal systolic function and with impaired relaxation. He has mild aortic sclerosis by echo as well as trace mitral regurgitation. His most recent nuclear stress test was 12/16/12 showing an ejection fraction of 59% and no ischemia or wall motion abnormalities. He does have a chronically abnormal electrocardiogram with abnormal T waves.  Since last visit he has been doing well with no new cardiac symptoms.  He is trying to get regular exercise.  Current Outpatient Prescriptions  Medication Sig Dispense Refill  . allopurinol (ZYLOPRIM) 100 MG tablet Take 100 mg by mouth daily.      Marland Kitchen aspirin 81 MG tablet Take 81 mg by mouth daily.      . celecoxib (CELEBREX) 200 MG capsule Take 200 mg by mouth 2 (two) times daily as needed for mild pain.      . Cholecalciferol (VITAMIN D PO) Take 1,000 mg by mouth daily.       . lansoprazole (PREVACID) 30 MG capsule Take 30 mg by mouth daily as needed (heart  burn).      . metoprolol succinate (TOPROL-XL) 50 MG 24 hr tablet TAKE ONE TABLET EACH DAY WITH OR IMMEDIATELY FOLLOWING A MEAL  90 tablet  2  . polycarbophil (FIBERCON) 625 MG tablet Take 625 mg by  mouth daily as needed for mild constipation.      . potassium citrate (UROCIT-K) 10 MEQ (1080 MG) SR tablet Take 10 mEq by mouth 3 (three) times daily with meals.      . Propylene Glycol (SYSTANE BALANCE) 0.6 % SOLN Place 1 drop into both eyes 3 (three) times daily as needed (dry eyes).      Marland Kitchen telmisartan-hydrochlorothiazide (MICARDIS HCT) 80-12.5 MG per tablet TAKE ONE TABLET EACH DAY  90 tablet  1   No current facility-administered medications for this visit.    Allergies  Allergen Reactions  . Hydrocodone-Acetaminophen Other (See Comments)    Confusion  . Ramipril     Cough    Patient Active Problem List   Diagnosis Date Noted  . Obese 08/05/2013  . S/P right THA, AA 08/04/2013  . Dyspnea on exertion 12/14/2012  . Unspecified arthropathy, lower leg 10/19/2012  . Secondary renovascular hypertension, benign 10/19/2012  . Acute blood loss anemia 10/05/2012  . Unspecified constipation 10/05/2012  . GERD (gastroesophageal reflux disease) 10/05/2012  . Syncope and collapse 09/18/2012  . Hypotension 09/18/2012  . Confusion 09/18/2012  . CKD (chronic kidney disease), stage III 09/18/2012  . Dehydration 09/18/2012  . Expected blood loss anemia 09/15/2012  . Overweight (BMI 25.0-29.9) 09/15/2012  . S/P bilateral TKA 09/13/2012  . Benign hypertensive heart disease without heart failure 07/15/2012  .  PSVT (paroxysmal supraventricular tachycardia) 07/15/2012  . Osteoarthritis 06/24/2011  . Dizziness 01/07/2011  . HTN (hypertension) 01/07/2011  . SVT (supraventricular tachycardia) 01/07/2011    History  Smoking status  . Never Smoker   Smokeless tobacco  . Not on file    History  Alcohol Use  . Yes    Comment: Drink Wine almost daily    Family History  Problem Relation Age of Onset  . Other Neg Hx   . Heart attack Mother     Review of Systems: Constitutional: no fever chills diaphoresis or fatigue or change in weight.  Head and neck: no hearing loss, no epistaxis,  no photophobia or visual disturbance. Respiratory: No cough, shortness of breath or wheezing. Cardiovascular: No chest pain peripheral edema, palpitations. Gastrointestinal: No abdominal distention, no abdominal pain, no change in bowel habits hematochezia or melena. Genitourinary: No dysuria, no frequency, no urgency, no nocturia. Musculoskeletal:No arthralgias, no back pain, no gait disturbance or myalgias. Neurological: No dizziness, no headaches, no numbness, no seizures, no syncope, no weakness, no tremors. Hematologic: No lymphadenopathy, no easy bruising. Psychiatric: No confusion, no hallucinations, no sleep disturbance.    Physical Exam: Filed Vitals:   02/01/14 1439  BP: 108/60  Pulse: 64  The patient appears to be in no distress.  Head and neck exam reveals that the pupils are equal and reactive.  The extraocular movements are full.  There is no scleral icterus.  Mouth and pharynx are benign.  No lymphadenopathy.  No carotid bruits.  The jugular venous pressure is normal.  Thyroid is not enlarged or tender.  Chest is clear to percussion and auscultation.  No rales or rhonchi.  Expansion of the chest is symmetrical.  Heart reveals no abnormal lift or heave.  First and second heart sounds are normal.  There is no murmur gallop rub or click.  The abdomen is soft and nontender.  Bowel sounds are normoactive.  There is no hepatosplenomegaly or mass.  There are no abdominal bruits.  Extremities reveal no phlebitis or edema.  Pedal pulses are good.  There is no cyanosis or clubbing.  Neurologic exam is normal strength and no lateralizing weakness.  No sensory deficits.  Integument reveals no rash  EKG shows normal sinus rhythm with incomplete right bundle branch block and left axis deviation and nonspecific ST-T changes unchanged from 07/27/13   Assessment / Plan: 1 essential hypertension without heart failure 2.  History of paroxysmal supraventricular tachycardia 3.   GERD 4. osteoarthritis of knees status post knee surgery  Disposition: Continue same medication.  Try Tylenol in place of Celebrex. Recheck in 6 months for followup office visit and EKG

## 2014-02-01 NOTE — Assessment & Plan Note (Signed)
Blood pressure has been remaining stable on current therapy. 

## 2014-02-01 NOTE — Assessment & Plan Note (Signed)
He has not had any further episodes of paroxysmal tachycardia.

## 2014-02-01 NOTE — Assessment & Plan Note (Signed)
He has arthritis of the knees.  He has been taking Celebrex everyday.  We talked about possible cardiac risks of long-term Celebrex.  He will try to use Tylenol instead

## 2014-02-24 ENCOUNTER — Other Ambulatory Visit: Payer: Self-pay | Admitting: Cardiology

## 2014-03-13 DIAGNOSIS — Z23 Encounter for immunization: Secondary | ICD-10-CM | POA: Diagnosis not present

## 2014-03-22 DIAGNOSIS — Z85828 Personal history of other malignant neoplasm of skin: Secondary | ICD-10-CM | POA: Diagnosis not present

## 2014-03-22 DIAGNOSIS — L814 Other melanin hyperpigmentation: Secondary | ICD-10-CM | POA: Diagnosis not present

## 2014-03-22 DIAGNOSIS — D225 Melanocytic nevi of trunk: Secondary | ICD-10-CM | POA: Diagnosis not present

## 2014-03-22 DIAGNOSIS — L570 Actinic keratosis: Secondary | ICD-10-CM | POA: Diagnosis not present

## 2014-03-22 DIAGNOSIS — L821 Other seborrheic keratosis: Secondary | ICD-10-CM | POA: Diagnosis not present

## 2014-03-22 DIAGNOSIS — L910 Hypertrophic scar: Secondary | ICD-10-CM | POA: Diagnosis not present

## 2014-03-22 DIAGNOSIS — D1801 Hemangioma of skin and subcutaneous tissue: Secondary | ICD-10-CM | POA: Diagnosis not present

## 2014-03-29 ENCOUNTER — Telehealth: Payer: Self-pay | Admitting: Cardiology

## 2014-03-29 NOTE — Telephone Encounter (Signed)
New problem   Pt need to clear a change to a generic medication. Please call pt

## 2014-03-29 NOTE — Telephone Encounter (Signed)
Agree okay try the generic micardis.

## 2014-03-29 NOTE — Telephone Encounter (Signed)
Spoke with patient and insurance wanting him to change to generic Micardis. Advised  Dr. Mare Ferrari does not usually have problems with patients changing. Monitor blood pressure after changing and call back if any changes.

## 2014-04-20 DIAGNOSIS — H11153 Pinguecula, bilateral: Secondary | ICD-10-CM | POA: Diagnosis not present

## 2014-06-12 DIAGNOSIS — M2140 Flat foot [pes planus] (acquired), unspecified foot: Secondary | ICD-10-CM | POA: Diagnosis not present

## 2014-06-12 DIAGNOSIS — M25579 Pain in unspecified ankle and joints of unspecified foot: Secondary | ICD-10-CM | POA: Diagnosis not present

## 2014-06-12 DIAGNOSIS — M19071 Primary osteoarthritis, right ankle and foot: Secondary | ICD-10-CM | POA: Diagnosis not present

## 2014-06-20 DIAGNOSIS — M2140 Flat foot [pes planus] (acquired), unspecified foot: Secondary | ICD-10-CM | POA: Diagnosis not present

## 2014-06-20 DIAGNOSIS — M25579 Pain in unspecified ankle and joints of unspecified foot: Secondary | ICD-10-CM | POA: Diagnosis not present

## 2014-06-22 DIAGNOSIS — M25511 Pain in right shoulder: Secondary | ICD-10-CM | POA: Diagnosis not present

## 2014-06-22 DIAGNOSIS — Z4789 Encounter for other orthopedic aftercare: Secondary | ICD-10-CM | POA: Diagnosis not present

## 2014-06-26 ENCOUNTER — Other Ambulatory Visit: Payer: Self-pay | Admitting: Cardiology

## 2014-06-27 ENCOUNTER — Ambulatory Visit: Payer: Medicare Other | Admitting: Cardiology

## 2014-06-28 ENCOUNTER — Ambulatory Visit: Payer: Medicare Other | Admitting: Cardiology

## 2014-06-28 DIAGNOSIS — N200 Calculus of kidney: Secondary | ICD-10-CM | POA: Diagnosis not present

## 2014-06-29 DIAGNOSIS — M25579 Pain in unspecified ankle and joints of unspecified foot: Secondary | ICD-10-CM | POA: Diagnosis not present

## 2014-06-29 DIAGNOSIS — M2140 Flat foot [pes planus] (acquired), unspecified foot: Secondary | ICD-10-CM | POA: Diagnosis not present

## 2014-07-04 DIAGNOSIS — M25511 Pain in right shoulder: Secondary | ICD-10-CM | POA: Diagnosis not present

## 2014-07-04 DIAGNOSIS — Z4789 Encounter for other orthopedic aftercare: Secondary | ICD-10-CM | POA: Diagnosis not present

## 2014-07-06 DIAGNOSIS — M25511 Pain in right shoulder: Secondary | ICD-10-CM | POA: Diagnosis not present

## 2014-07-07 ENCOUNTER — Encounter: Payer: Self-pay | Admitting: Cardiology

## 2014-07-07 ENCOUNTER — Ambulatory Visit (INDEPENDENT_AMBULATORY_CARE_PROVIDER_SITE_OTHER): Payer: Medicare Other | Admitting: Cardiology

## 2014-07-07 VITALS — BP 116/64 | HR 67 | Ht 72.0 in | Wt 226.0 lb

## 2014-07-07 DIAGNOSIS — I119 Hypertensive heart disease without heart failure: Secondary | ICD-10-CM | POA: Diagnosis not present

## 2014-07-07 DIAGNOSIS — I471 Supraventricular tachycardia: Secondary | ICD-10-CM

## 2014-07-07 LAB — CBC WITH DIFFERENTIAL/PLATELET
BASOS PCT: 0 % (ref 0–1)
Basophils Absolute: 0 10*3/uL (ref 0.0–0.1)
Eosinophils Absolute: 0 10*3/uL (ref 0.0–0.7)
Eosinophils Relative: 0 % (ref 0–5)
HEMATOCRIT: 45.7 % (ref 39.0–52.0)
Hemoglobin: 15.6 g/dL (ref 13.0–17.0)
Lymphocytes Relative: 15 % (ref 12–46)
Lymphs Abs: 1.6 10*3/uL (ref 0.7–4.0)
MCH: 30.9 pg (ref 26.0–34.0)
MCHC: 34.1 g/dL (ref 30.0–36.0)
MCV: 90.5 fL (ref 78.0–100.0)
MONO ABS: 1 10*3/uL (ref 0.1–1.0)
MPV: 9.8 fL (ref 8.6–12.4)
Monocytes Relative: 9 % (ref 3–12)
NEUTROS ABS: 8.1 10*3/uL — AB (ref 1.7–7.7)
Neutrophils Relative %: 76 % (ref 43–77)
PLATELETS: 196 10*3/uL (ref 150–400)
RBC: 5.05 MIL/uL (ref 4.22–5.81)
RDW: 14 % (ref 11.5–15.5)
WBC: 10.6 10*3/uL — AB (ref 4.0–10.5)

## 2014-07-07 LAB — BASIC METABOLIC PANEL
BUN: 35 mg/dL — ABNORMAL HIGH (ref 6–23)
CALCIUM: 9.4 mg/dL (ref 8.4–10.5)
CO2: 24 mEq/L (ref 19–32)
Chloride: 104 mEq/L (ref 96–112)
Creat: 1.38 mg/dL — ABNORMAL HIGH (ref 0.50–1.35)
Glucose, Bld: 101 mg/dL — ABNORMAL HIGH (ref 70–99)
POTASSIUM: 4.4 meq/L (ref 3.5–5.3)
SODIUM: 138 meq/L (ref 135–145)

## 2014-07-07 NOTE — Patient Instructions (Addendum)
Will obtain labs today and call you with the results (cbc/bmet)  Your physician recommends that you schedule a follow-up appointment in: Mid March ov/ekg

## 2014-07-07 NOTE — Progress Notes (Signed)
Cardiology Office Note   Date:  07/07/2014   ID:  [name] [name], DOB Jun 20, 1935, MRN [id number]  PCP:  Henrine Screws, MD  Cardiologist:   Darlin Coco, MD   No chief complaint on file.     History of Present Illness: [name] [name] is a 79 y.o. male who presents for follow-up office visit.  This pleasant 79 year old gentleman is seen for a scheduled followup office visit. In the past he has had successful rotator cuff surgery on his right shoulder by Dr. Netta Cedars. He underwent total right hip replacement with an anterior approach by Dr. Alvan Dame on Thursday, 08/04/2013. He has a past history of essential hypertension and a past history of paroxysmal supraventricular tachycardia. He does not have any history of ischemic heart disease. He had a cardiac catheterization in 2001 showing normal coronary arteries. he had a normal nuclear stress test in 2008. He had an echocardiogram in 2011 which showed mild LVH with normal systolic function and with impaired relaxation. He has mild aortic sclerosis by echo as well as trace mitral regurgitation. His most recent nuclear stress test was 12/16/12 showing an ejection fraction of 59% and no ischemia or wall motion abnormalities. He does have a chronically abnormal electrocardiogram with abnormal T waves. Since last visit he has been having occasional episodes of lightheadedness.  He has noted an irregular pulse at times.  Several days ago he drank more alcohol than usual and the next day he had an episode of regular tachycardia at a rate of 180.  He went home and took an metoprolol tartrate 50 mg tablet and the tachycardia resolved.  He has been trying to avoid caffeine.  He does have a cup of hot chocolate in the morning.  He has not been having any ischemic chest pain. He has a past history of having had ablation for frequent PVCs done at the [facility] clinic in the early 1990s.  Past Medical History  Diagnosis Date  . Hypertension   .  History of paroxysmal supraventricular tachycardia     DR. Georgetta Crafton IS PT'S CARDIOLOGIST  . Vertigo, benign positional     [name]  . Palpitations     History of ventricular ectopy. He is status post radiofrequency ablation at the [facility] in the early 90's. for PVC's  . BPH (benign prostatic hyperplasia)     s/p TURP in July 2012  . Aortic sclerosis     MILD PER ECHO AS WELL AS TRACE MITRAL REGURGITATION - PER CARDIOLOGY OFFICE NOTES 06/2012  . EKG abnormalities     INCOMPLETE RIGHT BUNDLE BRANCH BLOCK AND CHRONIC T-WAVE ABNORMALITIES - PER CARDIOLOGY OFFICE NOTES 06/2012  . Complication of anesthesia     HX OF MINOR ITCHING AFTER ANESTHESIA FOR HIP REPLACEMENT AND FOR COLON SURGERY  . GERD (gastroesophageal reflux disease)   . Arthritis     Past Surgical History  Procedure Laterality Date  . Hernia repair  1990  . Ankle surgery    . Tonsillectomy  Age 89  . Kidney stone surgery    . US echocardiography  06-20-2009    Est EF 55-60%  . Cardiac catheterization  2002    NORMAL CORONARY ARTERIES  . Joint replacement  ? 2009    LEFT HIP REPLACEMENT  . Colon surgery  ? 1994  . Transurethral resection of prostate    . Total knee arthroplasty Bilateral 09/13/2012  Procedure: TOTAL KNEE BILATERAL;  Surgeon: Mauri Pole, MD;  Location: [facility];  Service: Orthopedics;  Laterality: Bilateral;  . Right shoulder rotator cuff repair    . Total hip arthroplasty Right 08/04/2013    Procedure: RIGHT TOTAL HIP ARTHROPLASTY ANTERIOR APPROACH;  Surgeon: Mauri Pole, MD;  Location: [facility];  Service: Orthopedics;  Laterality: Right;     Current Outpatient Prescriptions  Medication Sig Dispense Refill  . allopurinol (ZYLOPRIM) 100 MG tablet Take 100 mg by mouth daily.    Marland Kitchen aspirin 81 MG tablet Take 81 mg by mouth daily.    . celecoxib (CELEBREX) 200 MG capsule Take 200 mg by mouth 2 (two) times daily as needed for mild pain.      . Cholecalciferol (VITAMIN D PO) Take 1,000 mg by mouth daily.     . lansoprazole (PREVACID) 30 MG capsule Take 30 mg by mouth daily as needed (heart  burn).    . metoprolol (LOPRESSOR) 50 MG tablet Take 50 mg by mouth as needed (for SVT).    Marland Kitchen metoprolol succinate (TOPROL-XL) 50 MG 24 hr tablet TAKE ONE TABLET EACH DAY WITH OR IMMEDIATELY FOLLOWING A MEAL 90 tablet 3  . polycarbophil (FIBERCON) 625 MG tablet Take 625 mg by mouth daily as needed for mild constipation.    . potassium citrate (UROCIT-K) 10 MEQ (1080 MG) SR tablet Take 10 mEq by mouth 3 (three) times daily with meals.    . Propylene Glycol (SYSTANE BALANCE) 0.6 % SOLN Place 1 drop into both eyes 3 (three) times daily as needed (dry eyes).    Marland Kitchen telmisartan-hydrochlorothiazide (MICARDIS HCT) 80-12.5 MG per tablet TAKE ONE TABLET EVERY DAY 90 tablet 3   No current facility-administered medications for this visit.    Allergies:   Hydrocodone-acetaminophen and Ramipril    Social History:  The patient  reports that he has never smoked. He does not have any smokeless tobacco history on file. He reports that he drinks alcohol. He reports that he does not use illicit drugs.   Family History:  The patient's family history includes Heart attack in his mother. There is no history of Other.    ROS:  Please see the history of present illness.   Otherwise, review of systems are positive for none.   All other systems are reviewed and negative.    PHYSICAL EXAM: VS:  BP 116/64 mmHg  Pulse 67  Ht 6' (1.829 m)  Wt 226 lb (102.513 kg)  BMI 30.64 kg/m2 , BMI Body mass index is 30.64 kg/(m^2). GEN: Well nourished, well developed, in no acute distress HEENT: normal Neck: no JVD, carotid bruits, or masses Cardiac: RRR; no murmurs, rubs, or gallops,no edema  Respiratory:  clear to auscultation bilaterally, normal work of breathing GI: soft, nontender, nondistended, + BS MS: no deformity or atrophy Skin: warm and dry, no rash Neuro:   Strength and sensation are intact Psych: euthymic mood, full affect   EKG:  EKG is ordered today. The ekg ordered today demonstrates normal sinus rhythm with PACs in atrial bigeminy.  Chronic T-wave abnormalities in inferolateral leads unchanged from prior tracings.   Recent Labs: 08/05/2013: BUN 20; Creatinine 1.20; Hemoglobin 11.5*; Platelets 148*; Potassium 4.6; Sodium 136*    Lipid Panel No results found for: CHOL, TRIG, HDL, CHOLHDL, VLDL, LDLCALC, LDLDIRECT    Wt Readings from Last 3 Encounters:  07/07/14 226 lb (102.513 kg)  02/01/14 228 lb (103.42 kg)  08/04/13 226 lb (102.513 kg)  ASSESSMENT AND PLAN:  1 essential hypertension without heart failure 2. History of paroxysmal supraventricular tachycardia 3. GERD 4. osteoarthritis of knees status post knee surgery 5.  Increased palpitations recently.  He thinks that drinking alcohol may be a triggering factor for him.  He will cut way back on alcohol.  We are checking a CBC and a basal metabolic panel today.  He will continue current medication.  He will be leaving for [place] for 2 weeks and we will plan to see him back in mid March.  At that point we will repeat his EKG and consider him for a 30 day event monitor to document his rhythm better.  He expressed an interest in having SVT ablation if this is found.   Current medicines are reviewed at length with the patient today.  The patient does not have concerns regarding medicines.  The following changes have been made:  no change  Labs/ tests ordered today include: CBC and basal metabolic panel   Orders Placed This Encounter  Procedures  . CBC with Differential/Platelet  . Basic metabolic panel  . EKG 12-Lead     Disposition:   FU with Dr. Mare Ferrari in 3 weeks office visit and EKG   Signed, Darlin Coco, MD  07/07/2014 6:38 PM    [facility] [place], [place], [place]  [address] Phone: [phone]; Fax: [phone]

## 2014-07-20 ENCOUNTER — Encounter: Payer: Self-pay | Admitting: Cardiology

## 2014-07-28 DIAGNOSIS — M25579 Pain in unspecified ankle and joints of unspecified foot: Secondary | ICD-10-CM | POA: Diagnosis not present

## 2014-07-28 DIAGNOSIS — M2140 Flat foot [pes planus] (acquired), unspecified foot: Secondary | ICD-10-CM | POA: Diagnosis not present

## 2014-08-01 ENCOUNTER — Encounter: Payer: Self-pay | Admitting: Cardiology

## 2014-08-01 ENCOUNTER — Ambulatory Visit (INDEPENDENT_AMBULATORY_CARE_PROVIDER_SITE_OTHER): Payer: Medicare Other | Admitting: Cardiology

## 2014-08-01 VITALS — BP 106/62 | HR 64 | Ht 72.0 in | Wt 223.4 lb

## 2014-08-01 DIAGNOSIS — I471 Supraventricular tachycardia: Secondary | ICD-10-CM

## 2014-08-01 DIAGNOSIS — R42 Dizziness and giddiness: Secondary | ICD-10-CM

## 2014-08-01 DIAGNOSIS — I491 Atrial premature depolarization: Secondary | ICD-10-CM

## 2014-08-01 DIAGNOSIS — I119 Hypertensive heart disease without heart failure: Secondary | ICD-10-CM | POA: Diagnosis not present

## 2014-08-01 MED ORDER — LOSARTAN POTASSIUM 25 MG PO TABS: 25.0000 mg | ORAL_TABLET | Freq: Every day | ORAL | Status: DC

## 2014-08-01 MED ORDER — METOPROLOL SUCCINATE ER 50 MG PO TB24: ORAL_TABLET | ORAL | Status: DC

## 2014-08-01 NOTE — Patient Instructions (Signed)
STOP MICARDIS   START LOSARTAN 25 MG DAILY  INCREASE TOPROL (METOPROLOL) TO 50 MG IN THE MORNING AND 1/2 IN THE EVENING   Your physician has recommended that you wear an event monitor. Event monitors are medical devices that record the heart's electrical activity. Doctors most often [place] these monitors to diagnose arrhythmias. Arrhythmias are problems with the speed or rhythm of the heartbeat. The monitor is a small, portable device. You can wear one while you do your normal daily activities. This is usually used to diagnose what is causing palpitations/syncope (passing out). GET LAB (BMET DAY YOU RETURN FOR MONITOR)  Your physician recommends that you schedule a follow-up appointment in: about 5 weeks after monitor placed

## 2014-08-01 NOTE — Progress Notes (Signed)
Cardiology Office Note   Date:  08/01/2014   ID:  [name], DOB 12-May-1936, MRN [id number]  PCP:  Henrine Screws, MD  Cardiologist:   Darlin Coco, MD   No chief complaint on file.     History of Present Illness: [name] [name] is a 79 y.o. male who presents for follow-up office visit  This pleasant 79 year old gentleman is seen for a scheduled followup office visit. In the past he has had successful rotator cuff surgery on his right shoulder by Dr. Netta Cedars. He underwent total right hip replacement with an anterior approach by Dr. Alvan Dame on Thursday, 08/04/2013. He has a past history of essential hypertension and a past history of paroxysmal supraventricular tachycardia. He does not have any history of ischemic heart disease. He had a cardiac catheterization in 2001 showing normal coronary arteries. he had a normal nuclear stress test in 2008. He had an echocardiogram in 2011 which showed mild LVH with normal systolic function and with impaired relaxation. He has mild aortic sclerosis by echo as well as trace mitral regurgitation. His most recent nuclear stress test was 12/16/12 showing an ejection fraction of 59% and no ischemia or wall motion abnormalities. He does have a chronically abnormal electrocardiogram with abnormal T waves. Since last visit he has been having occasional episodes of lightheadedness. He has noted an irregular pulse at times. Several days ago he drank more alcohol than usual and the next day he had an episode of regular tachycardia at a rate of 180. He went home and took an metoprolol tartrate 50 mg tablet and the tachycardia resolved. He has been trying to avoid caffeine. He does have a cup of hot chocolate in the morning. He has not been having any ischemic chest pain. He has a past history of having had ablation for frequent PVCs done at the [facility] clinic in the early 1990s. Since we last saw him he has had another episode of sudden rapid  tachycardia.  He thinks that his heart rate was about 200/m.  He was playing golf that time.  He drove himself home, took Lopressor and within about 30 minutes his heart was back to normal. Patient has been feeling woozy and lightheaded frequently.  His blood pressure has been running low.  His recent BMET suggested dehydration with an elevated serum creatinine.  Past Medical History  Diagnosis Date  . Hypertension   . History of paroxysmal supraventricular tachycardia     DR. Pebble Botkin IS PT'S CARDIOLOGIST  . Vertigo, benign positional     [name]  . Palpitations     History of ventricular ectopy. He is status post radiofrequency ablation at the [facility] in the early 90's. for PVC's  . BPH (benign prostatic hyperplasia)     s/p TURP in July 2012  . Aortic sclerosis     MILD PER ECHO AS WELL AS TRACE MITRAL REGURGITATION - PER CARDIOLOGY OFFICE NOTES 06/2012  . EKG abnormalities     INCOMPLETE RIGHT BUNDLE BRANCH BLOCK AND CHRONIC T-WAVE ABNORMALITIES - PER CARDIOLOGY OFFICE NOTES 06/2012  . Complication of anesthesia     HX OF MINOR ITCHING AFTER ANESTHESIA FOR HIP REPLACEMENT AND FOR COLON SURGERY  . GERD (gastroesophageal reflux disease)   . Arthritis     Past Surgical History  Procedure Laterality Date  . Hernia repair  1990  . Ankle surgery    . Tonsillectomy  Age  8  . Kidney stone surgery    . US echocardiography  06-20-2009    Est EF 55-60%  . Cardiac catheterization  2002    NORMAL CORONARY ARTERIES  . Joint replacement  ? 2009    LEFT HIP REPLACEMENT  . Colon surgery  ? 1994  . Transurethral resection of prostate    . Total knee arthroplasty Bilateral 09/13/2012    Procedure: TOTAL KNEE BILATERAL;  Surgeon: Mauri Pole, MD;  Location: [facility];  Service: Orthopedics;  Laterality: Bilateral;  . Right shoulder rotator cuff repair    . Total hip arthroplasty Right 08/04/2013    Procedure: RIGHT TOTAL HIP  ARTHROPLASTY ANTERIOR APPROACH;  Surgeon: Mauri Pole, MD;  Location: [facility];  Service: Orthopedics;  Laterality: Right;     Current Outpatient Prescriptions  Medication Sig Dispense Refill  . aspirin 81 MG tablet Take 81 mg by mouth daily.    . celecoxib (CELEBREX) 200 MG capsule Take 200 mg by mouth 2 (two) times daily as needed for mild pain.    . Cholecalciferol (VITAMIN D PO) Take 1,000 mg by mouth daily.     . lansoprazole (PREVACID) 30 MG capsule Take 30 mg by mouth daily as needed (heart  burn).    . metoprolol (LOPRESSOR) 50 MG tablet Take 50 mg by mouth as needed (for SVT).    Marland Kitchen metoprolol succinate (TOPROL-XL) 50 MG 24 hr tablet Take 1 tablet in the morning and 1/2 tablet in the evening 135 tablet 3  . potassium citrate (UROCIT-K) 10 MEQ (1080 MG) SR tablet Take 10 mEq by mouth 3 (three) times daily with meals.    . Propylene Glycol (SYSTANE BALANCE) 0.6 % SOLN Place 1 drop into both eyes 3 (three) times daily as needed (dry eyes).    Marland Kitchen losartan (COZAAR) 25 MG tablet Take 1 tablet (25 mg total) by mouth daily. 30 tablet 5  . polycarbophil (FIBERCON) 625 MG tablet Take 625 mg by mouth daily as needed for mild constipation.     No current facility-administered medications for this visit.    Allergies:   Hydrocodone-acetaminophen and Ramipril    Social History:  The patient  reports that he has never smoked. He does not have any smokeless tobacco history on file. He reports that he drinks alcohol. He reports that he does not use illicit drugs.   Family History:  The patient's family history includes Heart attack in his mother. There is no history of Other.    ROS:  Please see the history of present illness.   Otherwise, review of systems are positive for none.   All other systems are reviewed and negative.    PHYSICAL EXAM: VS:  BP 106/62 mmHg  Pulse 64  Ht 6' (1.829 m)  Wt 223 lb 6.4 oz (101.334 kg)  BMI 30.29 kg/m2  SpO2 93% , BMI Body mass index is 30.29  kg/(m^2). GEN: Well nourished, well developed, in no acute distress HEENT: normal Neck: no JVD, carotid bruits, or masses Cardiac: RRR; frequent PACs.  no murmurs, rubs, or gallops,no edema  Respiratory:  clear to auscultation bilaterally, normal work of breathing GI: soft, nontender, nondistended, + BS MS: no deformity or atrophy Skin: warm and dry, no rash Neuro:  Strength and sensation are intact Psych: euthymic mood, full affect   EKG:  EKG is ordered today. The ekg ordered today demonstrates normal sinus rhythm with PACs   Recent Labs: 07/07/2014: BUN 35*; Creatinine 1.38*; Hemoglobin 15.6; Platelets 196;  Potassium 4.4; Sodium 138    Lipid Panel No results found for: CHOL, TRIG, HDL, CHOLHDL, VLDL, LDLCALC, LDLDIRECT    Wt Readings from Last 3 Encounters:  08/01/14 223 lb 6.4 oz (101.334 kg)  07/07/14 226 lb (102.513 kg)  02/01/14 228 lb (103.42 kg)         ASSESSMENT AND PLAN:  1 essential hypertension without heart failure 2. History of paroxysmal supraventricular tachycardia 3. GERD 4. osteoarthritis of knees status post knee surgery 5. Increased palpitations recently. He thinks that drinking alcohol may be a triggering factor for him. He has cut way back on alcohol.  Because of his low blood pressure we are going to stop his Micardis HCT.  In its place we will use losartan 25 mg one daily. We are going to increase his beta blocker Toprol XL 50 to a full tablet in the morning and a half tablet at supper. He will return next week for a follow-up basal metabolic panel.  He will also return next week on March 24 for a 4 week event monitor.  He was reminded to drink plenty of water in view of his recent elevated BUN.      Orders Placed This Encounter  Procedures  . Basic metabolic panel  . Cardiac event monitor  . EKG 12-Lead     Return in about 5 or 6 weeks for follow-up office visit.  At that point we will have the results of his 4 week event  monitor at hand.  Signed, Darlin Coco, MD  08/01/2014 5:55 PM    [facility] [place], [place], [place]  [address] Phone: [phone]; Fax: [phone]

## 2014-08-10 ENCOUNTER — Encounter: Payer: Self-pay | Admitting: Radiology

## 2014-08-10 ENCOUNTER — Encounter (INDEPENDENT_AMBULATORY_CARE_PROVIDER_SITE_OTHER): Payer: Medicare Other

## 2014-08-10 ENCOUNTER — Other Ambulatory Visit (INDEPENDENT_AMBULATORY_CARE_PROVIDER_SITE_OTHER): Payer: Medicare Other | Admitting: *Deleted

## 2014-08-10 DIAGNOSIS — R42 Dizziness and giddiness: Secondary | ICD-10-CM

## 2014-08-10 DIAGNOSIS — M2140 Flat foot [pes planus] (acquired), unspecified foot: Secondary | ICD-10-CM | POA: Diagnosis not present

## 2014-08-10 DIAGNOSIS — I471 Supraventricular tachycardia: Secondary | ICD-10-CM | POA: Diagnosis not present

## 2014-08-10 DIAGNOSIS — M19071 Primary osteoarthritis, right ankle and foot: Secondary | ICD-10-CM | POA: Diagnosis not present

## 2014-08-10 DIAGNOSIS — I119 Hypertensive heart disease without heart failure: Secondary | ICD-10-CM | POA: Diagnosis not present

## 2014-08-10 DIAGNOSIS — I491 Atrial premature depolarization: Secondary | ICD-10-CM

## 2014-08-10 DIAGNOSIS — M25579 Pain in unspecified ankle and joints of unspecified foot: Secondary | ICD-10-CM | POA: Diagnosis not present

## 2014-08-10 LAB — BASIC METABOLIC PANEL
BUN: 22 mg/dL (ref 6–23)
CALCIUM: 9.1 mg/dL (ref 8.4–10.5)
CO2: 26 mEq/L (ref 19–32)
Chloride: 105 mEq/L (ref 96–112)
Creatinine, Ser: 1.28 mg/dL (ref 0.40–1.50)
GFR: 57.97 mL/min — AB (ref >60.00)
Glucose, Bld: 111 mg/dL — ABNORMAL HIGH (ref 70–99)
Potassium: 4 mEq/L (ref 3.5–5.1)
SODIUM: 136 meq/L (ref 135–145)

## 2014-08-10 NOTE — Progress Notes (Signed)
Patient ID: [name] [name], male   DOB: 1936/02/01, 79 y.o.   MRN: [id number] [employer] 30 day monitor applied. EOS 4.23-16

## 2014-08-10 NOTE — Progress Notes (Signed)
Quick Note:  Please report to patient. The recent labs are stable. Continue same medication and careful diet. ______ 

## 2014-08-17 DIAGNOSIS — M25579 Pain in unspecified ankle and joints of unspecified foot: Secondary | ICD-10-CM | POA: Diagnosis not present

## 2014-08-17 DIAGNOSIS — M2140 Flat foot [pes planus] (acquired), unspecified foot: Secondary | ICD-10-CM | POA: Diagnosis not present

## 2014-08-17 DIAGNOSIS — M19071 Primary osteoarthritis, right ankle and foot: Secondary | ICD-10-CM | POA: Diagnosis not present

## 2014-08-30 DIAGNOSIS — Z01812 Encounter for preprocedural laboratory examination: Secondary | ICD-10-CM | POA: Diagnosis not present

## 2014-08-30 DIAGNOSIS — M25871 Other specified joint disorders, right ankle and foot: Secondary | ICD-10-CM | POA: Diagnosis not present

## 2014-08-30 DIAGNOSIS — I10 Essential (primary) hypertension: Secondary | ICD-10-CM | POA: Diagnosis not present

## 2014-09-07 DIAGNOSIS — M25571 Pain in right ankle and joints of right foot: Secondary | ICD-10-CM | POA: Diagnosis not present

## 2014-09-11 DIAGNOSIS — S86211D Strain of muscle(s) and tendon(s) of anterior muscle group at lower leg level, right leg, subsequent encounter: Secondary | ICD-10-CM | POA: Diagnosis not present

## 2014-09-12 ENCOUNTER — Other Ambulatory Visit: Payer: Self-pay

## 2014-09-12 ENCOUNTER — Telehealth: Payer: Self-pay | Admitting: Cardiology

## 2014-09-12 MED ORDER — LOSARTAN POTASSIUM 25 MG PO TABS: 25.0000 mg | ORAL_TABLET | Freq: Every day | ORAL | Status: DC

## 2014-09-12 NOTE — Telephone Encounter (Signed)
New Message        Pt calling stating he wants to speak to [facility] about surgical clearance form that was faxed from Dr. Jenny Reichmann Hewitt's office. Please call back and advise.

## 2014-09-12 NOTE — Telephone Encounter (Signed)
Cleared for surgery  Left message

## 2014-09-13 ENCOUNTER — Telehealth: Payer: Self-pay | Admitting: Cardiology

## 2014-09-13 NOTE — Telephone Encounter (Signed)
Spoke with [name] [name] at [facility]. Informed her that Dr. Mare Ferrari and his nurse are out of the office but that I would route this information to Dr. Mare Ferrari for review and advisement. Will forward to Dr. Mare Ferrari and his nurse for review, advisement and follow up.

## 2014-09-13 NOTE — Telephone Encounter (Signed)
Request for surgical clearance:  What type of surgery is being performed? Rt foot reconstruction  1. When is this surgery scheduled? pending   2. Are there any medications that need to be held prior to surgery and how long? The office don't know   3. Name of physician performing surgery? Dr Wylene Simmer  4. What is your office phone and fax number? Fax # [phone]...   Want to know status of clearance that was faxed to [place] on yesterday. Please advise.

## 2014-09-13 NOTE — Telephone Encounter (Signed)
Pt called stating that he had spoken with [facility] yesterday and she said that she was faxing over his clearance. Informed pt that I see the phone note from [facility] stating "Cleared for surgery. Left message." Informed pt that I would fax this information over to Dr. Nona Dell office. Pt verbalized understanding.

## 2014-09-14 ENCOUNTER — Other Ambulatory Visit: Payer: Self-pay | Admitting: *Deleted

## 2014-09-14 ENCOUNTER — Telehealth: Payer: Self-pay | Admitting: *Deleted

## 2014-09-14 ENCOUNTER — Encounter: Payer: Self-pay | Admitting: Cardiology

## 2014-09-14 ENCOUNTER — Ambulatory Visit (INDEPENDENT_AMBULATORY_CARE_PROVIDER_SITE_OTHER): Payer: Medicare Other | Admitting: Cardiology

## 2014-09-14 VITALS — BP 142/80 | HR 62 | Ht 72.0 in | Wt 226.0 lb

## 2014-09-14 DIAGNOSIS — I491 Atrial premature depolarization: Secondary | ICD-10-CM | POA: Diagnosis not present

## 2014-09-14 DIAGNOSIS — I471 Supraventricular tachycardia, unspecified: Secondary | ICD-10-CM

## 2014-09-14 DIAGNOSIS — I119 Hypertensive heart disease without heart failure: Secondary | ICD-10-CM

## 2014-09-14 MED ORDER — LOSARTAN POTASSIUM 50 MG PO TABS: 50.0000 mg | ORAL_TABLET | Freq: Every day | ORAL | Status: DC

## 2014-09-14 NOTE — Progress Notes (Signed)
Cardiology Office Note   Date:  09/14/2014   ID:  [name] [name], DOB 1935-05-25, MRN [id number]  PCP:  Henrine Screws, MD  Cardiologist:   Darlin Coco, MD   No chief complaint on file.     History of Present Illness: [name] [name] is a 79 y.o. male who presents for follow-up office visit  This pleasant 79 year old gentleman is seen for a scheduled followup office visit. In the past he has had successful rotator cuff surgery on his right shoulder by Dr. Netta Cedars. He underwent total right hip replacement with an anterior approach by Dr. Alvan Dame on Thursday, 08/04/2013. He has a past history of essential hypertension and a past history of paroxysmal supraventricular tachycardia. He does not have any history of ischemic heart disease. He had a cardiac catheterization in 2001 showing normal coronary arteries. he had a normal nuclear stress test in 2008. He had an echocardiogram in 2011 which showed mild LVH with normal systolic function and with impaired relaxation. He has mild aortic sclerosis by echo as well as trace mitral regurgitation. His most recent nuclear stress test was 12/16/12 showing an ejection fraction of 59% and no ischemia or wall motion abnormalities. He does have a chronically abnormal electrocardiogram with abnormal T waves.At his last visit he was having problems with lightheadedness.  His blood work suggested dehydration.  We stopped his HCTZ.  He has felt much better since the medication change.  He is no longer having dizzy spells and his balance is much improved. He has a past history of having had ablation for frequent PVCs done at the [facility] clinic in the early 1990s. The patient had a 30 day monitor recently which did not show any SVT or sustained arrhythmias.  Occasional PACs were noted.  No atrial fib The patient has a chronically ruptured tendon in his right ankle and Dr. Wylene Simmer will perform ankle surgery on Tuesday, May 3.  We have given surgical  clearance for this.  Past Medical History  Diagnosis Date  . Hypertension   . History of paroxysmal supraventricular tachycardia     DR. Zyriah Mask IS PT'S CARDIOLOGIST  . Vertigo, benign positional     [name]  . Palpitations     History of ventricular ectopy. He is status post radiofrequency ablation at the [facility] in the early 90's. for PVC's  . BPH (benign prostatic hyperplasia)     s/p TURP in July 2012  . Aortic sclerosis     MILD PER ECHO AS WELL AS TRACE MITRAL REGURGITATION - PER CARDIOLOGY OFFICE NOTES 06/2012  . EKG abnormalities     INCOMPLETE RIGHT BUNDLE BRANCH BLOCK AND CHRONIC T-WAVE ABNORMALITIES - PER CARDIOLOGY OFFICE NOTES 06/2012  . Complication of anesthesia     HX OF MINOR ITCHING AFTER ANESTHESIA FOR HIP REPLACEMENT AND FOR COLON SURGERY  . GERD (gastroesophageal reflux disease)   . Arthritis     Past Surgical History  Procedure Laterality Date  . Hernia repair  1990  . Ankle surgery    . Tonsillectomy  Age 41  . Kidney stone surgery    . US echocardiography  06-20-2009    Est EF 55-60%  . Cardiac catheterization  2002    NORMAL CORONARY ARTERIES  . Joint replacement  ? 2009    LEFT HIP REPLACEMENT  . Colon surgery  ? 1994  . Transurethral resection of prostate    .  Total knee arthroplasty Bilateral 09/13/2012    Procedure: TOTAL KNEE BILATERAL;  Surgeon: Mauri Pole, MD;  Location: [facility];  Service: Orthopedics;  Laterality: Bilateral;  . Right shoulder rotator cuff repair    . Total hip arthroplasty Right 08/04/2013    Procedure: RIGHT TOTAL HIP ARTHROPLASTY ANTERIOR APPROACH;  Surgeon: Mauri Pole, MD;  Location: [facility];  Service: Orthopedics;  Laterality: Right;     Current Outpatient Prescriptions  Medication Sig Dispense Refill  . aspirin 81 MG tablet Take 81 mg by mouth daily.    . celecoxib (CELEBREX) 200 MG capsule Take 200 mg by mouth 2 (two) times daily as needed for  mild pain.    . Cholecalciferol (VITAMIN D PO) Take 1,000 mg by mouth daily.     . lansoprazole (PREVACID) 30 MG capsule Take 30 mg by mouth daily as needed (heart  burn).    . losartan (COZAAR) 50 MG tablet Take 1 tablet (50 mg total) by mouth daily. 90 tablet 3  . metoprolol (LOPRESSOR) 50 MG tablet Take 50 mg by mouth as needed (for SVT).    Marland Kitchen metoprolol succinate (TOPROL-XL) 50 MG 24 hr tablet Take 1 tablet in the morning and 1/2 tablet in the evening 135 tablet 3  . polycarbophil (FIBERCON) 625 MG tablet Take 625 mg by mouth daily as needed for mild constipation.    . potassium citrate (UROCIT-K) 10 MEQ (1080 MG) SR tablet Take 10 mEq by mouth 3 (three) times daily with meals.    . Propylene Glycol (SYSTANE BALANCE) 0.6 % SOLN Place 1 drop into both eyes 3 (three) times daily as needed (dry eyes).     No current facility-administered medications for this visit.    Allergies:   Hydrocodone-acetaminophen and Ramipril    Social History:  The patient  reports that he has never smoked. He does not have any smokeless tobacco history on file. He reports that he drinks alcohol. He reports that he does not use illicit drugs.   Family History:  The patient's family history includes Heart attack in his mother. There is no history of Other.    ROS:  Please see the history of present illness.   Otherwise, review of systems are positive for none.   All other systems are reviewed and negative.    PHYSICAL EXAM: VS:  BP 142/80 mmHg  [name] 64  Ht 6' (1.829 m)  Wt 226 lb (102.513 kg)  BMI 30.64 kg/m2 , BMI Body mass index is 30.64 kg/(m^2). GEN: Well nourished, well developed, in no acute distress HEENT: normal Neck: no JVD, carotid bruits, or masses Cardiac: RRR; no murmurs, rubs, or gallops,no edema  Respiratory:  clear to auscultation bilaterally, normal work of breathing GI: soft, nontender, nondistended, + BS MS: no deformity or atrophy Skin: warm and dry, no rash Neuro:  Strength and  sensation are intact Psych: euthymic mood, full affect   EKG:  EKG is not ordered today.    Recent Labs: 07/07/2014: Hemoglobin 15.6; Platelets 196 08/10/2014: BUN 22; Creatinine 1.28; Potassium 4.0; Sodium 136    Lipid Panel No results found for: CHOL, TRIG, HDL, CHOLHDL, VLDL, LDLCALC, LDLDIRECT    Wt Readings from Last 3 Encounters:  09/14/14 226 lb (102.513 kg)  08/01/14 223 lb 6.4 oz (101.334 kg)  07/07/14 226 lb (102.513 kg)         ASSESSMENT AND PLAN:  1 essential hypertension without heart failure 2. History of paroxysmal supraventricular tachycardia 3. GERD 4. osteoarthritis  of knees status post knee surgery 5.  Rupture of tendon in right ankle.  Surgery planned by Dr. Doran Durand for May 3     Current medicines are reviewed at length with the patient today.  The patient does not have concerns regarding medicines.  The following changes have been made:  His systolic blood pressure is now slightly high.  We will increase losartan to 50 mg daily  Labs/ tests ordered today include:   Orders Placed This Encounter  Procedures  . Basic metabolic panel     Disposition: Okay for surgery next week. Return in 3 months for office visit EKG and BMET  Signed, Darlin Coco, MD  09/14/2014 3:06 PM    [facility] [place], [place], [place]  [address] Phone: [phone]; Fax: [phone]

## 2014-09-14 NOTE — Patient Instructions (Signed)
Medication Instructions:  INCREASE YOUR LOSARTAN TO 50 MG DAILY   Labwork: NONE  Testing/Procedures: NONE  Follow-Up: Your physician recommends that you schedule a follow-up appointment in: 3 MONTH OV/EKG/BMET

## 2014-09-14 NOTE — Telephone Encounter (Signed)
Left message with Abigail Butts has been faxed and to call back if form not received

## 2014-09-14 NOTE — Telephone Encounter (Signed)
Event monitor reviewed by  Dr. Mare Ferrari  Interpretation: No significant arrhythmia Reviewed at office visit today

## 2014-09-19 DIAGNOSIS — M66871 Spontaneous rupture of other tendons, right ankle and foot: Secondary | ICD-10-CM | POA: Diagnosis not present

## 2014-09-19 DIAGNOSIS — G8918 Other acute postprocedural pain: Secondary | ICD-10-CM | POA: Diagnosis not present

## 2014-09-19 DIAGNOSIS — X58XXXA Exposure to other specified factors, initial encounter: Secondary | ICD-10-CM | POA: Diagnosis not present

## 2014-09-19 DIAGNOSIS — M24571 Contracture, right ankle: Secondary | ICD-10-CM | POA: Diagnosis not present

## 2014-09-19 DIAGNOSIS — S86211A Strain of muscle(s) and tendon(s) of anterior muscle group at lower leg level, right leg, initial encounter: Secondary | ICD-10-CM | POA: Diagnosis not present

## 2014-09-19 DIAGNOSIS — Y929 Unspecified place or not applicable: Secondary | ICD-10-CM | POA: Diagnosis not present

## 2014-10-02 DIAGNOSIS — S86211A Strain of muscle(s) and tendon(s) of anterior muscle group at lower leg level, right leg, initial encounter: Secondary | ICD-10-CM | POA: Diagnosis not present

## 2014-10-05 DIAGNOSIS — J309 Allergic rhinitis, unspecified: Secondary | ICD-10-CM | POA: Diagnosis not present

## 2014-10-18 DIAGNOSIS — Z4789 Encounter for other orthopedic aftercare: Secondary | ICD-10-CM | POA: Diagnosis not present

## 2014-10-18 DIAGNOSIS — S86211D Strain of muscle(s) and tendon(s) of anterior muscle group at lower leg level, right leg, subsequent encounter: Secondary | ICD-10-CM | POA: Diagnosis not present

## 2014-11-02 DIAGNOSIS — S86211D Strain of muscle(s) and tendon(s) of anterior muscle group at lower leg level, right leg, subsequent encounter: Secondary | ICD-10-CM | POA: Diagnosis not present

## 2014-11-14 DIAGNOSIS — S86211D Strain of muscle(s) and tendon(s) of anterior muscle group at lower leg level, right leg, subsequent encounter: Secondary | ICD-10-CM | POA: Diagnosis not present

## 2014-11-17 DIAGNOSIS — S86211D Strain of muscle(s) and tendon(s) of anterior muscle group at lower leg level, right leg, subsequent encounter: Secondary | ICD-10-CM | POA: Diagnosis not present

## 2014-11-21 ENCOUNTER — Other Ambulatory Visit: Payer: Self-pay | Admitting: Internal Medicine

## 2014-11-21 ENCOUNTER — Ambulatory Visit
Admission: RE | Admit: 2014-11-21 | Discharge: 2014-11-21 | Disposition: A | Discharge status: Home / Self Care | Payer: Medicare Other | Source: Ambulatory Visit | Attending: Internal Medicine | Admitting: Internal Medicine

## 2014-11-21 ENCOUNTER — Other Ambulatory Visit: Payer: Self-pay | Admitting: Cardiology

## 2014-11-21 ENCOUNTER — Other Ambulatory Visit: Payer: Medicare Other

## 2014-11-21 DIAGNOSIS — I10 Essential (primary) hypertension: Secondary | ICD-10-CM | POA: Diagnosis not present

## 2014-11-21 DIAGNOSIS — S86211D Strain of muscle(s) and tendon(s) of anterior muscle group at lower leg level, right leg, subsequent encounter: Secondary | ICD-10-CM | POA: Diagnosis not present

## 2014-11-21 DIAGNOSIS — R600 Localized edema: Secondary | ICD-10-CM | POA: Diagnosis not present

## 2014-11-21 DIAGNOSIS — Z0001 Encounter for general adult medical examination with abnormal findings: Secondary | ICD-10-CM | POA: Diagnosis not present

## 2014-11-21 DIAGNOSIS — E559 Vitamin D deficiency, unspecified: Secondary | ICD-10-CM | POA: Diagnosis not present

## 2014-11-21 DIAGNOSIS — I6529 Occlusion and stenosis of unspecified carotid artery: Secondary | ICD-10-CM | POA: Diagnosis not present

## 2014-11-21 DIAGNOSIS — J309 Allergic rhinitis, unspecified: Secondary | ICD-10-CM | POA: Diagnosis not present

## 2014-11-21 DIAGNOSIS — Z1389 Encounter for screening for other disorder: Secondary | ICD-10-CM | POA: Diagnosis not present

## 2014-11-21 DIAGNOSIS — Z125 Encounter for screening for malignant neoplasm of prostate: Secondary | ICD-10-CM | POA: Diagnosis not present

## 2014-11-21 DIAGNOSIS — H9313 Tinnitus, bilateral: Secondary | ICD-10-CM | POA: Diagnosis not present

## 2014-11-21 DIAGNOSIS — E041 Nontoxic single thyroid nodule: Secondary | ICD-10-CM | POA: Diagnosis not present

## 2014-11-21 DIAGNOSIS — Z79899 Other long term (current) drug therapy: Secondary | ICD-10-CM | POA: Diagnosis not present

## 2014-11-21 IMAGING — US US EXTREM LOW VENOUS*R*
1 series · 13 of 24 positions shown · non-contrast
Comparison: None.

CLINICAL DATA: Acute right lower extremity edema



[Series 1: us extrem low venous*right* · 13 of 37 slices shown]
[im 1/37]
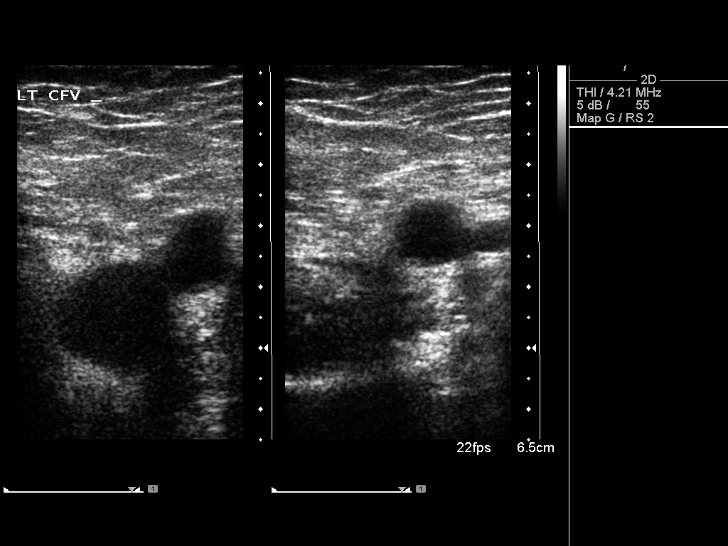
[im 4/37]
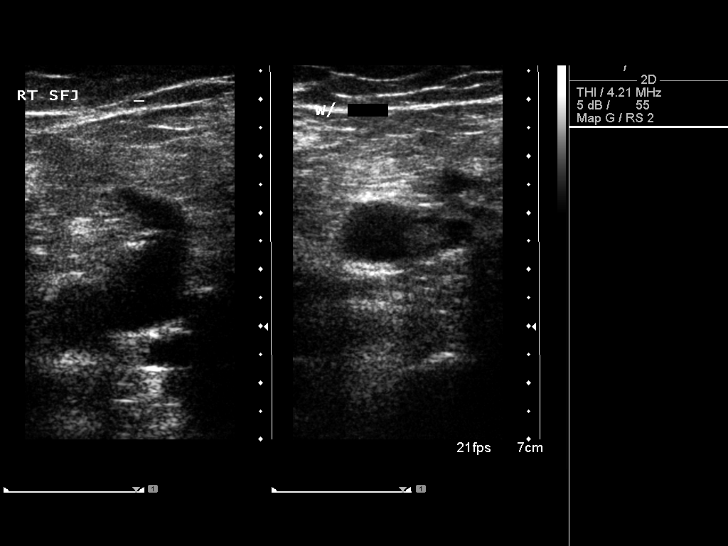
[im 7/37]
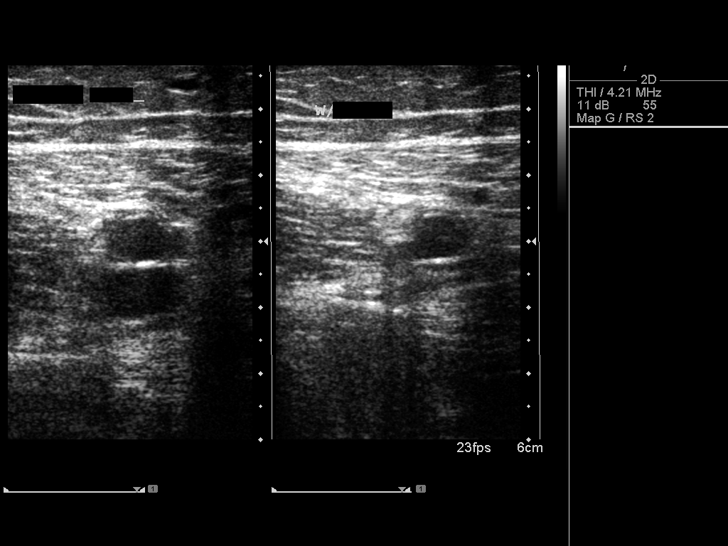
[im 10/37]
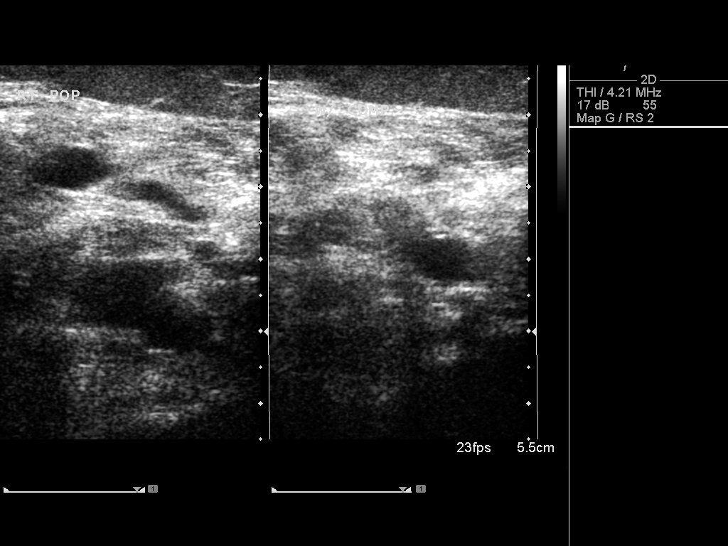
[im 13/37]
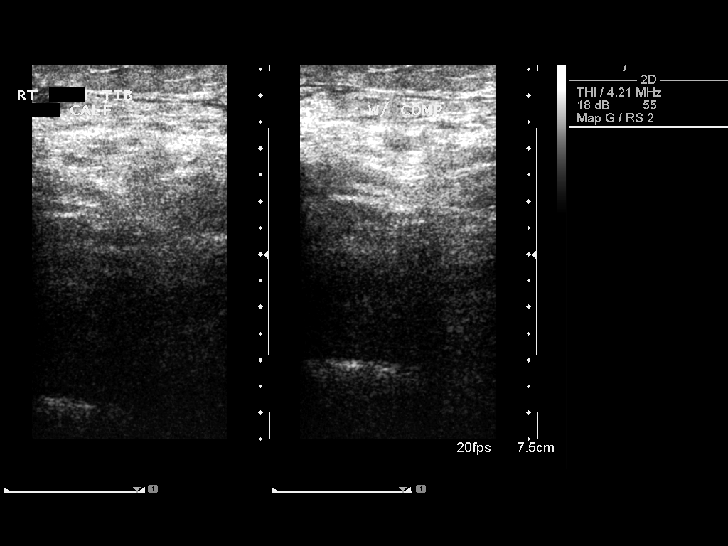
[im 16/37]
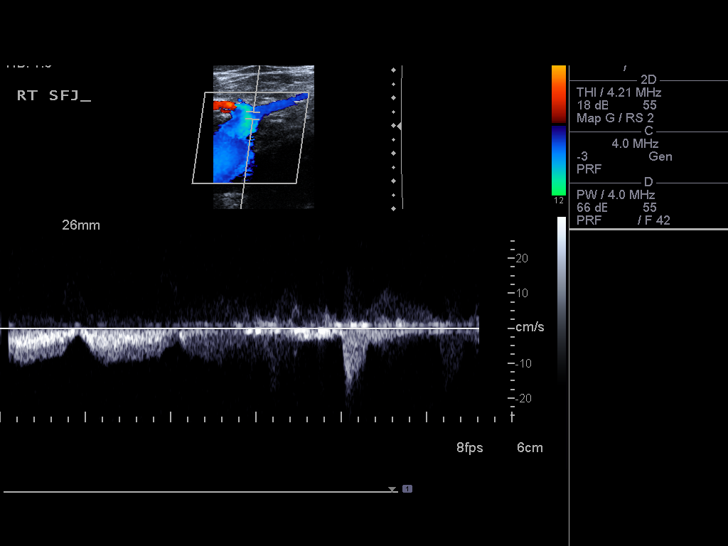
[im 19/37]
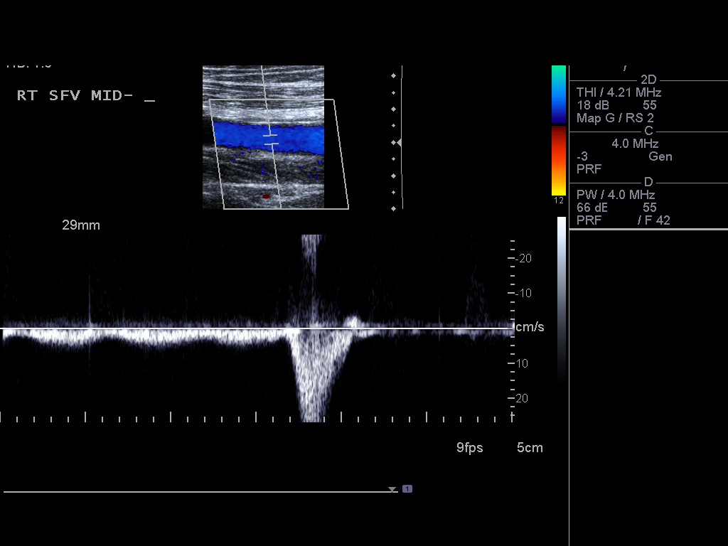
[im 21/37]
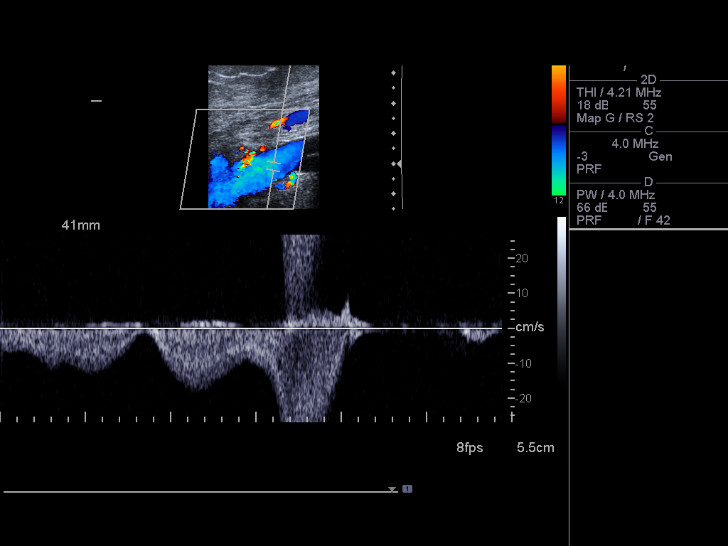
[im 24/37]
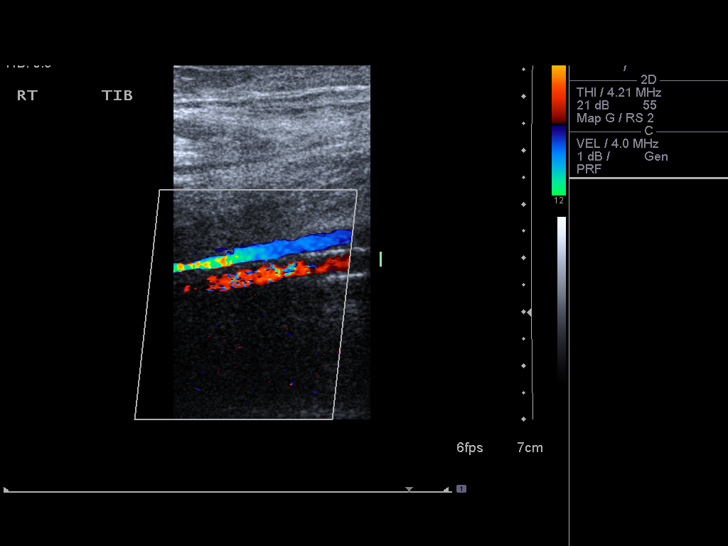
[im 27/37]
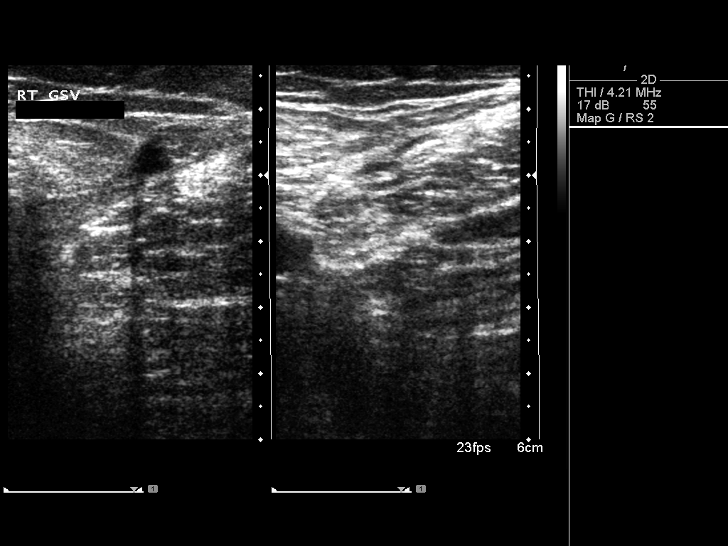
[im 30/37]
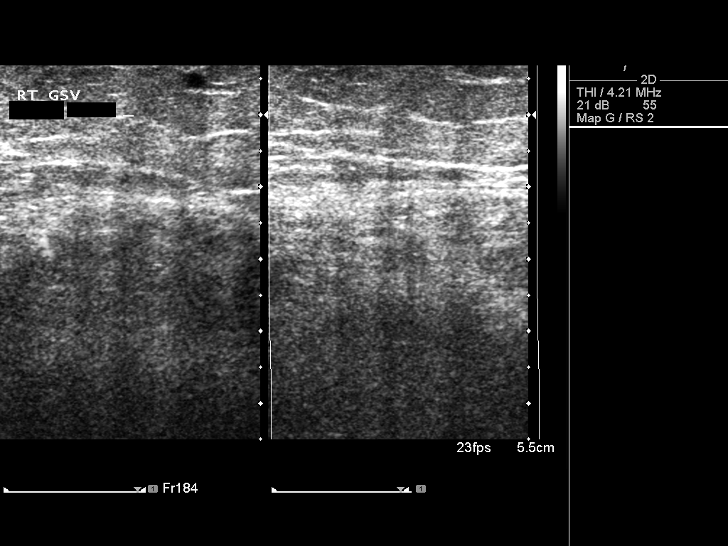
[im 33/37]
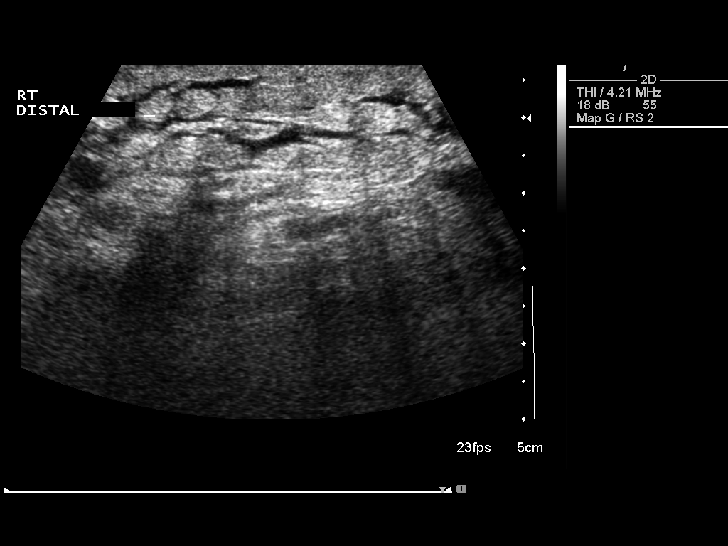
[im 37/37]
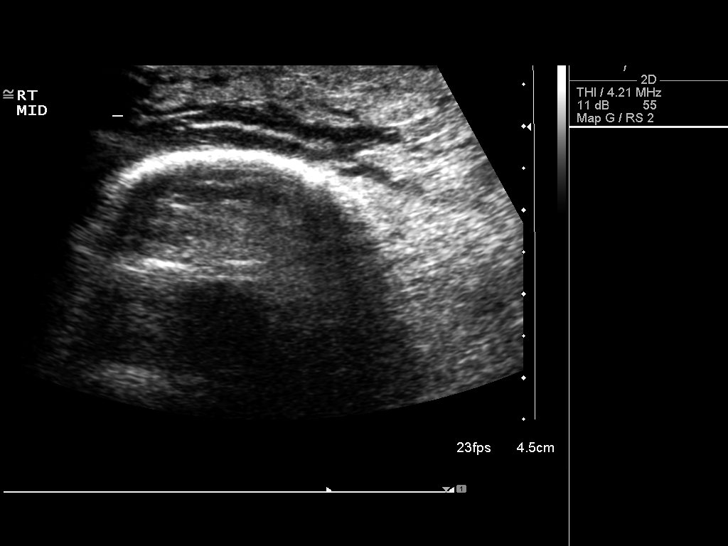

[13 of 24 positions shown; findings below may reference images not displayed]

FINDINGS: Contralateral Common Femoral Vein: Respiratory phasicity is normal
and symmetric with the symptomatic side. No evidence of thrombus.
Normal compressibility.

Common Femoral Vein: No evidence of thrombus. Normal
compressibility, respiratory phasicity and response to augmentation.

Saphenofemoral Junction: No evidence of thrombus. Normal
compressibility and flow on color Doppler imaging.

Profunda Femoral Vein: No evidence of thrombus. Normal
compressibility and flow on color Doppler imaging.

Femoral Vein: No evidence of thrombus. Normal compressibility,
respiratory phasicity and response to augmentation.

Popliteal Vein: No evidence of thrombus. Normal compressibility,
respiratory phasicity and response to augmentation.

Calf Veins: No evidence of thrombus. Normal compressibility and flow
on color Doppler imaging.

Superficial Great Saphenous Vein: No evidence of thrombus. Normal
compressibility and flow on color Doppler imaging.

Venous Reflux:  None.

Other Findings:  Peripheral calf subcutaneous edema present.
IMPRESSION: No evidence of deep venous thrombosis.

## 2014-11-22 DIAGNOSIS — S86211D Strain of muscle(s) and tendon(s) of anterior muscle group at lower leg level, right leg, subsequent encounter: Secondary | ICD-10-CM | POA: Diagnosis not present

## 2014-11-22 DIAGNOSIS — Z4789 Encounter for other orthopedic aftercare: Secondary | ICD-10-CM | POA: Diagnosis not present

## 2014-11-23 DIAGNOSIS — S86211D Strain of muscle(s) and tendon(s) of anterior muscle group at lower leg level, right leg, subsequent encounter: Secondary | ICD-10-CM | POA: Diagnosis not present

## 2014-11-24 DIAGNOSIS — S86211D Strain of muscle(s) and tendon(s) of anterior muscle group at lower leg level, right leg, subsequent encounter: Secondary | ICD-10-CM | POA: Diagnosis not present

## 2014-12-04 DIAGNOSIS — S86211D Strain of muscle(s) and tendon(s) of anterior muscle group at lower leg level, right leg, subsequent encounter: Secondary | ICD-10-CM | POA: Diagnosis not present

## 2014-12-06 DIAGNOSIS — R319 Hematuria, unspecified: Secondary | ICD-10-CM | POA: Diagnosis not present

## 2014-12-12 DIAGNOSIS — Z85828 Personal history of other malignant neoplasm of skin: Secondary | ICD-10-CM | POA: Diagnosis not present

## 2014-12-12 DIAGNOSIS — C4442 Squamous cell carcinoma of skin of scalp and neck: Secondary | ICD-10-CM | POA: Diagnosis not present

## 2014-12-12 DIAGNOSIS — C4441 Basal cell carcinoma of skin of scalp and neck: Secondary | ICD-10-CM | POA: Diagnosis not present

## 2014-12-13 ENCOUNTER — Ambulatory Visit (INDEPENDENT_AMBULATORY_CARE_PROVIDER_SITE_OTHER): Payer: Medicare Other | Admitting: Cardiology

## 2014-12-13 ENCOUNTER — Encounter: Payer: Self-pay | Admitting: Cardiology

## 2014-12-13 ENCOUNTER — Other Ambulatory Visit (INDEPENDENT_AMBULATORY_CARE_PROVIDER_SITE_OTHER): Payer: Medicare Other | Admitting: *Deleted

## 2014-12-13 VITALS — BP 140/80 | HR 60 | Ht 72.0 in | Wt 231.1 lb

## 2014-12-13 DIAGNOSIS — I119 Hypertensive heart disease without heart failure: Secondary | ICD-10-CM

## 2014-12-13 DIAGNOSIS — I471 Supraventricular tachycardia: Secondary | ICD-10-CM

## 2014-12-13 DIAGNOSIS — I491 Atrial premature depolarization: Secondary | ICD-10-CM

## 2014-12-13 DIAGNOSIS — R42 Dizziness and giddiness: Secondary | ICD-10-CM | POA: Diagnosis not present

## 2014-12-13 LAB — BASIC METABOLIC PANEL
BUN: 22 mg/dL (ref 6–23)
CO2: 30 meq/L (ref 19–32)
Calcium: 9.3 mg/dL (ref 8.4–10.5)
Chloride: 106 mEq/L (ref 96–112)
Creatinine, Ser: 1.28 mg/dL (ref 0.40–1.50)
GFR: 57.92 mL/min — ABNORMAL LOW (ref >60.00)
Glucose, Bld: 89 mg/dL (ref 70–99)
Potassium: 4.2 mEq/L (ref 3.5–5.1)
SODIUM: 140 meq/L (ref 135–145)

## 2014-12-13 NOTE — Progress Notes (Signed)
2    Cardiology Office Note   Date:  12/13/2014   ID:  [name] [name], DOB 1936-03-13, MRN [id number]  PCP:  Henrine Screws, MD  Cardiologist: Darlin Coco MD  No chief complaint on file.     History of Present Illness: [name] [name] is a 79 y.o. male who presents for a scheduled follow-up visit This pleasant 79 year old gentleman is seen for a scheduled followup office visit. In the past he has had successful rotator cuff surgery on his right shoulder by Dr. Netta Cedars. He underwent total right hip replacement with an anterior approach by Dr. Alvan Dame on Thursday, 08/04/2013. He has a past history of essential hypertension and a past history of paroxysmal supraventricular tachycardia. He does not have any history of ischemic heart disease. He had a cardiac catheterization in 2001 showing normal coronary arteries. he had a normal nuclear stress test in 2008. He had an echocardiogram in 2011 which showed mild LVH with normal systolic function and with impaired relaxation. He has mild aortic sclerosis by echo as well as trace mitral regurgitation. His most recent nuclear stress test was 12/16/12 showing an ejection fraction of 59% and no ischemia or wall motion abnormalities. He does have a chronically abnormal electrocardiogram with abnormal T waves.At his last visit he was having problems with lightheadedness. His blood work suggested dehydration. We stopped his HCTZ. He has felt much better since the medication change. He still has occasional lightheaded spells.  They last about 10 minutes.  He is not aware of any racing of his heart during these episodes. He has a past history of having had ablation for frequent PVCs done at the [facility] clinic in the early 1990s. The patient had a 30 day monitor recently which did not show any SVT or sustained arrhythmias. Occasional PACs were noted. No atrial fib The patient has a chronically ruptured tendon in his right ankle and Dr. Wylene Simmer  performed successful surgery on May 3.  The patient still has moderate edema of the right foot and lower leg but his pain is much improved.  Past Medical History  Diagnosis Date  . Hypertension   . History of paroxysmal supraventricular tachycardia     DR. Shatonya Passon IS PT'S CARDIOLOGIST  . Vertigo, benign positional     [name]  . Palpitations     History of ventricular ectopy. He is status post radiofrequency ablation at the [facility] in the early 90's. for PVC's  . BPH (benign prostatic hyperplasia)     s/p TURP in July 2012  . Aortic sclerosis     MILD PER ECHO AS WELL AS TRACE MITRAL REGURGITATION - PER CARDIOLOGY OFFICE NOTES 06/2012  . EKG abnormalities     INCOMPLETE RIGHT BUNDLE BRANCH BLOCK AND CHRONIC T-WAVE ABNORMALITIES - PER CARDIOLOGY OFFICE NOTES 06/2012  . Complication of anesthesia     HX OF MINOR ITCHING AFTER ANESTHESIA FOR HIP REPLACEMENT AND FOR COLON SURGERY  . GERD (gastroesophageal reflux disease)   . Arthritis     Past Surgical History  Procedure Laterality Date  . Hernia repair  1990  . Ankle surgery    . Tonsillectomy  Age 50  . Kidney stone surgery    . US echocardiography  06-20-2009    Est EF 55-60%  . Cardiac catheterization  2002    NORMAL CORONARY ARTERIES  . Joint replacement  ? 2009    LEFT HIP REPLACEMENT  .  Colon surgery  ? 1994  . Transurethral resection of prostate    . Total knee arthroplasty Bilateral 09/13/2012    Procedure: TOTAL KNEE BILATERAL;  Surgeon: Mauri Pole, MD;  Location: [facility];  Service: Orthopedics;  Laterality: Bilateral;  . Right shoulder rotator cuff repair    . Total hip arthroplasty Right 08/04/2013    Procedure: RIGHT TOTAL HIP ARTHROPLASTY ANTERIOR APPROACH;  Surgeon: Mauri Pole, MD;  Location: [facility];  Service: Orthopedics;  Laterality: Right;     Current Outpatient Prescriptions  Medication Sig Dispense Refill  . aspirin 81 MG tablet Take 81  mg by mouth daily.    . celecoxib (CELEBREX) 200 MG capsule Take 200 mg by mouth 2 (two) times daily as needed for mild pain.    . Cholecalciferol (VITAMIN D PO) Take 2,000 mg by mouth daily.     . lansoprazole (PREVACID) 30 MG capsule Take 30 mg by mouth daily as needed (heart  burn).    . losartan (COZAAR) 50 MG tablet Take 1 tablet (50 mg total) by mouth daily. 90 tablet 3  . metoprolol (LOPRESSOR) 50 MG tablet Take 50 mg by mouth as needed (for SVT).    Marland Kitchen metoprolol succinate (TOPROL-XL) 50 MG 24 hr tablet TAKE 1 TABLET BY MOUTH IN THE AM AND 1/2 TABLET BY MOUTH IN THE PM    . polycarbophil (FIBERCON) 625 MG tablet Take 625 mg by mouth daily as needed for mild constipation.    . potassium citrate (UROCIT-K) 10 MEQ (1080 MG) SR tablet Take 10 mEq by mouth 3 (three) times daily with meals.    . Propylene Glycol (SYSTANE BALANCE) 0.6 % SOLN Place 1 drop into both eyes 3 (three) times daily as needed (dry eyes).     No current facility-administered medications for this visit.    Allergies:   Hydrocodone-acetaminophen and Ramipril    Social History:  The patient  reports that he has never smoked. He does not have any smokeless tobacco history on file. He reports that he drinks alcohol. He reports that he does not use illicit drugs.   Family History:  The patient's family history includes Heart attack in his mother. There is no history of Other.    ROS:  Please see the history of present illness.   Otherwise, review of systems are positive for none.   All other systems are reviewed and negative.    PHYSICAL EXAM: VS:  BP 140/80 mmHg  Pulse 60  Ht 6' (1.829 m)  Wt 231 lb 1.9 oz (104.835 kg)  BMI 31.34 kg/m2 , BMI Body mass index is 31.34 kg/(m^2). GEN: Well nourished, well developed, in no acute distress HEENT: normal Neck: no JVD, carotid bruits, or masses Cardiac: RRR; no murmurs, rubs, or gallops,no edema  Respiratory:  clear to auscultation bilaterally, normal work of  breathing GI: soft, nontender, nondistended, + BS MS: no deformity or atrophy Skin: warm and dry, no rash Neuro:  Strength and sensation are intact Psych: euthymic mood, full affect   EKG:  EKG is ordered today. The ekg ordered today demonstrates normal sinus rhythm with occasional premature atrial complexes.  Nonspecific ST-T wave abnormalities unchanged from prior tracing of 08/01/14.   Recent Labs: 07/07/2014: Hemoglobin 15.6; Platelets 196 08/10/2014: BUN 22; Creatinine, Ser 1.28; Potassium 4.0; Sodium 136    Lipid Panel No results found for: CHOL, TRIG, HDL, CHOLHDL, VLDL, LDLCALC, LDLDIRECT    Wt Readings from Last 3 Encounters:  12/13/14 231 lb 1.9  oz (104.835 kg)  09/14/14 226 lb (102.513 kg)  08/01/14 223 lb 6.4 oz (101.334 kg)       ASSESSMENT AND PLAN:  1 essential hypertension without heart failure 2. History of paroxysmal supraventricular tachycardia 3. GERD 4. osteoarthritis of knees status post knee surgery 5.  Occasional dizzy spells.  These spells have lessened since we stopped his diuretic.  Plan: Continue current medication.  His weight is up and he will try to lose the extra weight.  Increase activity as tolerated.  Recheck in 4 months for office visit and EKG.   Current medicines are reviewed at length with the patient today. The patient does not have concerns regarding medicines.  The following changes have been made: His systolic blood pressure is now slightly high. We will increase losartan to 50 mg daily   Current medicines are reviewed at length with the patient today.  The patient does not have concerns regarding medicines.  The following changes have been made:  no change  Labs/ tests ordered today include:   Orders Placed This Encounter  Procedures  . EKG 12-Lead      Signed, Darlin Coco MD 12/13/2014 4:56 PM    [facility] [place], [place], [place]  [address] Phone: [phone]; Fax: [phone]

## 2014-12-13 NOTE — Patient Instructions (Signed)
Medication Instructions:  Your physician recommends that you continue on your current medications as directed. Please refer to the Current Medication list given to you today.  Labwork: none  Testing/Procedures: none  Follow-Up: Your physician recommends that you schedule a follow-up appointment in: 4 month ov/ekg

## 2014-12-13 NOTE — Progress Notes (Signed)
Quick Note:  Please report to patient. The recent labs are stable. Continue same medication and careful diet. ______ 

## 2014-12-13 NOTE — Addendum Note (Signed)
Addended by: Eulis Foster on: 12/13/2014 04:05 PM   Modules accepted: Orders

## 2014-12-18 DIAGNOSIS — C4442 Squamous cell carcinoma of skin of scalp and neck: Secondary | ICD-10-CM | POA: Diagnosis not present

## 2014-12-18 DIAGNOSIS — Z85828 Personal history of other malignant neoplasm of skin: Secondary | ICD-10-CM | POA: Diagnosis not present

## 2015-01-12 DIAGNOSIS — N401 Enlarged prostate with lower urinary tract symptoms: Secondary | ICD-10-CM | POA: Diagnosis not present

## 2015-01-12 DIAGNOSIS — R312 Other microscopic hematuria: Secondary | ICD-10-CM | POA: Diagnosis not present

## 2015-01-12 DIAGNOSIS — N200 Calculus of kidney: Secondary | ICD-10-CM | POA: Diagnosis not present

## 2015-01-12 DIAGNOSIS — N138 Other obstructive and reflux uropathy: Secondary | ICD-10-CM | POA: Diagnosis not present

## 2015-02-15 DIAGNOSIS — C44729 Squamous cell carcinoma of skin of left lower limb, including hip: Secondary | ICD-10-CM | POA: Diagnosis not present

## 2015-02-15 DIAGNOSIS — Z85828 Personal history of other malignant neoplasm of skin: Secondary | ICD-10-CM | POA: Diagnosis not present

## 2015-03-05 DIAGNOSIS — Z23 Encounter for immunization: Secondary | ICD-10-CM | POA: Diagnosis not present

## 2015-04-02 ENCOUNTER — Encounter: Payer: Self-pay | Admitting: Cardiology

## 2015-04-16 ENCOUNTER — Ambulatory Visit: Payer: Medicare Other | Admitting: Cardiology

## 2015-04-20 ENCOUNTER — Ambulatory Visit: Payer: Medicare Other | Admitting: Cardiology

## 2015-05-02 ENCOUNTER — Ambulatory Visit (INDEPENDENT_AMBULATORY_CARE_PROVIDER_SITE_OTHER): Payer: Medicare Other | Admitting: Cardiology

## 2015-05-02 ENCOUNTER — Encounter: Payer: Self-pay | Admitting: Cardiology

## 2015-05-02 VITALS — BP 150/76 | HR 55 | Ht 72.0 in | Wt 227.1 lb

## 2015-05-02 DIAGNOSIS — I119 Hypertensive heart disease without heart failure: Secondary | ICD-10-CM | POA: Diagnosis not present

## 2015-05-02 DIAGNOSIS — I491 Atrial premature depolarization: Secondary | ICD-10-CM | POA: Diagnosis not present

## 2015-05-02 NOTE — Patient Instructions (Signed)
Medication Instructions:  Your physician recommends that you continue on your current medications as directed. Please refer to the Current Medication list given to you today.  Labwork: noe  Testing/Procedures: none  Follow-Up: Your physician wants you to follow-up in: 4 month ov/ekg with Dr Trellis Moment will receive a reminder letter in the mail two months in advance. If you don't receive a letter, please call our office to schedule the follow-up appointment.  If you need a refill on your cardiac medications before your next appointment, please call your pharmacy.

## 2015-05-02 NOTE — Progress Notes (Signed)
Cardiology Office Note   Date:  05/02/2015   ID:  [name] [name], DOB 1936-03-04, MRN [id number]  PCP:  [name] Screws, MD  Cardiologist: [name] Coco MD  No chief complaint on file.     History of Present Illness: [name] [name] is a 79 y.o. male who presents for scheduled four-month follow-up visit  This pleasant 79 year old gentleman is seen for a scheduled followup office visit. In the past he has had successful rotator cuff surgery on his right shoulder by Dr. Netta [name]. He underwent total right hip replacement with an anterior approach by Dr. Alvan [name] on Thursday, 08/04/2013. He has a past history of essential hypertension and a past history of paroxysmal supraventricular tachycardia. He does not have any history of ischemic heart disease. He had a cardiac catheterization in 2001 showing normal coronary arteries. he had a normal nuclear stress test in 2008. He had an echocardiogram in 2011 which showed mild LVH with normal systolic function and with impaired relaxation. He has mild aortic sclerosis by echo as well as trace mitral regurgitation. His most recent nuclear stress test was 12/16/12 showing an ejection fraction of 59% and no ischemia or wall motion abnormalities. He does have a chronically abnormal electrocardiogram with abnormal T waves.At his last visit he was having problems with lightheadedness. His blood work suggested dehydration. We stopped his HCTZ. He has felt much better since the medication change. He still has occasional lightheaded spells. They last about 10 minutes. He is not aware of any racing of his heart during these episodes. He has a past history of having had ablation for frequent PVCs done at the [facility] clinic in the early 1990s. The patient had a 30 day monitor recently which did not show any SVT or sustained arrhythmias. Occasional PACs were noted. No atrial fib Since last visit he has been doing well.  He will note occasional  dizziness after taking a hot shower.  He has not been aware of any recurrent supraventricular tachycardia.   Past Medical History  Diagnosis Date  . Hypertension   . History of paroxysmal supraventricular tachycardia     DR. Makhari [name] IS PT'S CARDIOLOGIST  . Vertigo, benign positional     [name]  . Palpitations     History of ventricular ectopy. He is status post radiofrequency ablation at the [facility] in the early 90's. for PVC's  . BPH (benign prostatic hyperplasia)     s/p TURP in July 2012  . Aortic sclerosis ([place])     MILD PER ECHO AS WELL AS TRACE MITRAL REGURGITATION - PER CARDIOLOGY OFFICE NOTES 06/2012  . EKG abnormalities     INCOMPLETE RIGHT BUNDLE BRANCH BLOCK AND CHRONIC T-WAVE ABNORMALITIES - PER CARDIOLOGY OFFICE NOTES 06/2012  . Complication of anesthesia     HX OF MINOR ITCHING AFTER ANESTHESIA FOR HIP REPLACEMENT AND FOR COLON SURGERY  . GERD (gastroesophageal reflux disease)   . Arthritis     Past Surgical History  Procedure Laterality Date  . Hernia repair  1990  . Ankle surgery    . Tonsillectomy  Age 4  . Kidney stone surgery    . US echocardiography  06-20-2009    Est EF 55-60%  . Cardiac catheterization  2002    NORMAL CORONARY ARTERIES  . Joint replacement  ? 2009    LEFT HIP REPLACEMENT  . Colon surgery  ? 1994  . Transurethral resection of  prostate    . Total knee arthroplasty Bilateral 09/13/2012    Procedure: TOTAL KNEE BILATERAL;  Surgeon: Mauri Pole, MD;  Location: [facility];  Service: Orthopedics;  Laterality: Bilateral;  . Right shoulder rotator cuff repair    . Total hip arthroplasty Right 08/04/2013    Procedure: RIGHT TOTAL HIP ARTHROPLASTY ANTERIOR APPROACH;  Surgeon: Mauri Pole, MD;  Location: [facility];  Service: Orthopedics;  Laterality: Right;     Current Outpatient Prescriptions  Medication Sig Dispense Refill  . aspirin 81 MG tablet Take 81 mg by mouth daily.      . celecoxib (CELEBREX) 200 MG capsule Take 200 mg by mouth 2 (two) times daily as needed for mild pain.    . Cholecalciferol (VITAMIN D PO) Take 2,000 mg by mouth daily.     . lansoprazole (PREVACID) 30 MG capsule Take 30 mg by mouth daily as needed (heart  burn).    . losartan (COZAAR) 50 MG tablet Take 1 tablet (50 mg total) by mouth daily. 90 tablet 3  . metoprolol succinate (TOPROL-XL) 50 MG 24 hr tablet TAKE 1 TABLET BY MOUTH IN THE AM AND 1/2 TABLET BY MOUTH IN THE PM    . polycarbophil (FIBERCON) 625 MG tablet Take 625 mg by mouth daily as needed for mild constipation.    . potassium citrate (UROCIT-K) 10 MEQ (1080 MG) SR tablet Take 10 mEq by mouth 3 (three) times daily with meals.     No current facility-administered medications for this visit.    Allergies:   Hydrocodone-acetaminophen; Oxycodone; and Ramipril    Social History:  The patient  reports that he has never smoked. He does not have any smokeless tobacco history on file. He reports that he drinks alcohol. He reports that he does not use illicit drugs.   Family History:  The patient's family history includes Heart attack in his mother. There is no history of Other.    ROS:  Please see the history of present illness.   Otherwise, review of systems are positive for none.   All other systems are reviewed and negative.    PHYSICAL EXAM: VS:  BP 150/76 mmHg  Pulse 55  Ht 6' (1.829 m)  Wt 227 lb 1.9 oz (103.021 kg)  BMI 30.80 kg/m2 , BMI Body mass index is 30.8 kg/(m^2). GEN: Well nourished, well developed, in no acute distress HEENT: normal Neck: no JVD, carotid bruits, or masses Cardiac: RRR; no murmurs, rubs, or gallops,no edema  Respiratory:  clear to auscultation bilaterally, normal work of breathing GI: soft, nontender, nondistended, + BS MS: no deformity or atrophy Skin: warm and dry, no rash Neuro:  Strength and sensation are intact Psych: euthymic mood, full affect   EKG:  EKG is ordered today. The ekg  ordered today demonstrates sinus bradycardia at 55 bpm.  Minimal voltage for LVH.  Inferolateral ST and T-wave abnormalities unchanged since prior tracing of 12/13/14   Recent Labs: 07/07/2014: Hemoglobin 15.6; Platelets 196 12/13/2014: BUN 22; Creatinine, Ser 1.28; Potassium 4.2; Sodium 140    Lipid Panel No results found for: CHOL, TRIG, HDL, CHOLHDL, VLDL, LDLCALC, LDLDIRECT    Wt Readings from Last 3 Encounters:  05/02/15 227 lb 1.9 oz (103.021 kg)  12/13/14 231 lb 1.9 oz (104.835 kg)  09/14/14 226 lb (102.513 kg)        ASSESSMENT AND PLAN:  1 essential hypertension without heart failure 2. History of paroxysmal supraventricular tachycardia 3. GERD 4. osteoarthritis of knees status post  knee surgery 5. Occasional dizzy spells. These spells have lessened since we stopped his diuretic.  Plan: Continue current medication.  Blood pressure slightly high today but we discussed this and in view of his symptoms of orthostatic hypotension after showering we will not add any additional medication at this time.  After my retirement he will follow-up with [name] for office visit and EKG in 4 months.   Current medicines are reviewed at length with the patient today.  The patient does not have concerns regarding medicines.  The following changes have been made:  no change  Labs/ tests ordered today include:   Orders Placed This Encounter  Procedures  . EKG 12-Lead      Signed, [name] Coco MD 05/02/2015 10:16 AM    [address], [place], [place]  [address] Phone: [phone]; Fax: [phone]

## 2015-05-07 DIAGNOSIS — J069 Acute upper respiratory infection, unspecified: Secondary | ICD-10-CM | POA: Diagnosis not present

## 2015-05-07 DIAGNOSIS — J111 Influenza due to unidentified influenza virus with other respiratory manifestations: Secondary | ICD-10-CM | POA: Diagnosis not present

## 2015-06-06 ENCOUNTER — Telehealth: Payer: Self-pay | Admitting: Cardiology

## 2015-06-07 NOTE — Telephone Encounter (Signed)
Close encounter 

## 2015-06-15 ENCOUNTER — Encounter: Payer: Self-pay | Admitting: Physician Assistant

## 2015-06-15 ENCOUNTER — Ambulatory Visit (INDEPENDENT_AMBULATORY_CARE_PROVIDER_SITE_OTHER): Payer: Medicare Other | Admitting: Physician Assistant

## 2015-06-15 DIAGNOSIS — I10 Essential (primary) hypertension: Secondary | ICD-10-CM | POA: Diagnosis not present

## 2015-06-15 DIAGNOSIS — I493 Ventricular premature depolarization: Secondary | ICD-10-CM | POA: Insufficient documentation

## 2015-06-15 DIAGNOSIS — R42 Dizziness and giddiness: Secondary | ICD-10-CM | POA: Diagnosis not present

## 2015-06-15 DIAGNOSIS — I471 Supraventricular tachycardia: Secondary | ICD-10-CM

## 2015-06-15 MED ORDER — METOPROLOL SUCCINATE ER 50 MG PO TB24: 50.0000 mg | ORAL_TABLET | Freq: Two times a day (BID) | ORAL | Status: DC

## 2015-06-15 NOTE — Patient Instructions (Signed)
Your physician has recommended you make the following change in your medication: the metoprolol succ ER 50 mg has been increased to twice a day. A new prescription has been sent to [facility] to reflect this change.  Your physician recommends that you schedule a follow-up appointment in: 3 months with Dr Stanford Breed.

## 2015-06-15 NOTE — Progress Notes (Signed)
Patient ID: [name] [name], male   DOB: 04/26/1936, 80 y.o.   MRN: [id number]    Date:  06/15/2015   ID:  [name] [name], DOB April 24, 1936, MRN [id number]  PCP:  Henrine Screws, MD  Primary Cardiologist:  Mare Ferrari  Chief Complaint  Patient presents with  . Advice Only    patient complains of palpitations and slight chest discomfort     History of Present Illness:  [name] [name] is a 80 y.o. male who has had successful rotator cuff surgery on his right shoulder by Dr. Netta Cedars. He underwent total right hip replacement with an anterior approach by Dr. Alvan Dame on Thursday, 08/04/2013.  He has a past history of essential hypertension and a past history of paroxysmal supraventricular tachycardia. He does not have any history of ischemic heart disease. He had a cardiac catheterization in 2001 showing normal coronary arteries. he had a normal nuclear stress test in 2008. He had an echocardiogram in 2011 which showed mild LVH with normal systolic function and with impaired relaxation. He has mild aortic sclerosis by echo as well as trace mitral regurgitation. His most recent nuclear stress test was 12/16/12 showing an ejection fraction of 59% and no ischemia or wall motion abnormalities. He does have a chronically abnormal electrocardiogram with abnormal T waves.At his last visit he was having problems with lightheadedness. His blood work suggested dehydration. We stopped his HCTZ. He has felt much better since the medication change. He still has occasional lightheaded spells. They last about 10 minutes. He is not aware of any racing of his heart during these episodes.  He has a history of ablation for frequent PVCs done at the [facility] clinic in the early 1990s. The patient had a 30 day monitor recently which did not show any SVT or sustained arrhythmias. Occasional PACs were noted. No atrial fib.  He was last seen by Dr. Mare Ferrari on 05/02/15.  [name] [name] reports having more frequent dizzy spells;  3-4 in the last few months where he feels like he may pass out.  He did wear a HR monitor which did not show anything.  He takes an extra lopressor tab f his HR is elevated, which to him means >80.  His BP is running 135-160/77-100.  He had one day of mild, intermittent CP a week ago, with no other symptoms and no reoccurrence. He seems to have more PVCs in the morning.    The patient currently denies nausea, vomiting, fever, shortness of breath, orthopnea, PND, cough, congestion, abdominal pain, hematochezia, melena, lower extremity edema, claudication.  Wt Readings from Last 3 Encounters:  06/15/15 228 lb 14.4 oz (103.828 kg)  05/02/15 227 lb 1.9 oz (103.021 kg)  12/13/14 231 lb 1.9 oz (104.835 kg)     Past Medical History  Diagnosis Date  . Hypertension   . History of paroxysmal supraventricular tachycardia     [name]  . Vertigo, benign positional     [name]  . Palpitations     History of ventricular ectopy. He is status post radiofrequency ablation at the [facility] in the early 90's. for PVC's  . BPH (benign prostatic hyperplasia)     s/p TURP in July 2012  . Aortic sclerosis ([place])     MILD PER ECHO AS WELL AS TRACE MITRAL REGURGITATION - PER CARDIOLOGY OFFICE NOTES 06/2012  . EKG abnormalities     INCOMPLETE RIGHT BUNDLE  BRANCH BLOCK AND CHRONIC T-WAVE ABNORMALITIES - PER CARDIOLOGY OFFICE NOTES 06/2012  . Complication of anesthesia     HX OF MINOR ITCHING AFTER ANESTHESIA FOR HIP REPLACEMENT AND FOR COLON SURGERY  . GERD (gastroesophageal reflux disease)   . Arthritis     Current Outpatient Prescriptions  Medication Sig Dispense Refill  . aspirin 81 MG tablet Take 81 mg by mouth daily.    . celecoxib (CELEBREX) 200 MG capsule Take 200 mg by mouth 2 (two) times a week.     . Cholecalciferol (VITAMIN D PO) Take 2,000 mg by mouth daily.     . lansoprazole (PREVACID) 30 MG capsule Take  30 mg by mouth daily as needed (heart  burn).    . losartan (COZAAR) 50 MG tablet Take 1 tablet (50 mg total) by mouth daily. (Patient taking differently: Take 25 mg by mouth daily. ) 90 tablet 3  . metoprolol (LOPRESSOR) 50 MG tablet Take 50 mg by mouth as needed. For rapid heartrate    . metoprolol succinate (TOPROL-XL) 50 MG 24 hr tablet Take 1 tablet (50 mg total) by mouth 2 (two) times daily. 60 tablet 6  . polycarbophil (FIBERCON) 625 MG tablet Take 625 mg by mouth daily as needed for mild constipation.    . potassium citrate (UROCIT-K) 10 MEQ (1080 MG) SR tablet Take 10 mEq by mouth 3 (three) times daily with meals.     No current facility-administered medications for this visit.    Allergies:    Allergies  Allergen Reactions  . Hydrocodone-Acetaminophen Other (See Comments)    Confusion  . Oxycodone Other (See Comments)    Angry and confused  . Ramipril     Cough    Social History:  The patient  reports that he has never smoked. He does not have any smokeless tobacco history on file. He reports that he drinks alcohol. He reports that he does not use illicit drugs.   Family history:   Family History  Problem Relation Age of Onset  . Other Neg Hx   . Heart attack Mother     ROS:  Please see the history of present illness.  All other systems reviewed and negative.   PHYSICAL EXAM: VS:  BP 154/76 mmHg  Pulse 61  Ht 6' (1.829 m)  Wt 228 lb 14.4 oz (103.828 kg)  BMI 31.04 kg/m2 Well nourished, well developed, in no acute distress HEENT: Pupils are equal round react to light accommodation extraocular movements are intact.  Neck: no JVDNo cervical lymphadenopathy. Cardiac: Regular rate and rhythm without murmurs rubs or gallops. Lungs:  clear to auscultation bilaterally, no wheezing, rhonchi or rales Abd: soft, nontender, positive bowel sounds all quadrants, no hepatosplenomegaly Ext: no lower extremity edema.  2+ radial and dorsalis pedis pulses. Skin: warm and  dry Neuro:  Grossly normal  EKG:   Sinus rhythm, .  ASSESSMENT AND PLAN:  Problem List Items Addressed This Visit    RESOLVED: SVT (supraventricular tachycardia) (HCC)   Relevant Medications   metoprolol (LOPRESSOR) 50 MG tablet   metoprolol succinate (TOPROL-XL) 50 MG 24 hr tablet   Other Relevant Orders   EKG 12-Lead   PSVT (paroxysmal supraventricular tachycardia) (HCC)   Relevant Medications   metoprolol (LOPRESSOR) 50 MG tablet   metoprolol succinate (TOPROL-XL) 50 MG 24 hr tablet   Other Relevant Orders   EKG 12-Lead   HTN (hypertension)   Relevant Medications   metoprolol (LOPRESSOR) 50 MG tablet   metoprolol succinate (TOPROL-XL) 50  MG 24 hr tablet   Other Relevant Orders   EKG 12-Lead   Dizziness       Patient seemed to be having more frequent PVCs in the morning.Marland Kitchen He's worn a heart monitor recently which showed occasional PACs. Blood pressure is on the high side and has been running consistently 150s based on his numbers is 154/76 here in the office. Would have him increase his metoprolol succinate to whole pill in the evening and see if that decreases his morning palpitations gives him better blood pressure control. We'll have him follow-up in 3 months with [name]

## 2015-06-16 ENCOUNTER — Other Ambulatory Visit: Payer: Self-pay | Admitting: Cardiology

## 2015-06-18 NOTE — Telephone Encounter (Signed)
Rx request sent to pharmacy.  

## 2015-06-19 DIAGNOSIS — H01029 Squamous blepharitis unspecified eye, unspecified eyelid: Secondary | ICD-10-CM | POA: Diagnosis not present

## 2015-06-19 DIAGNOSIS — H11153 Pinguecula, bilateral: Secondary | ICD-10-CM | POA: Diagnosis not present

## 2015-06-19 DIAGNOSIS — H2513 Age-related nuclear cataract, bilateral: Secondary | ICD-10-CM | POA: Diagnosis not present

## 2015-06-21 DIAGNOSIS — D1801 Hemangioma of skin and subcutaneous tissue: Secondary | ICD-10-CM | POA: Diagnosis not present

## 2015-06-21 DIAGNOSIS — D485 Neoplasm of uncertain behavior of skin: Secondary | ICD-10-CM | POA: Diagnosis not present

## 2015-06-21 DIAGNOSIS — L570 Actinic keratosis: Secondary | ICD-10-CM | POA: Diagnosis not present

## 2015-06-21 DIAGNOSIS — Z85828 Personal history of other malignant neoplasm of skin: Secondary | ICD-10-CM | POA: Diagnosis not present

## 2015-06-21 DIAGNOSIS — D0462 Carcinoma in situ of skin of left upper limb, including shoulder: Secondary | ICD-10-CM | POA: Diagnosis not present

## 2015-06-21 DIAGNOSIS — L814 Other melanin hyperpigmentation: Secondary | ICD-10-CM | POA: Diagnosis not present

## 2015-06-21 DIAGNOSIS — L821 Other seborrheic keratosis: Secondary | ICD-10-CM | POA: Diagnosis not present

## 2015-06-28 DIAGNOSIS — M25511 Pain in right shoulder: Secondary | ICD-10-CM | POA: Diagnosis not present

## 2015-08-07 ENCOUNTER — Encounter (HOSPITAL_COMMUNITY): Payer: Self-pay | Admitting: *Deleted

## 2015-08-07 ENCOUNTER — Emergency Department (HOSPITAL_COMMUNITY)
Admission: EM | Admit: 2015-08-07 | Discharge: 2015-08-07 | Disposition: A | Discharge status: Home / Self Care | Payer: Medicare Other | Attending: Emergency Medicine | Admitting: Emergency Medicine

## 2015-08-07 ENCOUNTER — Emergency Department (HOSPITAL_COMMUNITY): Payer: Medicare Other

## 2015-08-07 DIAGNOSIS — R42 Dizziness and giddiness: Secondary | ICD-10-CM | POA: Insufficient documentation

## 2015-08-07 DIAGNOSIS — I959 Hypotension, unspecified: Secondary | ICD-10-CM | POA: Insufficient documentation

## 2015-08-07 DIAGNOSIS — I10 Essential (primary) hypertension: Secondary | ICD-10-CM | POA: Insufficient documentation

## 2015-08-07 DIAGNOSIS — R079 Chest pain, unspecified: Secondary | ICD-10-CM | POA: Insufficient documentation

## 2015-08-07 LAB — BASIC METABOLIC PANEL
ANION GAP: 12 (ref 5–15)
BUN: 26 mg/dL — ABNORMAL HIGH (ref 6–20)
CHLORIDE: 108 mmol/L (ref 101–111)
CO2: 19 mmol/L — ABNORMAL LOW (ref 22–32)
Calcium: 9.1 mg/dL (ref 8.9–10.3)
Creatinine, Ser: 1.37 mg/dL — ABNORMAL HIGH (ref 0.61–1.24)
GFR calc Af Amer: 56 mL/min — ABNORMAL LOW (ref >60)
GFR, EST NON AFRICAN AMERICAN: 48 mL/min — AB (ref >60)
Glucose, Bld: 131 mg/dL — ABNORMAL HIGH (ref 65–99)
POTASSIUM: 4.8 mmol/L (ref 3.5–5.1)
SODIUM: 139 mmol/L (ref 135–145)

## 2015-08-07 LAB — CBC
HEMATOCRIT: 47 % (ref 39.0–52.0)
HEMOGLOBIN: 15.8 g/dL (ref 13.0–17.0)
MCH: 30.6 pg (ref 26.0–34.0)
MCHC: 33.6 g/dL (ref 30.0–36.0)
MCV: 91.1 fL (ref 78.0–100.0)
Platelets: 171 10*3/uL (ref 150–400)
RBC: 5.16 MIL/uL (ref 4.22–5.81)
RDW: 13.5 % (ref 11.5–15.5)
WBC: 6.5 10*3/uL (ref 4.0–10.5)

## 2015-08-07 LAB — I-STAT TROPONIN, ED: Troponin i, poc: 0.03 ng/mL (ref 0.00–0.08)

## 2015-08-07 IMAGING — DX DG CHEST 2V
2 series · 2 of 2 positions shown · non-contrast
Comparison: [DATE]

CLINICAL DATA: Chest pain for 1 day.

EXAM:
CHEST  2 VIEW

[chest pa]
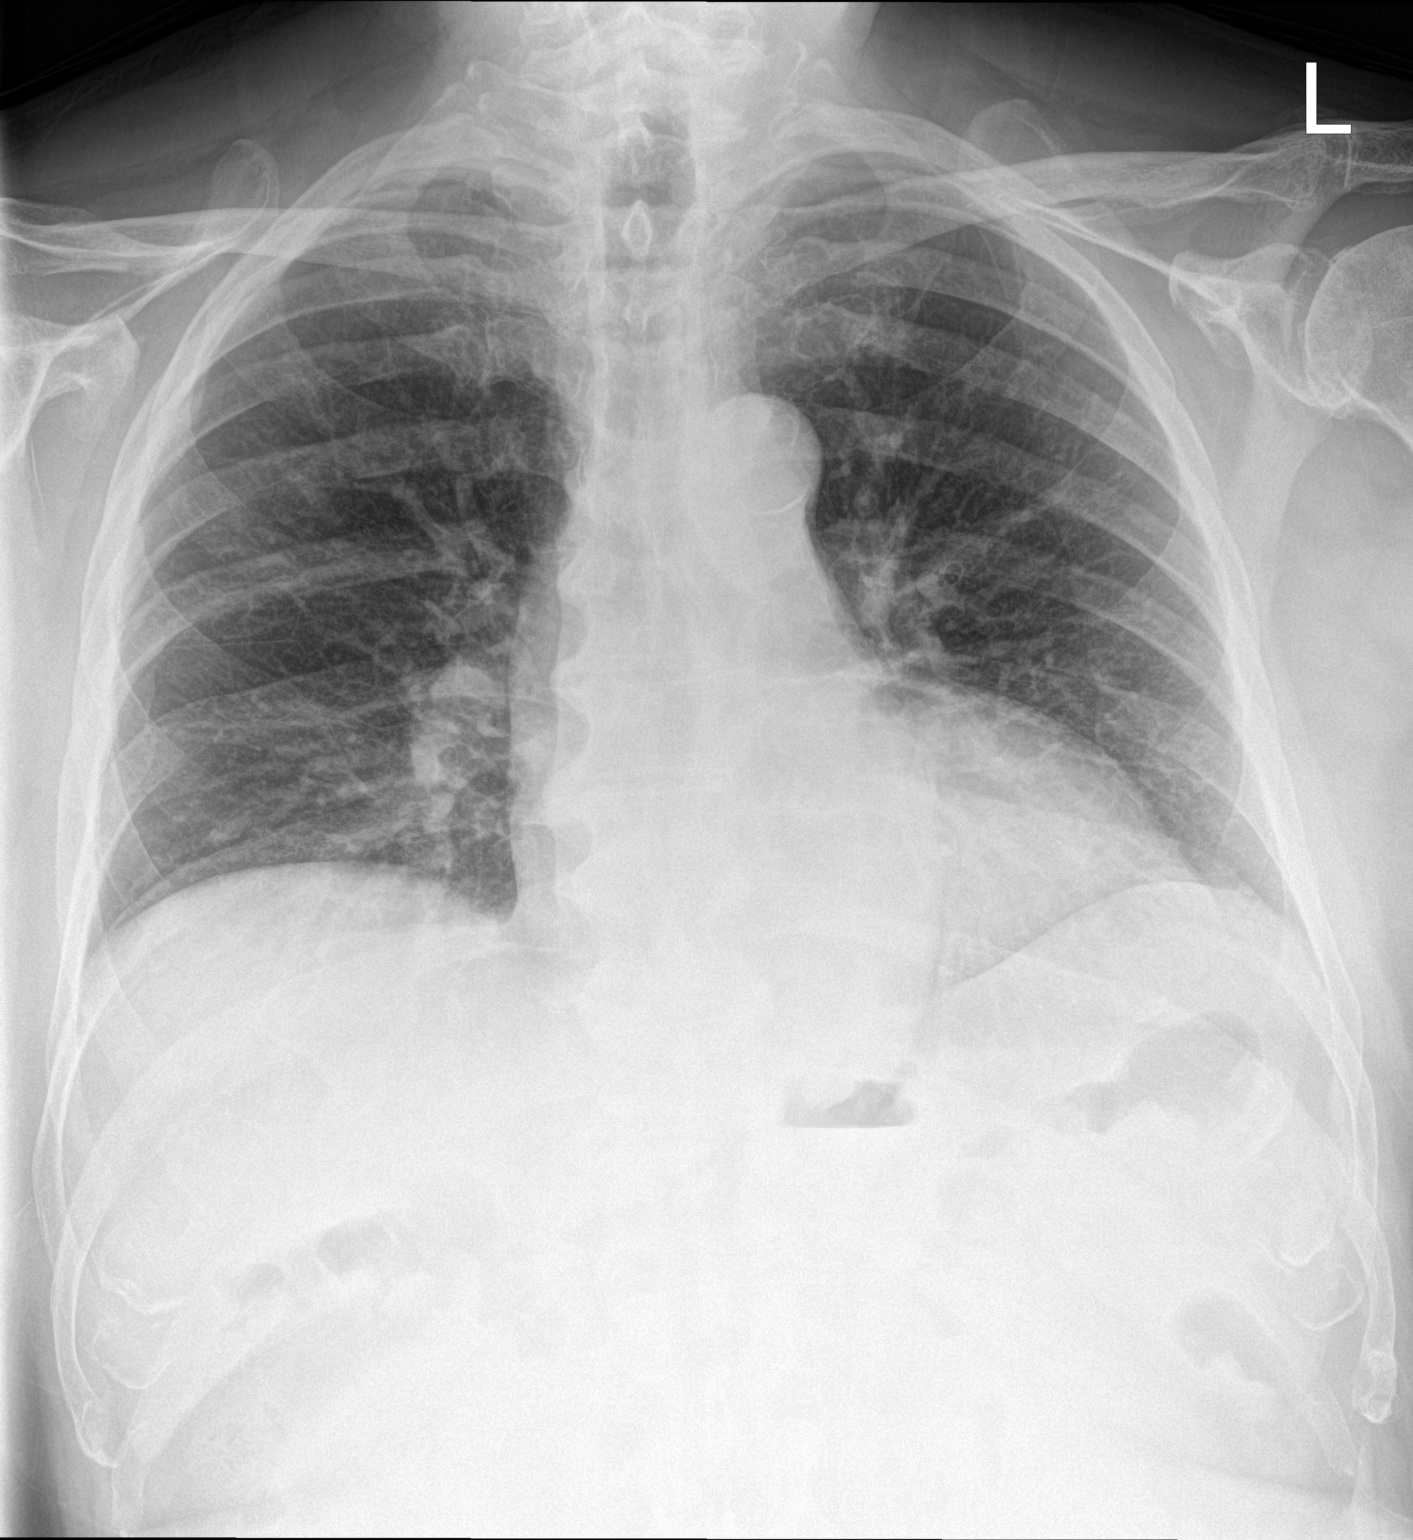

[chest lat]
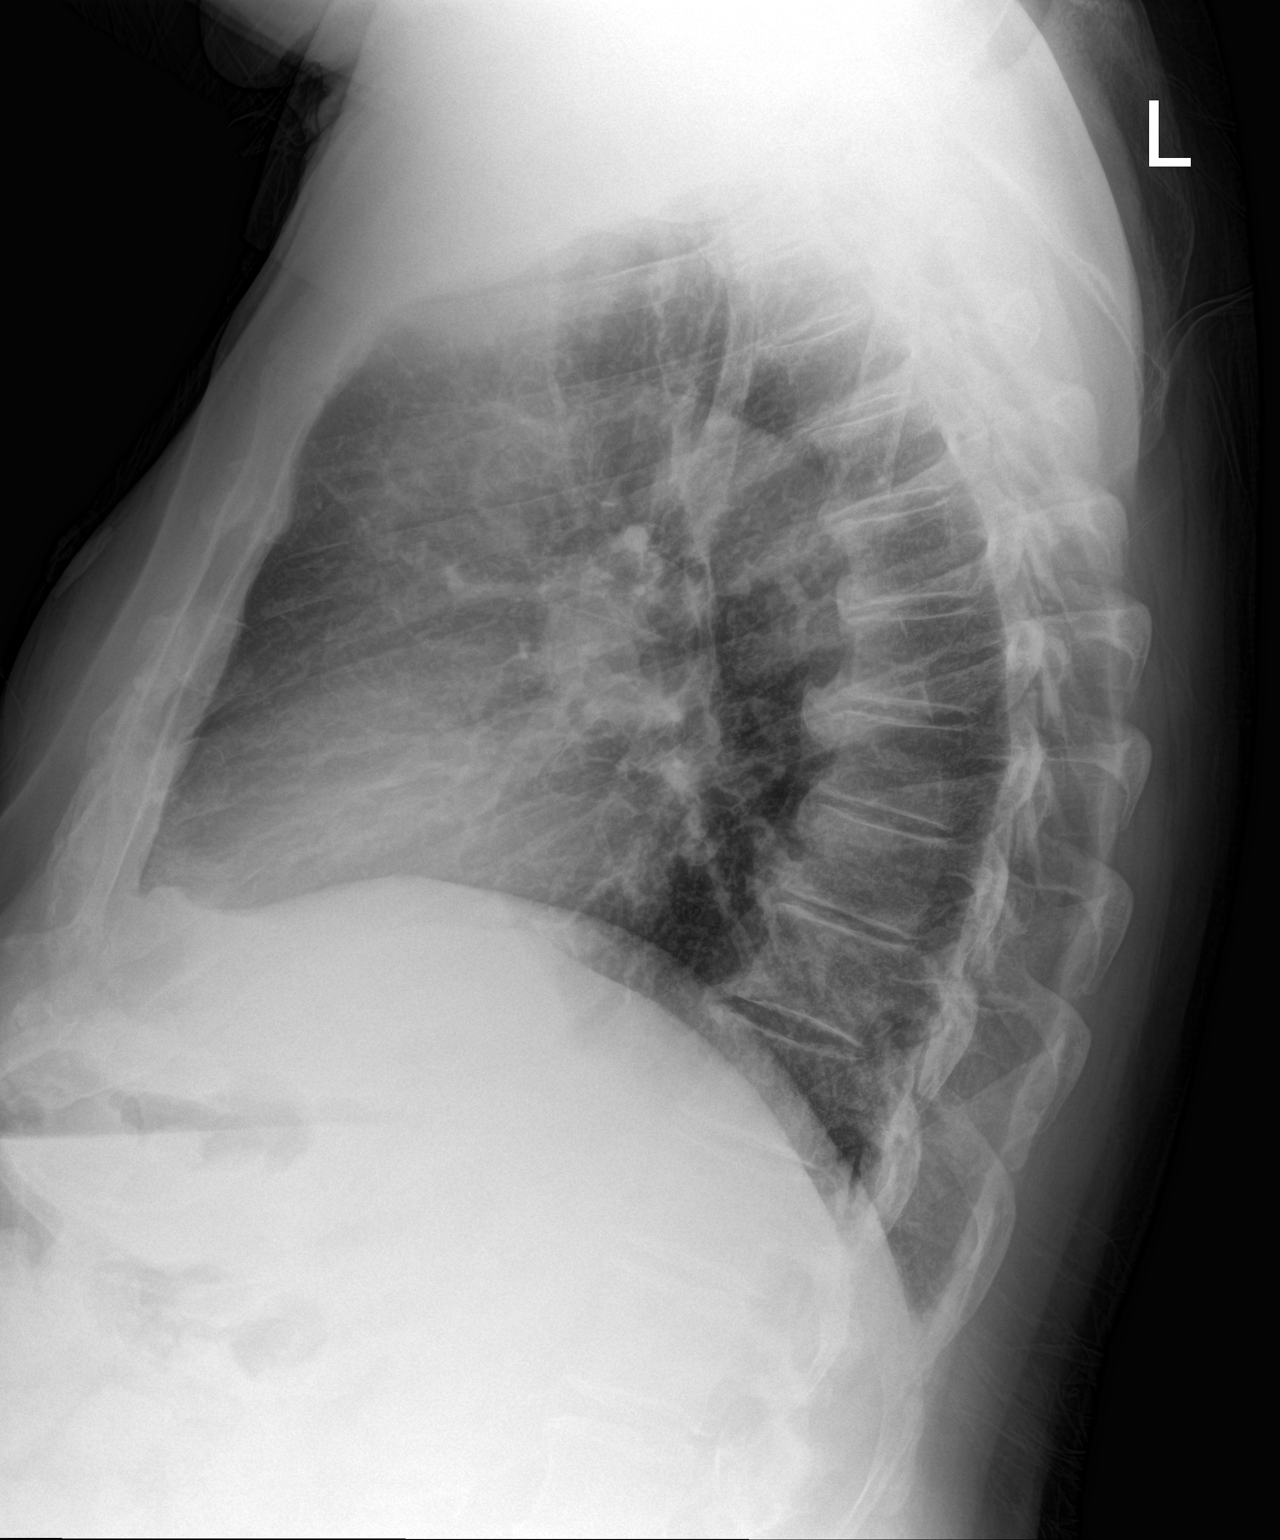

[2 of 2 positions shown; findings below may reference images not displayed]

FINDINGS: The cardiac silhouette remains upper limits of normal in size. Lung
volumes are unchanged. Chronic interstitial coarsening is again
seen, stable to slightly more prominent than on the prior study. No
acute airspace consolidation, overt pulmonary edema, pleural
effusion, or pneumothorax is identified. Thoracic spondylosis is
noted.
IMPRESSION: Chronic interstitial coarsening without evidence of active
cardiopulmonary disease.

## 2015-08-07 NOTE — ED Notes (Signed)
All triage documentation was done by Carlyn Reichert RN accidentally under Virgel Paling NTs name.

## 2015-08-07 NOTE — ED Notes (Signed)
Pt reports that he was working in his yard today and began having left sided CP and felt light headed and sweaty. Pt took his pulse and found it to be in the 180s as well as hypotensive with a systolic in the [id number]. Pt reports that the CP is starting to resolve and HR has decreased back to normal. Pt alert x4. NAD at this time.

## 2015-08-07 NOTE — ED Notes (Signed)
Pt states that he is feeling better, not having any chest pain. States he will call his PCP tomorrow. Vital signs rechecked.

## 2015-09-09 NOTE — Progress Notes (Signed)
HPI: FU SVT and hypertension; previously followed by Dr Mare Ferrari. He has a past history of having had ablation for frequent PVCs done at the [facility] clinic in the early 1990s. He does not have any history of ischemic heart disease. He had a cardiac catheterization in 2001 showing normal coronary arteries. Echocardiogram in 2011 showed mild LVH with normal systolic function and impaired relaxation. His most recent nuclear stress test was 12/16/12 showing an ejection fraction of 59% and no ischemia or wall motion abnormalities. He does have a chronically abnormal electrocardiogram with abnormal T waves. Event monitor March 2016 showed sinus with PACs and PVCs. Beta blocker increased at last office visit for increased blood pressure and palpitations. Patient had blood work drawn in the emergency room on March 21 because of chest pain and dizziness. Troponin negative. BUN 26 and creatinine 1.37; Hemoglobin 15.8. Since last seen, He continues to have occasional dizzy spells. These are always when he is walking. Not clearly associated with position. Occasional association with heart racing but other times not. No associated chest pain or dyspnea. He has not had frank syncope. He denies dyspnea on exertion, orthopnea, pedal edema or exertional chest pain.  Current Outpatient Prescriptions  Medication Sig Dispense Refill  . aspirin 81 MG tablet Take 81 mg by mouth daily.    . celecoxib (CELEBREX) 200 MG capsule Take 200 mg by mouth 2 (two) times a week.     . Cholecalciferol (VITAMIN D PO) Take 2,000 mg by mouth daily.     . lansoprazole (PREVACID) 30 MG capsule Take 30 mg by mouth daily as needed (heart  burn).    . losartan (COZAAR) 50 MG tablet Take 0.5 tablets (25 mg total) by mouth daily. 90 tablet 0  . metoprolol (LOPRESSOR) 50 MG tablet TAKE 1/2 TABLET TWICE DAILY 30 tablet 2  . metoprolol succinate (TOPROL-XL) 50 MG 24 hr tablet Take 1 tablet (50 mg total) by mouth 2 (two) times daily. 60  tablet 6  . polycarbophil (FIBERCON) 625 MG tablet Take 625 mg by mouth daily as needed for mild constipation.    . potassium citrate (UROCIT-K) 10 MEQ (1080 MG) SR tablet Take 10 mEq by mouth 3 (three) times daily with meals.     No current facility-administered medications for this visit.     Past Medical History  Diagnosis Date  . Hypertension   . History of paroxysmal supraventricular tachycardia     [name]  . Vertigo, benign positional     [name]  . Palpitations     History of ventricular ectopy. He is status post radiofrequency ablation at the [facility] in the early 90's. for PVC's  . BPH (benign prostatic hyperplasia)     s/p TURP in July 2012  . Aortic sclerosis ([place])     MILD PER ECHO AS WELL AS TRACE MITRAL REGURGITATION - PER CARDIOLOGY OFFICE NOTES 06/2012  . EKG abnormalities     INCOMPLETE RIGHT BUNDLE BRANCH BLOCK AND CHRONIC T-WAVE ABNORMALITIES - PER CARDIOLOGY OFFICE NOTES 06/2012  . Complication of anesthesia     HX OF MINOR ITCHING AFTER ANESTHESIA FOR HIP REPLACEMENT AND FOR COLON SURGERY  . GERD (gastroesophageal reflux disease)   . Arthritis     Past Surgical History  Procedure Laterality Date  . Hernia repair  1990  . Ankle surgery    . Tonsillectomy  Age 48  .  Kidney stone surgery    . US echocardiography  06-20-2009    Est EF 55-60%  . Cardiac catheterization  2002    NORMAL CORONARY ARTERIES  . Joint replacement  ? 2009    LEFT HIP REPLACEMENT  . Colon surgery  ? 1994  . Transurethral resection of prostate    . Total knee arthroplasty Bilateral 09/13/2012    Procedure: TOTAL KNEE BILATERAL;  Surgeon: Mauri Pole, MD;  Location: [facility];  Service: Orthopedics;  Laterality: Bilateral;  . Right shoulder rotator cuff repair    . Total hip arthroplasty Right 08/04/2013    Procedure: RIGHT TOTAL HIP ARTHROPLASTY ANTERIOR APPROACH;  Surgeon: Mauri Pole, MD;   Location: [facility];  Service: Orthopedics;  Laterality: Right;    Social History   Social History  . Marital Status: Married    Spouse Name: N/A  . Number of Children: N/A  . Years of Education: N/A   Occupational History  . Not on file.   Social History Main Topics  . Smoking status: Never Smoker   . Smokeless tobacco: Not on file  . Alcohol Use: 0.0 oz/week    0 Standard drinks or equivalent per week     Comment: Drink Wine almost daily  . Drug Use: No  . Sexual Activity: Not on file   Other Topics Concern  . Not on file   Social History Narrative    Family History  Problem Relation Age of Onset  . Other Neg Hx   . Heart attack Mother     ROS: no fevers or chills, productive cough, hemoptysis, dysphasia, odynophagia, melena, hematochezia, dysuria, hematuria, rash, seizure activity, orthopnea, PND, pedal edema, claudication. Remaining systems are negative.  Physical Exam: Well-developed well-nourished in no acute distress.  Skin is warm and dry.  HEENT is normal.  Neck is supple.  Chest is clear to auscultation with normal expansion.  Cardiovascular exam is regular rate and rhythm.  Abdominal exam nontender or distended. No masses palpated. Extremities show no edema. neuro grossly intact

## 2015-09-12 ENCOUNTER — Other Ambulatory Visit: Payer: Self-pay

## 2015-09-12 MED ORDER — LOSARTAN POTASSIUM 50 MG PO TABS: 25.0000 mg | ORAL_TABLET | Freq: Every day | ORAL | Status: DC

## 2015-09-13 ENCOUNTER — Encounter: Payer: Self-pay | Admitting: Cardiology

## 2015-09-13 ENCOUNTER — Ambulatory Visit (INDEPENDENT_AMBULATORY_CARE_PROVIDER_SITE_OTHER): Payer: Medicare Other | Admitting: Cardiology

## 2015-09-13 VITALS — BP 122/66 | HR 57 | Ht 72.0 in | Wt 228.0 lb

## 2015-09-13 DIAGNOSIS — I10 Essential (primary) hypertension: Secondary | ICD-10-CM | POA: Diagnosis not present

## 2015-09-13 DIAGNOSIS — R42 Dizziness and giddiness: Secondary | ICD-10-CM | POA: Diagnosis not present

## 2015-09-13 DIAGNOSIS — R55 Syncope and collapse: Secondary | ICD-10-CM

## 2015-09-13 MED ORDER — METOPROLOL SUCCINATE ER 50 MG PO TB24: ORAL_TABLET | ORAL | Status: DC

## 2015-09-13 NOTE — Patient Instructions (Addendum)
Medication Instructions:   CHANGE METOPROLOL TO 1 TABLET IN THE MORNING AND 1/2 TABLET IN THE EVENING  Testing/Procedures:  Your physician has recommended that you wear an event monitor. Event monitors are medical devices that record the heart's electrical activity. Doctors most often [place] these monitors to diagnose arrhythmias. Arrhythmias are problems with the speed or rhythm of the heartbeat. The monitor is a small, portable device. You can wear one while you do your normal daily activities. This is usually used to diagnose what is causing palpitations/syncope (passing out).  AT [place]:  Your physician recommends that you schedule a follow-up appointment in: [place]

## 2015-09-13 NOTE — Assessment & Plan Note (Addendum)
Patient is having near syncopal episodes. Etiology unclear. They are occurring 2-3 times weekly. Not orthostatic in the office. Blood pressure lying 135/83 with pulse 51. In the standing position 122/75 and pulse 58. We will plan a CardioNet to further assess. We may need to adjust the pressure regimen as well. He will bring his cuff at next office visit to correlate with ours.

## 2015-09-13 NOTE — Assessment & Plan Note (Addendum)
Blood pressure controlled. Continue present medications. Patient feels that some of his symptoms have worsenedSince his metoprolol was increased to 50 mg twice a day. We will resume 50 mg in the morning and 25 in the evening.

## 2015-09-13 NOTE — Assessment & Plan Note (Signed)
Continue beta blocker. 

## 2015-10-02 ENCOUNTER — Ambulatory Visit (INDEPENDENT_AMBULATORY_CARE_PROVIDER_SITE_OTHER): Payer: Medicare Other

## 2015-10-02 DIAGNOSIS — R55 Syncope and collapse: Secondary | ICD-10-CM | POA: Diagnosis not present

## 2015-10-25 ENCOUNTER — Telehealth: Payer: Self-pay | Admitting: *Deleted

## 2015-10-25 NOTE — Telephone Encounter (Signed)
New message  Pt wife returned call to discuss the heart monitor. Pt is heading back to [place]. Please call the Pt's cell. [phone]  Comments: Pt wife states that Kreg is fine he was playing golf all day and thank you

## 2015-10-25 NOTE — Telephone Encounter (Signed)
Received call from patient passed through to my voice mail while I was on other line. I was not able to answer this. I returned call. Per DPR OK to leave detailed msg on voice mail, I did so and informed pt I would try to reach out to him tomorrow also.

## 2015-10-25 NOTE — Telephone Encounter (Signed)
Follow-up    The pt was not so politely asking  to be transferred to your voicemail did not want me to make phone note. Thank you christy    The pt stated it was about his heart monitor

## 2015-10-25 NOTE — Telephone Encounter (Signed)
Pt of [name] w/ a [employer] cardiac event monitor.  Received fax notifications this afternoon for this patient.  Events occurred approx 10:18pm and 11:10pm CST on 10/24/15.  For SVT events 66 sec and 84 sec in duration. Peak rate of both was 170 bpm. The patient was contacted by [employer] monitoring services to get symptom report.   I have called patient and left msg requesting call back.  Notification reports given to Dr. Martinique ([employer]) for review.

## 2015-10-25 NOTE — Telephone Encounter (Addendum)
Spoke w/ Dr. Martinique. Recommendation - ER if symptomatic/ongoing tachycardia to r/o SVT vs A Flutter. Advised sooner f/u w [name] or APP may be warranted. Pt has noted hx of SVT and resting HR in 70s - Dr. Martinique did not advise further BBs due to the resting rate. I have attempted to reach patient and left a message to call.

## 2015-10-26 NOTE — Telephone Encounter (Signed)
No answer when dialed. 

## 2015-10-26 NOTE — Telephone Encounter (Signed)
Home line busy when dialed.

## 2015-11-08 NOTE — Progress Notes (Signed)
HPI: FU SVT and hypertension. He has a past history of having had ablation for frequent PVCs done at the [facility] clinic in the early 1990s. He had a cardiac catheterization in 2001 showing normal coronary arteries. Echocardiogram in 2011 showed mild LVH with normal systolic function and impaired relaxation. His most recent nuclear stress test was 12/16/12 showing an ejection fraction of 59% and no ischemia or wall motion abnormalities. He does have a chronically abnormal electrocardiogram with abnormal T waves. Event monitor March 2016 showed sinus with PACs and PVCs. Beta blocker increased at last office visit for increased blood pressure and palpitations. Repeat monitor 5/17 showed sinus with pvcs and SVT (probable atrial flutter). Since last seen, He denies dyspnea, chest pain or syncope. He occasionally has palpitations. He did have an episode of near syncope that was short-lived. His symptoms are overall improved.  Current Outpatient Prescriptions  Medication Sig Dispense Refill  . celecoxib (CELEBREX) 200 MG capsule Take 200 mg by mouth 2 (two) times a week.     . Cholecalciferol (VITAMIN D PO) Take 2,000 mg by mouth daily.     . lansoprazole (PREVACID) 30 MG capsule Take 30 mg by mouth daily as needed (heart  burn).    . losartan (COZAAR) 50 MG tablet Take 0.5 tablets (25 mg total) by mouth daily. 90 tablet 0  . metoprolol (LOPRESSOR) 50 MG tablet TAKE 1/2 TABLET TWICE DAILY 30 tablet 2  . metoprolol succinate (TOPROL-XL) 50 MG 24 hr tablet ONE TABLET IN THE MORNING AND 1/2 TABLET IN THE EVENING (Patient taking differently: Take 50 mg by mouth 2 (two) times daily. ) 60 tablet 6  . polycarbophil (FIBERCON) 625 MG tablet Take 625 mg by mouth daily as needed for mild constipation.    . potassium citrate (UROCIT-K) 10 MEQ (1080 MG) SR tablet Take 10 mEq by mouth 2 (two) times daily.      No current facility-administered medications for this visit.     Past Medical History  Diagnosis  Date  . Hypertension   . History of paroxysmal supraventricular tachycardia     [name]  . Vertigo, benign positional     [name]  . Palpitations     History of ventricular ectopy. He is status post radiofrequency ablation at the [facility] in the early 90's. for PVC's  . BPH (benign prostatic hyperplasia)     s/p TURP in July 2012  . Aortic sclerosis ([place])     MILD PER ECHO AS WELL AS TRACE MITRAL REGURGITATION - PER CARDIOLOGY OFFICE NOTES 06/2012  . EKG abnormalities     INCOMPLETE RIGHT BUNDLE BRANCH BLOCK AND CHRONIC T-WAVE ABNORMALITIES - PER CARDIOLOGY OFFICE NOTES 06/2012  . Complication of anesthesia     HX OF MINOR ITCHING AFTER ANESTHESIA FOR HIP REPLACEMENT AND FOR COLON SURGERY  . GERD (gastroesophageal reflux disease)   . Arthritis     Past Surgical History  Procedure Laterality Date  . Hernia repair  1990  . Ankle surgery    . Tonsillectomy  Age 44  . Kidney stone surgery    . US echocardiography  06-20-2009    Est EF 55-60%  . Cardiac catheterization  2002    NORMAL CORONARY ARTERIES  . Joint replacement  ? 2009    LEFT HIP REPLACEMENT  . Colon surgery  ? 1994  . Transurethral resection of prostate    .  Total knee arthroplasty Bilateral 09/13/2012    Procedure: TOTAL KNEE BILATERAL;  Surgeon: Mauri Pole, MD;  Location: [facility];  Service: Orthopedics;  Laterality: Bilateral;  . Right shoulder rotator cuff repair    . Total hip arthroplasty Right 08/04/2013    Procedure: RIGHT TOTAL HIP ARTHROPLASTY ANTERIOR APPROACH;  Surgeon: Mauri Pole, MD;  Location: [facility];  Service: Orthopedics;  Laterality: Right;    Social History   Social History  . Marital Status: Married    Spouse Name: N/A  . Number of Children: N/A  . Years of Education: N/A   Occupational History  . Not on file.   Social History Main Topics  . Smoking status: Never Smoker   . Smokeless tobacco:  Not on file  . Alcohol Use: 0.0 oz/week    0 Standard drinks or equivalent per week     Comment: Drink Wine almost daily  . Drug Use: No  . Sexual Activity: Not on file   Other Topics Concern  . Not on file   Social History Narrative    Family History  Problem Relation Age of Onset  . Other Neg Hx   . Heart attack Mother     ROS: no fevers or chills, productive cough, hemoptysis, dysphasia, odynophagia, melena, hematochezia, dysuria, hematuria, rash, seizure activity, orthopnea, PND, pedal edema, claudication. Remaining systems are negative.  Physical Exam: Well-developed well-nourished in no acute distress.  Skin is warm and dry.  HEENT is normal.  Neck is supple.  Chest is clear to auscultation with normal expansion.  Cardiovascular exam is regular rate and rhythm.  Abdominal exam nontender or distended. No masses palpated. Extremities show no edema. neuro grossly intact   Assessment and plan 1 atrial flutter-Monitor shows probable atrial flutter although not definitive. Continue metoprolol for rate control. Repeat echocardiogram. CHADSvasc 3. Discontinue aspirin. Apixaban 5 mg twice a day. Refer to electrophysiology for consideration of ablation and avoidance of long-term anticoagulation. 2 hypertension-continue present blood pressure medications. 3 dizziness-etiology unclear. Symptoms have improved. Kirk Ruths, MD

## 2015-11-19 ENCOUNTER — Ambulatory Visit (INDEPENDENT_AMBULATORY_CARE_PROVIDER_SITE_OTHER): Payer: Medicare Other | Admitting: Cardiology

## 2015-11-19 ENCOUNTER — Encounter: Payer: Self-pay | Admitting: Cardiology

## 2015-11-19 VITALS — BP 122/78 | HR 61 | Ht 72.0 in | Wt 229.6 lb

## 2015-11-19 DIAGNOSIS — I10 Essential (primary) hypertension: Secondary | ICD-10-CM

## 2015-11-19 DIAGNOSIS — I471 Supraventricular tachycardia: Secondary | ICD-10-CM | POA: Diagnosis not present

## 2015-11-19 DIAGNOSIS — I4892 Unspecified atrial flutter: Secondary | ICD-10-CM | POA: Diagnosis not present

## 2015-11-19 MED ORDER — APIXABAN 5 MG PO TABS: 5.0000 mg | ORAL_TABLET | Freq: Two times a day (BID) | ORAL | Status: DC

## 2015-11-19 NOTE — Patient Instructions (Signed)
Medication Instructions:   STOP ASPIRIN  START ELIQUIS 5 MG ONE TABLET TWICE DAILY  Labwork:  Your physician recommends that you return for lab work in: 4 WEEKS  Testing/Procedures:  Your physician has requested that you have an echocardiogram. Echocardiography is a painless test that uses sound waves to create images of your heart. It provides your doctor with information about the size and shape of your heart and how well your heart's chambers and valves are working. This procedure takes approximately one hour. There are no restrictions for this procedure.    Follow-Up:  Your physician recommends that you schedule a follow-up appointment in: 3 MONTHS WITH DR CRENSHAW   REFERRAL TO EP TO DISCUSS ATRIAL FLUTTER ABLATION

## 2015-11-22 ENCOUNTER — Telehealth: Payer: Self-pay | Admitting: Cardiology

## 2015-11-22 NOTE — Telephone Encounter (Signed)
Returned call to patient. He noted he had a VM from our office regarding referral for ablation w/ Dr. Caryl Comes. After discussion w/ patient and review of chart, think it is likely there was a miscommunication, & that he was called to set up the office visit to see Dr. Caryl Comes for EP referral. Informed patient this is a consult only, based on our discussion and chart review he is still to undergo echocardiogram as well as have the consult before further recommendations/procedures. Will send msg to scheduling.

## 2015-11-22 NOTE — Telephone Encounter (Signed)
New message   Pt verbalized that he received a message to call and schedule an Ablation   Pt is concerned and confused, he verbalized that he don't know how he got to the point to schedule ablation  He said that Dr.Crenshaw hold him that there are other measures and test that can be done before the ablation

## 2015-11-29 ENCOUNTER — Encounter (INDEPENDENT_AMBULATORY_CARE_PROVIDER_SITE_OTHER): Payer: Self-pay

## 2015-11-29 ENCOUNTER — Other Ambulatory Visit: Payer: Self-pay

## 2015-11-29 ENCOUNTER — Ambulatory Visit (INDEPENDENT_AMBULATORY_CARE_PROVIDER_SITE_OTHER): Payer: Medicare Other | Admitting: Internal Medicine

## 2015-11-29 ENCOUNTER — Encounter: Payer: Self-pay | Admitting: Internal Medicine

## 2015-11-29 VITALS — BP 132/72 | HR 50 | Ht 72.0 in | Wt 228.2 lb

## 2015-11-29 DIAGNOSIS — I4892 Unspecified atrial flutter: Secondary | ICD-10-CM

## 2015-11-29 MED ORDER — FLECAINIDE ACETATE 100 MG PO TABS: 100.0000 mg | ORAL_TABLET | Freq: Two times a day (BID) | ORAL | Status: DC

## 2015-11-29 NOTE — Patient Instructions (Addendum)
Medication Instructions: - Your physician has recommended you make the following change in your medication:  1) Start flecainide 100 mg one tablet by mouth twice daily  Labwork: - none  Procedures/Testing: - none  Follow-Up: - You have been referred to : Dr. Thompson Grayer- evaluate for ablation- ASAP  Any Additional Special Instructions Will Be Listed Below (If Applicable).     If you need a refill on your cardiac medications before your next appointment, please call your pharmacy.

## 2015-11-29 NOTE — Progress Notes (Signed)
ELECTROPHYSIOLOGY CONSULT NOTE  Patient ID: [name] [name], MRN: [id number], DOB/AGE: January 29, 1936 80 y.o. Admit date: (Not on file) Date of Consult: 11/29/2015  Primary Physician: Henrine Screws, MD Primary Cardiologist: [facility]  Consulting Physician [facility]  Chief Complaint: Atrial flutter   HPI [name] [name] is a 80 y.o. male  referred for consideration of treatment options for atrial flutter.   he has a history of PVCs for which he underwent catheter ablation of the [facility] clinic 1990s. At the early part of this century. That did well for a number of years but recently he has had more problems with PVCs; they bother  Him hardly at all. Over the last 3-4 years, he has noted abrupt onset tachypalpitations of relatively short duration seconds-minutes. They have been more recently associated with profound lightheadedness and presyncope. On one occasion his blood pressures recorded by physician   friend at 60/40 with a heart rate of about 180. This happened on a second occasion as well.  He saw Dr. London Sheer ordered an event recorder that demonstrated recurrent episodes of abrupt onset and offset tachycardia. They were interpreted as atrial flutter and he was started on apixoban.  He has no antecedent history of syncope Catheterization 2001 demonstrated normal coronary arteries  Echocardiogram 2011 mild LVH and a nuclear stress test 7/14 EF 59% immune.   He has noted some gait instability following his lower extremity joint replacement surgery  He drinks >=3+ drinks day      Past Medical History  Diagnosis Date  . Hypertension   . History of paroxysmal supraventricular tachycardia     [name]  . Vertigo, benign positional     [name]  . Palpitations     History of ventricular ectopy. He is status post radiofrequency ablation at the [facility] in the early 90's. for PVC's  . BPH (benign  prostatic hyperplasia)     s/p TURP in July 2012  . Aortic sclerosis ([place])     MILD PER ECHO AS WELL AS TRACE MITRAL REGURGITATION - PER CARDIOLOGY OFFICE NOTES 06/2012  . EKG abnormalities     INCOMPLETE RIGHT BUNDLE BRANCH BLOCK AND CHRONIC T-WAVE ABNORMALITIES - PER CARDIOLOGY OFFICE NOTES 06/2012  . Complication of anesthesia     HX OF MINOR ITCHING AFTER ANESTHESIA FOR HIP REPLACEMENT AND FOR COLON SURGERY  . GERD (gastroesophageal reflux disease)   . Arthritis       Surgical History:  Past Surgical History  Procedure Laterality Date  . Hernia repair  1990  . Ankle surgery    . Tonsillectomy  Age 58  . Kidney stone surgery    . US echocardiography  06-20-2009    Est EF 55-60%  . Cardiac catheterization  2002    NORMAL CORONARY ARTERIES  . Joint replacement  ? 2009    LEFT HIP REPLACEMENT  . Colon surgery  ? 1994  . Transurethral resection of prostate    . Total knee arthroplasty Bilateral 09/13/2012    Procedure: TOTAL KNEE BILATERAL;  Surgeon: Mauri Pole, MD;  Location: [facility];  Service: Orthopedics;  Laterality: Bilateral;  . Right shoulder rotator cuff repair    . Total hip arthroplasty Right 08/04/2013    Procedure: RIGHT TOTAL HIP ARTHROPLASTY ANTERIOR APPROACH;  Surgeon: Mauri Pole, MD;  Location: [facility];  Service: Orthopedics;  Laterality: Right;     Home Meds: Prior  to Admission medications   Medication Sig Start Date End Date Taking? Authorizing Provider  apixaban (ELIQUIS) 5 MG TABS tablet Take 1 tablet (5 mg total) by mouth 2 (two) times daily. 11/19/15  Yes Lelon Perla, MD  celecoxib (CELEBREX) 200 MG capsule Take 200 mg by mouth 2 (two) times a week.    Yes Historical Provider, MD  Cholecalciferol (VITAMIN D PO) Take 2,000 mg by mouth daily.    Yes Historical Provider, MD  lansoprazole (PREVACID) 30 MG capsule Take 30 mg by mouth daily as needed (heart  burn).   Yes Historical Provider, MD  losartan (COZAAR) 50 MG tablet Take 0.5 tablets (25 mg total) by  mouth daily. 09/12/15  Yes Lelon Perla, MD  metoprolol (LOPRESSOR) 50 MG tablet Take by mouth 2 (two) times daily. Take (1/2) tablet by mouth twice daily   Yes Historical Provider, MD  metoprolol succinate (TOPROL-XL) 50 MG 24 hr tablet Take 50 mg by mouth daily. Take one tablet by mouth in the AM and 0.5 tablet by mouth in the PM   Yes Historical Provider, MD  polycarbophil (FIBERCON) 625 MG tablet Take 625 mg by mouth daily as needed for mild constipation.   Yes Historical Provider, MD  potassium citrate (UROCIT-K) 10 MEQ (1080 MG) SR tablet Take 10 mEq by mouth 2 (two) times daily.    Yes Historical Provider, MD    Allergies:  Allergies  Allergen Reactions  . Hydrocodone-Acetaminophen Other (See Comments)    Confusion  . Oxycodone Other (See Comments)    Angry and confused  . Ramipril     Cough    Social History   Social History  . Marital Status: Married    Spouse Name: N/A  . Number of Children: N/A  . Years of Education: N/A   Occupational History  . Not on file.   Social History Main Topics  . Smoking status: Never Smoker   . Smokeless tobacco: Not on file  . Alcohol Use: 0.0 oz/week    0 Standard drinks or equivalent per week     Comment: Drink Wine almost daily  . Drug Use: No  . Sexual Activity: Not on file   Other Topics Concern  . Not on file   Social History Narrative     Family History  Problem Relation Age of Onset  . Other Neg Hx   . Heart attack Mother      ROS:  Please see the history of present illness.     All other systems reviewed and negative.    Physical Exam:   Blood pressure 132/72, pulse 50, height 6' (1.829 m), weight 228 lb 3.2 oz (103.511 kg), SpO2 95 %. General: Well developed, well nourished male in no acute distress. Head: Normocephalic, atraumatic, sclera non-icteric, no xanthomas, nares are without discharge. EENT: normal  Lymph Nodes:  none Neck: Negative for carotid bruits. JVD not elevated. Back:without scoliosis  kyphosis  Lungs: Clear bilaterally to auscultation without wheezes, rales, or rhonchi. Breathing is unlabored. Heart: RRR with S1 S2. No  murmur . No rubs, or gallops appreciated. Abdomen: Soft, non-tender, non-distended with normoactive bowel sounds. No hepatomegaly. No rebound/guarding. No obvious abdominal masses. Msk:  Strength and tone appear normal for age. Extremities: No clubbing or cyanosis. No edema.  Distal pedal pulses are 2+ and equal bilaterally. Skin: Warm and Dry Neuro: Alert and oriented X 3. CN III-XII intact Grossly normal sensory and motor function . Wide-based gait Psych:  Responds to questions  appropriately with a normal affect.      Labs: Cardiac Enzymes No results for input(s): CKTOTAL, CKMB, TROPONINI in the last 72 hours. CBC Lab Results  Component Value Date   WBC 6.5 08/07/2015   HGB 15.8 08/07/2015   HCT 47.0 08/07/2015   MCV 91.1 08/07/2015   PLT 171 08/07/2015   PROTIME: No results for input(s): LABPROT, INR in the last 72 hours. Chemistry No results for input(s): NA, K, CL, CO2, BUN, CREATININE, CALCIUM, PROT, BILITOT, ALKPHOS, ALT, AST, GLUCOSE in the last 168 hours.  Invalid input(s): LABALBU Lipids No results found for: CHOL, HDL, LDLCALC, TRIG BNP No results found for: PROBNP Thyroid Function Tests: No results for input(s): TSH, T4TOTAL, T3FREE, THYROIDAB in the last 72 hours.  Invalid input(s): FREET3 Miscellaneous Lab Results  Component Value Date   DDIMER 1.58* 09/18/2012    Radiology/Studies:  No results found.  EKG: sinus    Assessment and Plan:  Atrial tachycardia  Presyncope   PVCs  Hypertension  Gait instability  EtOH use   The patient has had recurrent SVT with presyncope associated with documented profound hypotension. I do not think the rhythm is atrial flutter. There is discrete P waves at his initiation, there is acceleration, and they're relatively brief episodes all more consistent with atrial  tachycardia. I suspect the atrial arrhythmia is triggering a neurally mediated hypotensive reflex.  I have suggested that he consider catheter ablation and we will arrange for consultation with Dr. Rayann Heman    in the interim, the absence of his coronary disease we will start him on flecainide 100 mg twice daily in conjunction with his beta blockers  He has an echocardiogram scheduled  I've encouraged him to work on his balance and decrease EtOH intake           Virl Axe

## 2015-11-30 ENCOUNTER — Telehealth: Payer: Self-pay | Admitting: Internal Medicine

## 2015-11-30 NOTE — Telephone Encounter (Signed)
New message      Pt was seen yesterday.  Talk to a nurse about his medication list he received

## 2015-11-30 NOTE — Telephone Encounter (Signed)
Pt needed  Clarification re  Metoprolol succ and  Metoprolol tart ./CY

## 2015-12-05 ENCOUNTER — Ambulatory Visit (HOSPITAL_COMMUNITY): Payer: Medicare Other | Attending: Cardiovascular Disease

## 2015-12-05 ENCOUNTER — Other Ambulatory Visit: Payer: Self-pay

## 2015-12-05 DIAGNOSIS — I119 Hypertensive heart disease without heart failure: Secondary | ICD-10-CM | POA: Diagnosis not present

## 2015-12-05 DIAGNOSIS — I4892 Unspecified atrial flutter: Secondary | ICD-10-CM | POA: Diagnosis not present

## 2015-12-05 DIAGNOSIS — I351 Nonrheumatic aortic (valve) insufficiency: Secondary | ICD-10-CM | POA: Diagnosis not present

## 2015-12-05 DIAGNOSIS — Z8249 Family history of ischemic heart disease and other diseases of the circulatory system: Secondary | ICD-10-CM | POA: Insufficient documentation

## 2015-12-06 DIAGNOSIS — K219 Gastro-esophageal reflux disease without esophagitis: Secondary | ICD-10-CM | POA: Diagnosis not present

## 2015-12-06 DIAGNOSIS — E559 Vitamin D deficiency, unspecified: Secondary | ICD-10-CM | POA: Diagnosis not present

## 2015-12-06 DIAGNOSIS — Z0001 Encounter for general adult medical examination with abnormal findings: Secondary | ICD-10-CM | POA: Diagnosis not present

## 2015-12-06 DIAGNOSIS — J309 Allergic rhinitis, unspecified: Secondary | ICD-10-CM | POA: Diagnosis not present

## 2015-12-06 DIAGNOSIS — G4762 Sleep related leg cramps: Secondary | ICD-10-CM | POA: Diagnosis not present

## 2015-12-06 DIAGNOSIS — Z86010 Personal history of colonic polyps: Secondary | ICD-10-CM | POA: Diagnosis not present

## 2015-12-06 DIAGNOSIS — Z1389 Encounter for screening for other disorder: Secondary | ICD-10-CM | POA: Diagnosis not present

## 2015-12-06 DIAGNOSIS — H9313 Tinnitus, bilateral: Secondary | ICD-10-CM | POA: Diagnosis not present

## 2015-12-06 DIAGNOSIS — Z79899 Other long term (current) drug therapy: Secondary | ICD-10-CM | POA: Diagnosis not present

## 2015-12-06 DIAGNOSIS — M179 Osteoarthritis of knee, unspecified: Secondary | ICD-10-CM | POA: Diagnosis not present

## 2015-12-06 DIAGNOSIS — I6529 Occlusion and stenosis of unspecified carotid artery: Secondary | ICD-10-CM | POA: Diagnosis not present

## 2015-12-06 DIAGNOSIS — Z125 Encounter for screening for malignant neoplasm of prostate: Secondary | ICD-10-CM | POA: Diagnosis not present

## 2015-12-06 DIAGNOSIS — E041 Nontoxic single thyroid nodule: Secondary | ICD-10-CM | POA: Diagnosis not present

## 2015-12-06 DIAGNOSIS — I10 Essential (primary) hypertension: Secondary | ICD-10-CM | POA: Diagnosis not present

## 2015-12-17 ENCOUNTER — Encounter (INDEPENDENT_AMBULATORY_CARE_PROVIDER_SITE_OTHER): Payer: Self-pay

## 2015-12-17 ENCOUNTER — Ambulatory Visit (INDEPENDENT_AMBULATORY_CARE_PROVIDER_SITE_OTHER): Payer: Medicare Other | Admitting: Internal Medicine

## 2015-12-17 ENCOUNTER — Encounter: Payer: Self-pay | Admitting: Internal Medicine

## 2015-12-17 VITALS — BP 144/84 | HR 57 | Ht 72.0 in | Wt 229.8 lb

## 2015-12-17 DIAGNOSIS — I471 Supraventricular tachycardia: Secondary | ICD-10-CM

## 2015-12-17 DIAGNOSIS — I4892 Unspecified atrial flutter: Secondary | ICD-10-CM

## 2015-12-17 LAB — CBC
HEMATOCRIT: 46.3 % (ref 38.5–50.0)
Hemoglobin: 15.8 g/dL (ref 13.2–17.1)
MCH: 31.2 pg (ref 27.0–33.0)
MCHC: 34.1 g/dL (ref 32.0–36.0)
MCV: 91.5 fL (ref 80.0–100.0)
MPV: 9.3 fL (ref 7.5–12.5)
PLATELETS: 174 10*3/uL (ref 140–400)
RBC: 5.06 MIL/uL (ref 4.20–5.80)
RDW: 13.6 % (ref 11.0–15.0)
WBC: 5.4 10*3/uL (ref 3.8–10.8)

## 2015-12-17 LAB — BASIC METABOLIC PANEL
BUN: 20 mg/dL (ref 7–25)
CALCIUM: 9.1 mg/dL (ref 8.6–10.3)
CO2: 28 mmol/L (ref 20–31)
CREATININE: 1.2 mg/dL — AB (ref 0.70–1.18)
Chloride: 104 mmol/L (ref 98–110)
GLUCOSE: 113 mg/dL — AB (ref 65–99)
Potassium: 4.5 mmol/L (ref 3.5–5.3)
Sodium: 140 mmol/L (ref 135–146)

## 2015-12-17 LAB — TSH: TSH: 2.08 m[IU]/L (ref 0.40–4.50)

## 2015-12-17 MED ORDER — FLECAINIDE ACETATE 100 MG PO TABS: 50.0000 mg | ORAL_TABLET | Freq: Two times a day (BID) | ORAL | 6 refills | Status: DC

## 2015-12-17 NOTE — Progress Notes (Signed)
Electrophysiology Office Note   Date:  12/17/2015   ID:  [name] [name], DOB 05/11/1936, MRN [id number]  PCP:  Henrine Screws, MD  Cardiologist:  Dr Stanford Breed Primary Electrophysiologist:  Dr Caryl Comes   CC: SVT   History of Present Illness: [name] [name] is a 80 y.o. male who presents today for electrophysiology evaluation.   He has a h/o prior ablation for PVCs at [facility] in the 1990s.  Recently, he was evaluated by Dr Stanford Breed for an episode of tachypalpitations with hypotension and presyncope.  He had an event monitor placed (reviewed) which revealed episodes of SVT.  These were mostly quite short.  The patient was evaluated by Dr Caryl Comes and placed on flecainide.  He has done "great" since that time.  Interestingly, he has noticed that his PVCs are also "much better" with flecainide.  He is tolerating the medicine without side effects.  Today, he denies symptoms of chest pain, shortness of breath, orthopnea, PND, lower extremity edema, claudication, dizziness, presyncope, syncope, bleeding, or neurologic sequela. The patient is tolerating medications without difficulties and is otherwise without complaint today.    Past Medical History:  Diagnosis Date  . Aortic sclerosis ([place])    MILD PER ECHO AS WELL AS TRACE MITRAL REGURGITATION - PER CARDIOLOGY OFFICE NOTES 06/2012  . Arthritis   . BPH (benign prostatic hyperplasia)    s/p TURP in July 2012  . Complication of anesthesia    HX OF MINOR ITCHING AFTER ANESTHESIA FOR HIP REPLACEMENT AND FOR COLON SURGERY  . EKG abnormalities    INCOMPLETE RIGHT BUNDLE BRANCH BLOCK AND CHRONIC T-WAVE ABNORMALITIES - PER CARDIOLOGY OFFICE NOTES 06/2012  . GERD (gastroesophageal reflux disease)   . History of paroxysmal supraventricular tachycardia    [name]  . Hypertension   . Palpitations    History of ventricular ectopy. He is status post radiofrequency ablation at the [facility] in the early 90's. for  PVC's  . Vertigo, benign positional    [name]   Past Surgical History:  Procedure Laterality Date  . ANKLE SURGERY    . CARDIAC CATHETERIZATION  2002   NORMAL CORONARY ARTERIES  . COLON SURGERY  ? 1994  . HERNIA REPAIR  1990  . JOINT REPLACEMENT  ? 2009   LEFT HIP REPLACEMENT  . KIDNEY STONE SURGERY    . right shoulder rotator cuff repair    . TONSILLECTOMY  Age 73  . TOTAL HIP ARTHROPLASTY Right 08/04/2013   Procedure: RIGHT TOTAL HIP ARTHROPLASTY ANTERIOR APPROACH;  Surgeon: Mauri Pole, MD;  Location: [facility];  Service: Orthopedics;  Laterality: Right;  . TOTAL KNEE ARTHROPLASTY Bilateral 09/13/2012   Procedure: TOTAL KNEE BILATERAL;  Surgeon: Mauri Pole, MD;  Location: [facility];  Service: Orthopedics;  Laterality: Bilateral;  . TRANSURETHRAL RESECTION OF PROSTATE    . US ECHOCARDIOGRAPHY  06-20-2009   Est EF 55-60%     Current Outpatient Prescriptions  Medication Sig Dispense Refill  . celecoxib (CELEBREX) 200 MG capsule Take 200 mg by mouth 2 (two) times a week.     . Cholecalciferol (VITAMIN D PO) Take 2,000 mg by mouth daily.     . flecainide (TAMBOCOR) 100 MG tablet Take 0.5 tablets (50 mg total) by mouth 2 (two) times daily. 60 tablet 6  . lansoprazole (PREVACID) 30 MG capsule Take 30 mg by mouth daily as needed (heart  burn).    Marland Kitchen  losartan (COZAAR) 50 MG tablet Take 0.5 tablets (25 mg total) by mouth daily. 90 tablet 0  . metoprolol (LOPRESSOR) 50 MG tablet Take by mouth as needed. 25 - 50 MG  IF HEART RATE  GREATER  THAN  90     . metoprolol succinate (TOPROL-XL) 50 MG 24 hr tablet Take 50 mg by mouth daily. Take one tablet by mouth in the AM and 0.5 tablet by mouth in the PM    . polycarbophil (FIBERCON) 625 MG tablet Take 625 mg by mouth daily as needed for mild constipation.    . potassium citrate (UROCIT-K) 10 MEQ (1080 MG) SR tablet Take 10 mEq by mouth 2 (two) times daily.      No current  facility-administered medications for this visit.     Allergies:   Hydrocodone-acetaminophen; Oxycodone; and Ramipril   Social History:  The patient  reports that he has never smoked. He does not have any smokeless tobacco history on file. He reports that he drinks alcohol. He reports that he does not use drugs.   Family History:  The patient's  family history includes Heart attack in his mother.    ROS:  Please see the history of present illness.   All other systems are reviewed and negative.    PHYSICAL EXAM: VS:  BP (!) 144/84 (BP Location: Left Arm, Patient Position: Sitting, Cuff Size: Normal)   Pulse (!) 57   Ht 6' (1.829 m)   Wt 229 lb 12.8 oz (104.2 kg)   BMI 31.17 kg/m  , BMI Body mass index is 31.17 kg/m. GEN: Well nourished, well developed, in no acute distress  HEENT: normal  Neck: no JVD, carotid bruits, or masses Cardiac: RRR; no murmurs, rubs, or gallops,no edema  Respiratory:  clear to auscultation bilaterally, normal work of breathing GI: soft, nontender, nondistended, + BS MS: no deformity or atrophy  Skin: warm and dry  Neuro:  Strength and sensation are intact Psych: euthymic mood, full affect  EKG:  EKG is ordered today. The ekg ordered today shows sinus rhythm 57 bpm, PR 210 msec, incomplete RBBB, nonspecific ST/T changes.  11/29/15 ekg is not currently scanned into epic for review.  08/09/15 echo reveals incomplete RBBB at that time however first degree AV block is new.   Recent Labs: 12/17/2015: BUN 20; Creat 1.20; Hemoglobin 15.8; Platelets 174; Potassium 4.5; Sodium 140; TSH 2.08    Lipid Panel  No results found for: CHOL, TRIG, HDL, CHOLHDL, VLDL, LDLCALC, LDLDIRECT   Wt Readings from Last 3 Encounters:  12/17/15 229 lb 12.8 oz (104.2 kg)  11/29/15 228 lb 3.2 oz (103.5 kg)  11/19/15 229 lb 9.6 oz (104.1 kg)      Other studies Reviewed: Additional studies/ records that were reviewed today include: Drs Caryl Comes and Crenshaw's notes are reviewed,  event monitor is reviewed  Review of the above records today demonstrates: as above   ASSESSMENT AND PLAN:  1.  SVT Review of event monitor reveals SVT.  I suspect ectopic atrial tachycardia but cannot exclude a reentrant arrhythmia. Therapeutic strategies for supraventricular tachycardia including medicine and ablation were discussed in detail with the patient today. Risk, benefits, and alternatives to EP study and radiofrequency ablation were also discussed in detail today.  At this time, he feels well with flecainide and is clear that he would like to continue this approach rather than proceed with ablation.  Given first degree AV block, I will reduce flecainide to 50mg  BID.  IF he has  no further SVT upon follow-up with Dr Stanford Breed, ETT would be reasonable at that time to evaluate for flecainide induced arrhythmias.  Return to see me in 6 months unless problems arise in the interim.  Current medicines are reviewed at length with the patient today.   The patient does not have concerns regarding his medicines.  The following changes were made today:  none  Labs/ tests ordered today include:  Orders Placed This Encounter  Procedures  . EKG 12-Lead     Signed, Thompson Grayer, MD  12/17/2015 10:18 PM     [address] [place]  [place] [id number] [phone] (office) [phone] (fax)

## 2015-12-17 NOTE — Patient Instructions (Signed)
Medication Instructions:  Your physician has recommended you make the following change in your medication:  1) Decrease Flecainide to 50 mg twice daily   Labwork: None ordered   Testing/Procedures: None ordered   Follow-Up: Your physician wants you to follow-up in: 6 months with Dr Rayann Heman Dennis Bast will receive a reminder letter in the mail two months in advance. If you don't receive a letter, please call our office to schedule the follow-up appointment.   Any Other Special Instructions Will Be Listed Below (If Applicable).     If you need a refill on your cardiac medications before your next appointment, please call your pharmacy.

## 2015-12-20 DIAGNOSIS — L570 Actinic keratosis: Secondary | ICD-10-CM | POA: Diagnosis not present

## 2015-12-20 DIAGNOSIS — D225 Melanocytic nevi of trunk: Secondary | ICD-10-CM | POA: Diagnosis not present

## 2015-12-20 DIAGNOSIS — L821 Other seborrheic keratosis: Secondary | ICD-10-CM | POA: Diagnosis not present

## 2015-12-20 DIAGNOSIS — Z85828 Personal history of other malignant neoplasm of skin: Secondary | ICD-10-CM | POA: Diagnosis not present

## 2015-12-20 DIAGNOSIS — D1801 Hemangioma of skin and subcutaneous tissue: Secondary | ICD-10-CM | POA: Diagnosis not present

## 2015-12-20 DIAGNOSIS — L814 Other melanin hyperpigmentation: Secondary | ICD-10-CM | POA: Diagnosis not present

## 2015-12-20 DIAGNOSIS — L812 Freckles: Secondary | ICD-10-CM | POA: Diagnosis not present

## 2015-12-20 DIAGNOSIS — C44311 Basal cell carcinoma of skin of nose: Secondary | ICD-10-CM | POA: Diagnosis not present

## 2015-12-27 DIAGNOSIS — Z96652 Presence of left artificial knee joint: Secondary | ICD-10-CM | POA: Diagnosis not present

## 2015-12-27 DIAGNOSIS — M25561 Pain in right knee: Secondary | ICD-10-CM | POA: Diagnosis not present

## 2015-12-27 DIAGNOSIS — Z96651 Presence of right artificial knee joint: Secondary | ICD-10-CM | POA: Diagnosis not present

## 2015-12-27 DIAGNOSIS — Z471 Aftercare following joint replacement surgery: Secondary | ICD-10-CM | POA: Diagnosis not present

## 2015-12-27 DIAGNOSIS — Z96653 Presence of artificial knee joint, bilateral: Secondary | ICD-10-CM | POA: Diagnosis not present

## 2015-12-27 DIAGNOSIS — M79672 Pain in left foot: Secondary | ICD-10-CM | POA: Diagnosis not present

## 2015-12-31 DIAGNOSIS — N200 Calculus of kidney: Secondary | ICD-10-CM | POA: Diagnosis not present

## 2015-12-31 DIAGNOSIS — N400 Enlarged prostate without lower urinary tract symptoms: Secondary | ICD-10-CM | POA: Diagnosis not present

## 2016-01-02 ENCOUNTER — Other Ambulatory Visit: Payer: Self-pay

## 2016-01-02 MED ORDER — LOSARTAN POTASSIUM 50 MG PO TABS: 25.0000 mg | ORAL_TABLET | Freq: Every day | ORAL | 0 refills | Status: DC

## 2016-01-08 DIAGNOSIS — M25561 Pain in right knee: Secondary | ICD-10-CM | POA: Diagnosis not present

## 2016-01-29 ENCOUNTER — Encounter: Payer: Self-pay | Admitting: Cardiology

## 2016-02-10 NOTE — Progress Notes (Signed)
HPI: FU SVT and hypertension. He has a past history of having had ablation for frequent PVCs done at the [facility] clinic in the early 1990s. He had a cardiac catheterization in 2001 showing normal coronary arteries. His most recent nuclear stress test was 12/16/12 showing an ejection fraction of 59% and no ischemia or wall motion abnormalities. He does have a chronically abnormal electrocardiogram with abnormal T waves. Repeat monitor 5/17 showed sinus with pvcs and SVT. Echo 7/17 showed normal LV function; grade 1 DD and trace AI. Seen by Dr Caryl Comes and placed on flecanide for atrial tach with improvement in symptoms; seen by Dr Rayann Heman for possible ablation but pt preferred medical therapy. Since last seen, his palpitations have resolved. He denies exertional chest pain, dyspnea or syncope.  Current Outpatient Prescriptions  Medication Sig Dispense Refill  . celecoxib (CELEBREX) 200 MG capsule Take 200 mg by mouth 2 (two) times a week.     . Cholecalciferol (VITAMIN D PO) Take 2,000 mg by mouth daily.     . flecainide (TAMBOCOR) 100 MG tablet Take 0.5 tablets (50 mg total) by mouth 2 (two) times daily. 60 tablet 6  . lansoprazole (PREVACID) 30 MG capsule Take 30 mg by mouth daily as needed (heart  burn).    . losartan (COZAAR) 50 MG tablet Take 0.5 tablets (25 mg total) by mouth daily. 90 tablet 0  . metoprolol (LOPRESSOR) 50 MG tablet Take by mouth as needed. 25 - 50 MG  IF HEART RATE  GREATER  THAN  90     . metoprolol succinate (TOPROL-XL) 50 MG 24 hr tablet Take 50 mg by mouth daily. Take one tablet by mouth in the AM and 0.5 tablet by mouth in the PM    . polycarbophil (FIBERCON) 625 MG tablet Take 625 mg by mouth daily as needed for mild constipation.    . potassium citrate (UROCIT-K) 10 MEQ (1080 MG) SR tablet Take 10 mEq by mouth 2 (two) times daily.      No current facility-administered medications for this visit.      Past Medical History:  Diagnosis Date  . Aortic sclerosis  ([place])    MILD PER ECHO AS WELL AS TRACE MITRAL REGURGITATION - PER CARDIOLOGY OFFICE NOTES 06/2012  . Arthritis   . BPH (benign prostatic hyperplasia)    s/p TURP in July 2012  . Complication of anesthesia    HX OF MINOR ITCHING AFTER ANESTHESIA FOR HIP REPLACEMENT AND FOR COLON SURGERY  . EKG abnormalities    INCOMPLETE RIGHT BUNDLE BRANCH BLOCK AND CHRONIC T-WAVE ABNORMALITIES - PER CARDIOLOGY OFFICE NOTES 06/2012  . GERD (gastroesophageal reflux disease)   . History of paroxysmal supraventricular tachycardia    [name]  . Hypertension   . Palpitations    History of ventricular ectopy. He is status post radiofrequency ablation at the [facility] in the early 90's. for PVC's  . Vertigo, benign positional    [name]    Past Surgical History:  Procedure Laterality Date  . ANKLE SURGERY    . CARDIAC CATHETERIZATION  2002   NORMAL CORONARY ARTERIES  . COLON SURGERY  ? 1994  . HERNIA REPAIR  1990  . JOINT REPLACEMENT  ? 2009   LEFT HIP REPLACEMENT  . KIDNEY STONE SURGERY    . right shoulder rotator cuff repair    . TONSILLECTOMY  Age 80  .  TOTAL HIP ARTHROPLASTY Right 08/04/2013   Procedure: RIGHT TOTAL HIP ARTHROPLASTY ANTERIOR APPROACH;  Surgeon: Mauri Pole, MD;  Location: [facility];  Service: Orthopedics;  Laterality: Right;  . TOTAL KNEE ARTHROPLASTY Bilateral 09/13/2012   Procedure: TOTAL KNEE BILATERAL;  Surgeon: Mauri Pole, MD;  Location: [facility];  Service: Orthopedics;  Laterality: Bilateral;  . TRANSURETHRAL RESECTION OF PROSTATE    . US ECHOCARDIOGRAPHY  06-20-2009   Est EF 55-60%    Social History   Social History  . Marital status: Married    Spouse name: N/A  . Number of children: N/A  . Years of education: N/A   Occupational History  . Not on file.   Social History Main Topics  . Smoking status: Never Smoker  . Smokeless tobacco: Never Used  . Alcohol use 0.0 oz/week       Comment: Drink Wine almost daily  . Drug use: No  . Sexual activity: Not on file   Other Topics Concern  . Not on file   Social History Narrative  . No narrative on file    Family History  Problem Relation Age of Onset  . Heart attack Mother   . Other Neg Hx     ROS: no fevers or chills, productive cough, hemoptysis, dysphasia, odynophagia, melena, hematochezia, dysuria, hematuria, rash, seizure activity, orthopnea, PND, pedal edema, claudication. Remaining systems are negative.  Physical Exam: Well-developed well-nourished in no acute distress.  Skin is warm and dry.  HEENT is normal.  Neck is supple.  Chest is clear to auscultation with normal expansion.  Cardiovascular exam is regular rate and rhythm.  Abdominal exam nontender or distended. No masses palpated. Extremities show no edema. neuro grossly intact   A/P  1 Atrial tachycardia-much improved with the addition of flecainide. We will continue. Continue metoprolol. Arrange exercise treadmill to exclude exercise-induced ventricular tachycardia given initiation of flecanide. Can consider ablation in the future if symptoms recur.   2 hypertension-blood pressure controlled. Continue present medications.  3 dizziness- symptoms have resolved. No plans for further evaluation at this point.  Kirk Ruths, MD

## 2016-02-12 ENCOUNTER — Ambulatory Visit (INDEPENDENT_AMBULATORY_CARE_PROVIDER_SITE_OTHER): Payer: Medicare Other | Admitting: Cardiology

## 2016-02-12 ENCOUNTER — Encounter: Payer: Self-pay | Admitting: Cardiology

## 2016-02-12 VITALS — BP 120/60 | HR 64 | Ht 72.0 in | Wt 230.0 lb

## 2016-02-12 DIAGNOSIS — I471 Supraventricular tachycardia: Secondary | ICD-10-CM | POA: Diagnosis not present

## 2016-02-12 NOTE — Patient Instructions (Signed)
Medication Instructions:   NO CHANGE  Testing/Procedures:  Your physician has requested that you have an exercise tolerance test. For further information please visit [email or web address]. Please also follow instruction sheet, as given.    Follow-Up:  Your physician wants you to follow-up in: [place] will receive a reminder letter in the mail two months in advance. If you don't receive a letter, please call our office to schedule the follow-up appointment.   If you need a refill on your cardiac medications before your next appointment, please call your pharmacy.    Exercise Stress Electrocardiogram An exercise stress electrocardiogram is a test to check how blood flows to your heart. It is done to find areas of poor blood flow. You will need to walk on a treadmill for this test. The electrocardiogram will record your heartbeat when you are at rest and when you are exercising. BEFORE THE PROCEDURE  Do not have drinks with caffeine or foods with caffeine for 24 hours before the test, or as told by your doctor. This includes coffee, tea (even decaf tea), sodas, chocolate, and cocoa.  Follow your doctor's instructions about eating and drinking before the test.  Ask your doctor what medicines you should or should not take before the test. Take your medicines with water unless told by your doctor not to.  If you use an inhaler, bring it with you to the test.  Bring a snack to eat after the test.  Do not  smoke for 4 hours before the test.  Do not put lotions, powders, creams, or oils on your chest before the test.  Wear comfortable shoes and clothing. PROCEDURE  You will have patches put on your chest. Small areas of your chest may need to be shaved. Wires will be connected to the patches.  Your heart rate will be watched while you are resting and while you are exercising.  You will walk on the treadmill. The treadmill will slowly get faster to raise your heart  rate.  The test will take about 1-2 hours. AFTER THE PROCEDURE  Your heart rate and blood pressure will be watched after the test.  You may return to your normal diet, activities, and medicines or as told by your doctor.   This information is not intended to replace advice given to you by your health care provider. Make sure you discuss any questions you have with your health care provider.   Document Released: 10/22/2007 Document Revised: 05/26/2014 Document Reviewed: 01/10/2013 Elsevier Interactive Patient Education [employer].

## 2016-02-13 DIAGNOSIS — T8484XD Pain due to internal orthopedic prosthetic devices, implants and grafts, subsequent encounter: Secondary | ICD-10-CM | POA: Diagnosis not present

## 2016-02-13 DIAGNOSIS — Z96653 Presence of artificial knee joint, bilateral: Secondary | ICD-10-CM | POA: Diagnosis not present

## 2016-02-14 ENCOUNTER — Telehealth (HOSPITAL_COMMUNITY): Payer: Self-pay

## 2016-02-14 ENCOUNTER — Telehealth: Payer: Self-pay | Admitting: Cardiology

## 2016-02-14 NOTE — Telephone Encounter (Signed)
Please call,did not give any details.

## 2016-02-14 NOTE — Telephone Encounter (Signed)
I will call pt next Tuesday to review his instructions with him one more time prior to his appointment.  Encounter complete.

## 2016-02-14 NOTE — Telephone Encounter (Signed)
Spoke with pt, he reports Estill Bamberg had called him back already. Nothing needed at this time.

## 2016-02-20 ENCOUNTER — Telehealth (HOSPITAL_COMMUNITY): Payer: Self-pay

## 2016-02-20 NOTE — Telephone Encounter (Signed)
Encounter complete. 

## 2016-02-21 ENCOUNTER — Other Ambulatory Visit: Payer: Self-pay | Admitting: *Deleted

## 2016-02-21 ENCOUNTER — Ambulatory Visit (HOSPITAL_COMMUNITY)
Admission: RE | Admit: 2016-02-21 | Discharge: 2016-02-21 | Disposition: A | Discharge status: Home / Self Care | Payer: Medicare Other | Source: Ambulatory Visit | Attending: Internal Medicine | Admitting: Internal Medicine

## 2016-02-21 DIAGNOSIS — I471 Supraventricular tachycardia: Secondary | ICD-10-CM | POA: Diagnosis not present

## 2016-02-21 LAB — EXERCISE TOLERANCE TEST
CHL CUP MPHR: 142 {beats}/min
CSEPHR: 115 %
Estimated workload: 7 METS
Exercise duration (min): 6 min
Exercise duration (sec): 13 s
Peak HR: 164 {beats}/min
RPE: 18
Rest HR: 95 {beats}/min

## 2016-02-21 MED ORDER — FLECAINIDE ACETATE 50 MG PO TABS: 50.0000 mg | ORAL_TABLET | Freq: Two times a day (BID) | ORAL | 2 refills | Status: DC

## 2016-02-21 NOTE — Telephone Encounter (Signed)
Patient called and stated that he is having difficulty when trying to half the flecainide tablets. He requested that a new rx for the 50 mg tablets be sent to the pharmacy.

## 2016-03-03 DIAGNOSIS — Z23 Encounter for immunization: Secondary | ICD-10-CM | POA: Diagnosis not present

## 2016-05-02 DIAGNOSIS — R1032 Left lower quadrant pain: Secondary | ICD-10-CM | POA: Diagnosis not present

## 2016-05-08 ENCOUNTER — Other Ambulatory Visit: Payer: Self-pay

## 2016-05-08 MED ORDER — LOSARTAN POTASSIUM 50 MG PO TABS: 25.0000 mg | ORAL_TABLET | Freq: Every day | ORAL | 3 refills | Status: DC

## 2016-05-26 ENCOUNTER — Telehealth: Payer: Self-pay | Admitting: Cardiology

## 2016-05-26 ENCOUNTER — Telehealth (INDEPENDENT_AMBULATORY_CARE_PROVIDER_SITE_OTHER): Payer: Self-pay | Admitting: Orthopedic Surgery

## 2016-05-26 NOTE — Telephone Encounter (Signed)
I called patient and discussed with his dizziness he may be having some recurrent cardiac arrhythmia. Recommend following up with his medical doctor or cardiologist immediately. We will cancel his surgery at this time he will call us when he is ready to reschedule when he is asymptomatic and would plan for a regional block per patient's request.

## 2016-05-26 NOTE — Telephone Encounter (Signed)
Spoke to patient was schedule to have foot surgery tomorrow - surgery postpone because patient has bee having dizzy and equilibrium issues. Patient states when he looks up and the looks down fast becomes dizzy.   RN informed patient it maybe  Inner ear issue ,but patient would like to discuss with Dr Ardis Hughs FIRST AND IF NEEDED  CONTACT PRIMARY DOCTOR  For follOW UP.   Patient states he would lik to get surgery done as soon as possible  Because he will be going to [facility] in feb 2018 for the winter.

## 2016-05-26 NOTE — Telephone Encounter (Signed)
See copy of e-mail correspondence:   My equilibrium/dizziness has gotten worse (not better) for the last few days. Would have called sooner except I kept feeling it would go away. This has been an off-and-on problem for a few years, directly related to my many surgeries (never had it before)...eg...bi-lateral knee(s), both hips, ankle, rotator cuff, etc. Was never severe or long lasting. Has been continuous for 5 or 6 days now , in varying degrees. Mostly tolerable but sometimes severe enough I must be very careful not to crash into things or fall down. Should I not have Elta Guadeloupe Sharol Given) or my Cardiologist Kirk Ruths) or my primary physician Marcellus Scott) check me out before tomorrow's surgery? Please call me ASAP so I know how to proceed. Don't know if I should "go under" in this condition. Actually, I thought Elta Guadeloupe could simply deaden/numb this area and remove the pouch of old blood accumulation...Marland KitchenMarland KitchenAgain, to the convenience of everyone involved, need some instructions; you folks might be able to prevail on Aaron Edelman to work me in this afternoon? I await your call. Third by phone, you're closed.  Sent from my iPhone

## 2016-05-26 NOTE — Telephone Encounter (Signed)
New Message  Pt voiced wanting to know if he could be worked in to see MD-Crenshaw.  Offered PA and pt said it was okay but he's sure if I let someone know they'll work him in.  Pt is wanting to speak with nurse to see if he could be worked in instead of see an PA/APP.  Please f/u with pt

## 2016-05-27 ENCOUNTER — Ambulatory Visit (INDEPENDENT_AMBULATORY_CARE_PROVIDER_SITE_OTHER): Payer: Medicare Other | Admitting: Cardiology

## 2016-05-27 ENCOUNTER — Encounter: Payer: Self-pay | Admitting: Cardiology

## 2016-05-27 VITALS — BP 148/74 | HR 57 | Ht 72.0 in | Wt 226.0 lb

## 2016-05-27 DIAGNOSIS — R42 Dizziness and giddiness: Secondary | ICD-10-CM

## 2016-05-27 DIAGNOSIS — I471 Supraventricular tachycardia, unspecified: Secondary | ICD-10-CM

## 2016-05-27 DIAGNOSIS — I10 Essential (primary) hypertension: Secondary | ICD-10-CM

## 2016-05-27 NOTE — Progress Notes (Signed)
HPI: FU SVT and hypertension. He has a past history of having had ablation for frequent PVCs done at the [facility] clinic in the early 1990s. He had a cardiac catheterization in 2001 showing normal coronary arteries. His most recent nuclear stress test was 12/16/12 showing an ejection fraction of 59% and no ischemia or wall motion abnormalities. He does have a chronically abnormal electrocardiogram with abnormal T waves. Repeat monitor 5/17 showed sinus with pvcs and SVT. Echo 7/17 showed normal LV function; grade 1 DD and trace AI. Seen by Dr Caryl Comes and placed on flecanide for atrial tach with improvement in symptoms; seen by Dr Rayann Heman for possible ablation but pt preferred medical therapy. ETT 10/17 showed no exercise induced VT. Since last seen, there is no dyspnea, chest pain, palpitations or syncope. He is having difficulties with dizziness. This occurs with standing and looking up. He has had some problems with this chronically.  Current Outpatient Prescriptions  Medication Sig Dispense Refill  . celecoxib (CELEBREX) 200 MG capsule Take 200 mg by mouth 2 (two) times a week.     . Cholecalciferol (VITAMIN D PO) Take 2,000 mg by mouth daily.     . flecainide (TAMBOCOR) 50 MG tablet Take 1 tablet (50 mg total) by mouth 2 (two) times daily. 180 tablet 2  . lansoprazole (PREVACID) 30 MG capsule Take 30 mg by mouth daily as needed (heart  burn).    . losartan (COZAAR) 50 MG tablet Take 0.5 tablets (25 mg total) by mouth daily. 45 tablet 3  . metoprolol (LOPRESSOR) 50 MG tablet Take by mouth as needed. 25 - 50 MG  IF HEART RATE  GREATER  THAN  90     . metoprolol succinate (TOPROL-XL) 50 MG 24 hr tablet Take 50 mg by mouth daily. Take one tablet by mouth in the AM and 0.5 tablet by mouth in the PM    . polycarbophil (FIBERCON) 625 MG tablet Take 625 mg by mouth daily as needed for mild constipation.    . potassium citrate (UROCIT-K) 10 MEQ (1080 MG) SR tablet Take 10 mEq by mouth 2 (two) times  daily.      No current facility-administered medications for this visit.      Past Medical History:  Diagnosis Date  . Aortic sclerosis    MILD PER ECHO AS WELL AS TRACE MITRAL REGURGITATION - PER CARDIOLOGY OFFICE NOTES 06/2012  . Arthritis   . BPH (benign prostatic hyperplasia)    s/p TURP in July 2012  . Complication of anesthesia    HX OF MINOR ITCHING AFTER ANESTHESIA FOR HIP REPLACEMENT AND FOR COLON SURGERY  . EKG abnormalities    INCOMPLETE RIGHT BUNDLE BRANCH BLOCK AND CHRONIC T-WAVE ABNORMALITIES - PER CARDIOLOGY OFFICE NOTES 06/2012  . GERD (gastroesophageal reflux disease)   . History of paroxysmal supraventricular tachycardia    [name]  . Hypertension   . Palpitations    History of ventricular ectopy. He is status post radiofrequency ablation at the [facility] in the early 90's. for PVC's  . Vertigo, benign positional    [name]    Past Surgical History:  Procedure Laterality Date  . ANKLE SURGERY    . CARDIAC CATHETERIZATION  2002   NORMAL CORONARY ARTERIES  . COLON SURGERY  ? 1994  . HERNIA REPAIR  1990  . JOINT REPLACEMENT  ? 2009   LEFT HIP  REPLACEMENT  . KIDNEY STONE SURGERY    . right shoulder rotator cuff repair    . TONSILLECTOMY  Age 81  . TOTAL HIP ARTHROPLASTY Right 08/04/2013   Procedure: RIGHT TOTAL HIP ARTHROPLASTY ANTERIOR APPROACH;  Surgeon: Mauri Pole, MD;  Location: [facility];  Service: Orthopedics;  Laterality: Right;  . TOTAL KNEE ARTHROPLASTY Bilateral 09/13/2012   Procedure: TOTAL KNEE BILATERAL;  Surgeon: Mauri Pole, MD;  Location: [facility];  Service: Orthopedics;  Laterality: Bilateral;  . TRANSURETHRAL RESECTION OF PROSTATE    . US ECHOCARDIOGRAPHY  06-20-2009   Est EF 55-60%    Social History   Social History  . Marital status: Married    Spouse name: N/A  . Number of children: N/A  . Years of education: N/A   Occupational History  .  Not on file.   Social History Main Topics  . Smoking status: Never Smoker  . Smokeless tobacco: Never Used  . Alcohol use 0.0 oz/week     Comment: Drink Wine almost daily  . Drug use: No  . Sexual activity: Not on file   Other Topics Concern  . Not on file   Social History Narrative  . No narrative on file    Family History  Problem Relation Age of Onset  . Heart attack Mother   . Other Neg Hx     ROS: no fevers or chills, productive cough, hemoptysis, dysphasia, odynophagia, melena, hematochezia, dysuria, hematuria, rash, seizure activity, orthopnea, PND, pedal edema, claudication. Remaining systems are negative.  Physical Exam: Well-developed well-nourished in no acute distress.  Skin is warm and dry.  HEENT is normal.  Neck is supple.  Chest is clear to auscultation with normal expansion.  Cardiovascular exam is regular rate and rhythm.  Abdominal exam nontender or distended. No masses palpated. Extremities show no edema. neuro grossly intact  ECG-Sinus rhythm at a rate of 57. Cannot rule out prior septal infarct. Anterior T-wave inversion.  A/P  1 Atrial tachycardia-symptoms appear to be reasonably well controlled with flecainide and metoprolol. Previous exercise treadmill did not show exercise induced ventricular arrhythmias. We will continue medical therapy. Can consider ablation in the future if symptoms refractory.  2 hypertension-blood pressure borderline. Continue present medications and follow.  3 dizziness-etiology of symptoms unclear. I do not think likely cardiac related. I see no contraindication for him proceeding with his foot surgery which was delayed.  Kirk Ruths, MD

## 2016-05-27 NOTE — Patient Instructions (Signed)
Your physician wants you to follow-up in: 6 MONTHS WITH DR CRENSHAW You will receive a reminder letter in the mail two months in advance. If you don't receive a letter, please call our office to schedule the follow-up appointment.   If you need a refill on your cardiac medications before your next appointment, please call your pharmacy.  

## 2016-05-28 ENCOUNTER — Telehealth (INDEPENDENT_AMBULATORY_CARE_PROVIDER_SITE_OTHER): Payer: Self-pay | Admitting: Radiology

## 2016-05-28 NOTE — Telephone Encounter (Signed)
Patient calling to get surgery scheduled emergently he states per Dr. Sharol Given. He received clearance from his cardiologist. Please call back as soon as possible.

## 2016-05-30 NOTE — Telephone Encounter (Signed)
I spoke with [name]. We rescheduled his surgery for 06/10/2016 at the [facility].  All new surgery information was given.

## 2016-06-10 ENCOUNTER — Telehealth (INDEPENDENT_AMBULATORY_CARE_PROVIDER_SITE_OTHER): Payer: Self-pay

## 2016-06-10 DIAGNOSIS — R2242 Localized swelling, mass and lump, left lower limb: Secondary | ICD-10-CM | POA: Diagnosis not present

## 2016-06-10 DIAGNOSIS — G8918 Other acute postprocedural pain: Secondary | ICD-10-CM | POA: Diagnosis not present

## 2016-06-10 DIAGNOSIS — M67472 Ganglion, left ankle and foot: Secondary | ICD-10-CM | POA: Diagnosis not present

## 2016-06-10 NOTE — Telephone Encounter (Signed)
Pt had called earlier today and advised that he has surgery with Dr. Sharol Given and he had been concerned about when the block would wear off. By the time I had returned his cal this evening he states that the feeling has returned and he did not have any other concerns. Advised the pt to call with any other questions he has already been set up with post op appt.

## 2016-06-11 ENCOUNTER — Telehealth (INDEPENDENT_AMBULATORY_CARE_PROVIDER_SITE_OTHER): Payer: Self-pay | Admitting: Orthopedic Surgery

## 2016-06-11 NOTE — Telephone Encounter (Signed)
Pt wife called again stated this is for a man about 220 lbs and needs a large one. Wants it delivered to her back door

## 2016-06-11 NOTE — Telephone Encounter (Signed)
Patients wife is requesting a script for a toilet chair preferably from somewhere that will deliver  Cb#: [phone] (carolyn -wife)

## 2016-06-12 NOTE — Telephone Encounter (Signed)
Pt called again wanting a call back to to discuss his pain  [phone]

## 2016-06-12 NOTE — Telephone Encounter (Signed)
I called patient back. He advised he has been having throbbing pain. Advised that he is to continue with elevation. His surgery was two days ago, removal of ganglion cyst on ankle. He states the pain is just as bad if not worse than his total joint replacements. I advised we are more than happy to see him the office.  Advised the throbbing pain could be coming from bandage, he feels he cannot come in the office. He is currently taking old prescription of hydrocodone that is expired. Advised him to fill new rx and take that.   He is also requesting 3in1 bedside commode. Advised that his is too small for his bottom, that he is a larger man and is wondering if they come in a different size. Advised I'm not sure, but gave him [facility] number for him to call to make sure it is not what he already has. He will call us back.

## 2016-06-17 NOTE — Telephone Encounter (Signed)
Will address at appointment tomorrow.

## 2016-06-18 ENCOUNTER — Ambulatory Visit (INDEPENDENT_AMBULATORY_CARE_PROVIDER_SITE_OTHER): Payer: Medicare Other | Admitting: Orthopedic Surgery

## 2016-06-18 DIAGNOSIS — Z9889 Other specified postprocedural states: Secondary | ICD-10-CM

## 2016-06-18 NOTE — Progress Notes (Signed)
Office Visit Note   Patient: [name] [name]           Date of Birth: 1935/10/30           MRN: [id number] Visit Date: 06/18/2016              Requested by: Josetta Huddle, MD [address] [place], [place] [address] PCP: Henrine Screws, MD  No chief complaint on file.   HPI: The patient is a 81 year old gentleman seen 1 week status post excision of cyst from the dorsal left ankle. Is on a kneeling scooter with a postop shoe today. No complaints.    Assessment & Plan: Visit Diagnoses:  1. S/P excision of ganglion cyst     Plan: Him cleanse incision daily with dial soap. Apply dry dressing. Follow up in one more week for suture removal. Tinea postop shoe.  Follow-Up Instructions: Return in about 1 week (around 06/25/2016).   Ortho Exam Left ankle incision is well approximated with sutures. This is healing well. There is no drainage no surrounding erythema no odor no sign of infection. No swelling.  Imaging: No results found.  Orders:  No orders of the defined types were placed in this encounter.  No orders of the defined types were placed in this encounter.    Procedures: No procedures performed  Clinical Data: No additional findings.  Subjective: Review of Systems  Constitutional: Negative for chills and fever.    Objective: Vital Signs: There were no vitals taken for this visit.  Specialty Comments:  No specialty comments available.  PMFS History: Patient Active Problem List   Diagnosis Date Noted  . PVC (premature ventricular contraction): History of ablation many years ago 06/15/2015  . Obese 08/05/2013  . S/P right THA, AA 08/04/2013  . Dyspnea on exertion 12/14/2012  . Unspecified arthropathy, lower leg 10/19/2012  . Secondary renovascular hypertension, benign 10/19/2012  . Acute blood loss anemia 10/05/2012  . Unspecified constipation 10/05/2012  . GERD (gastroesophageal reflux disease) 10/05/2012  . Syncope and collapse  09/18/2012  . Hypotension 09/18/2012  . Confusion 09/18/2012  . CKD (chronic kidney disease), stage III 09/18/2012  . Dehydration 09/18/2012  . Expected blood loss anemia 09/15/2012  . Overweight (BMI 25.0-29.9) 09/15/2012  . S/P bilateral TKA 09/13/2012  . Benign hypertensive heart disease without heart failure 07/15/2012  . PSVT (paroxysmal supraventricular tachycardia) ([place]) 07/15/2012  . Osteoarthritis 06/24/2011  . Dizziness 01/07/2011  . HTN (hypertension) 01/07/2011   Past Medical History:  Diagnosis Date  . Aortic sclerosis    MILD PER ECHO AS WELL AS TRACE MITRAL REGURGITATION - PER CARDIOLOGY OFFICE NOTES 06/2012  . Arthritis   . BPH (benign prostatic hyperplasia)    s/p TURP in July 2012  . Complication of anesthesia    HX OF MINOR ITCHING AFTER ANESTHESIA FOR HIP REPLACEMENT AND FOR COLON SURGERY  . EKG abnormalities    INCOMPLETE RIGHT BUNDLE BRANCH BLOCK AND CHRONIC T-WAVE ABNORMALITIES - PER CARDIOLOGY OFFICE NOTES 06/2012  . GERD (gastroesophageal reflux disease)   . History of paroxysmal supraventricular tachycardia    [name]  . Hypertension   . Palpitations    History of ventricular ectopy. He is status post radiofrequency ablation at the [facility] in the early 90's. for PVC's  . Vertigo, benign positional    [name]    Family History  Problem Relation Age of Onset  .  Heart attack Mother   . Other Neg Hx     Past Surgical History:  Procedure Laterality Date  . ANKLE SURGERY    . CARDIAC CATHETERIZATION  2002   NORMAL CORONARY ARTERIES  . COLON SURGERY  ? 1994  . HERNIA REPAIR  1990  . JOINT REPLACEMENT  ? 2009   LEFT HIP REPLACEMENT  . KIDNEY STONE SURGERY    . right shoulder rotator cuff repair    . TONSILLECTOMY  Age 20  . TOTAL HIP ARTHROPLASTY Right 08/04/2013   Procedure: RIGHT TOTAL HIP ARTHROPLASTY ANTERIOR APPROACH;  Surgeon: Mauri Pole, MD;   Location: [facility];  Service: Orthopedics;  Laterality: Right;  . TOTAL KNEE ARTHROPLASTY Bilateral 09/13/2012   Procedure: TOTAL KNEE BILATERAL;  Surgeon: Mauri Pole, MD;  Location: [facility];  Service: Orthopedics;  Laterality: Bilateral;  . TRANSURETHRAL RESECTION OF PROSTATE    . US ECHOCARDIOGRAPHY  06-20-2009   Est EF 55-60%   Social History   Occupational History  . Not on file.   Social History Main Topics  . Smoking status: Never Smoker  . Smokeless tobacco: Never Used  . Alcohol use 0.0 oz/week     Comment: Drink Wine almost daily  . Drug use: No  . Sexual activity: Not on file

## 2016-06-24 ENCOUNTER — Other Ambulatory Visit: Payer: Self-pay | Admitting: Cardiology

## 2016-06-24 DIAGNOSIS — D1801 Hemangioma of skin and subcutaneous tissue: Secondary | ICD-10-CM | POA: Diagnosis not present

## 2016-06-24 DIAGNOSIS — L570 Actinic keratosis: Secondary | ICD-10-CM | POA: Diagnosis not present

## 2016-06-24 DIAGNOSIS — L438 Other lichen planus: Secondary | ICD-10-CM | POA: Diagnosis not present

## 2016-06-24 DIAGNOSIS — D692 Other nonthrombocytopenic purpura: Secondary | ICD-10-CM | POA: Diagnosis not present

## 2016-06-24 DIAGNOSIS — D225 Melanocytic nevi of trunk: Secondary | ICD-10-CM | POA: Diagnosis not present

## 2016-06-24 DIAGNOSIS — D0461 Carcinoma in situ of skin of right upper limb, including shoulder: Secondary | ICD-10-CM | POA: Diagnosis not present

## 2016-06-24 DIAGNOSIS — L821 Other seborrheic keratosis: Secondary | ICD-10-CM | POA: Diagnosis not present

## 2016-06-24 DIAGNOSIS — L814 Other melanin hyperpigmentation: Secondary | ICD-10-CM | POA: Diagnosis not present

## 2016-06-24 DIAGNOSIS — Z85828 Personal history of other malignant neoplasm of skin: Secondary | ICD-10-CM | POA: Diagnosis not present

## 2016-06-25 ENCOUNTER — Ambulatory Visit (INDEPENDENT_AMBULATORY_CARE_PROVIDER_SITE_OTHER): Payer: Medicare Other | Admitting: Orthopedic Surgery

## 2016-06-25 DIAGNOSIS — M67479 Ganglion, unspecified ankle and foot: Secondary | ICD-10-CM

## 2016-06-25 NOTE — Progress Notes (Signed)
Office Visit Note   Patient: [name] [name]           Date of Birth: December 19, 1935           MRN: [id number] Visit Date: 06/25/2016              Requested by: Josetta Huddle, MD [address] [place], [place] [address] PCP: Henrine Screws, MD  Chief Complaint  Patient presents with  . Left Foot - Routine Post Op    HPI: The patient is a 81 year old gentleman who presents today 2 weeks status post ganglion cyst excision left foot. Sutures remain in place. No concerns voiced.    Assessment & Plan: Visit Diagnoses:  1. Ganglion cyst of foot     Plan: Harvest sutures today. We will have him go to [employer] supply to obtain a pair of 15-20 mm compression knee-high stockings he will wear these 24 hours a day take them off to bathe and shower. Patient is leaving for [place] plan to follow-up when he returns from [place].  Follow-Up Instructions: Return in about 2 weeks (around 07/09/2016).   Ortho Exam Examination the incision is well-healed there is no cellulitis no drainage no signs of infection. Patient has significant pitting edema and the left lower extremity worse than the right there is brawny skin color changes from chronic venous insufficiency.  Imaging: No results found.  Orders:  No orders of the defined types were placed in this encounter.  No orders of the defined types were placed in this encounter.    Procedures: No procedures performed  Clinical Data: No additional findings.  Subjective: Review of Systems  Objective: Vital Signs: There were no vitals taken for this visit.  Specialty Comments:  No specialty comments available.  PMFS History: Patient Active Problem List   Diagnosis Date Noted  . PVC (premature ventricular contraction): History of ablation many years ago 06/15/2015  . Obese 08/05/2013  . S/P right THA, AA 08/04/2013  . Dyspnea on exertion 12/14/2012  . Unspecified arthropathy, lower leg 10/19/2012  . Secondary  renovascular hypertension, benign 10/19/2012  . Acute blood loss anemia 10/05/2012  . Unspecified constipation 10/05/2012  . GERD (gastroesophageal reflux disease) 10/05/2012  . Syncope and collapse 09/18/2012  . Hypotension 09/18/2012  . Confusion 09/18/2012  . CKD (chronic kidney disease), stage III 09/18/2012  . Dehydration 09/18/2012  . Expected blood loss anemia 09/15/2012  . Overweight (BMI 25.0-29.9) 09/15/2012  . S/P bilateral TKA 09/13/2012  . Benign hypertensive heart disease without heart failure 07/15/2012  . PSVT (paroxysmal supraventricular tachycardia) ([place]) 07/15/2012  . Osteoarthritis 06/24/2011  . Dizziness 01/07/2011  . HTN (hypertension) 01/07/2011   Past Medical History:  Diagnosis Date  . Aortic sclerosis    MILD PER ECHO AS WELL AS TRACE MITRAL REGURGITATION - PER CARDIOLOGY OFFICE NOTES 06/2012  . Arthritis   . BPH (benign prostatic hyperplasia)    s/p TURP in July 2012  . Complication of anesthesia    HX OF MINOR ITCHING AFTER ANESTHESIA FOR HIP REPLACEMENT AND FOR COLON SURGERY  . EKG abnormalities    INCOMPLETE RIGHT BUNDLE BRANCH BLOCK AND CHRONIC T-WAVE ABNORMALITIES - PER CARDIOLOGY OFFICE NOTES 06/2012  . GERD (gastroesophageal reflux disease)   . History of paroxysmal supraventricular tachycardia    [name]  . Hypertension   . Palpitations    History of ventricular ectopy. He is status post radiofrequency ablation at the [facility] in the early 90's. for  PVC's  . Vertigo, benign positional    [name]    Family History  Problem Relation Age of Onset  . Heart attack Mother   . Other Neg Hx     Past Surgical History:  Procedure Laterality Date  . ANKLE SURGERY    . CARDIAC CATHETERIZATION  2002   NORMAL CORONARY ARTERIES  . COLON SURGERY  ? 1994  . HERNIA REPAIR  1990  . JOINT REPLACEMENT  ? 2009   LEFT HIP REPLACEMENT  . KIDNEY STONE SURGERY    .  right shoulder rotator cuff repair    . TONSILLECTOMY  Age 69  . TOTAL HIP ARTHROPLASTY Right 08/04/2013   Procedure: RIGHT TOTAL HIP ARTHROPLASTY ANTERIOR APPROACH;  Surgeon: Mauri Pole, MD;  Location: [facility];  Service: Orthopedics;  Laterality: Right;  . TOTAL KNEE ARTHROPLASTY Bilateral 09/13/2012   Procedure: TOTAL KNEE BILATERAL;  Surgeon: Mauri Pole, MD;  Location: [facility];  Service: Orthopedics;  Laterality: Bilateral;  . TRANSURETHRAL RESECTION OF PROSTATE    . US ECHOCARDIOGRAPHY  06-20-2009   Est EF 55-60%   Social History   Occupational History  . Not on file.   Social History Main Topics  . Smoking status: Never Smoker  . Smokeless tobacco: Never Used  . Alcohol use 0.0 oz/week     Comment: Drink Wine almost daily  . Drug use: No  . Sexual activity: Not on file

## 2016-06-26 DIAGNOSIS — N200 Calculus of kidney: Secondary | ICD-10-CM | POA: Diagnosis not present

## 2016-06-26 DIAGNOSIS — N400 Enlarged prostate without lower urinary tract symptoms: Secondary | ICD-10-CM | POA: Diagnosis not present

## 2016-06-30 DIAGNOSIS — H01025 Squamous blepharitis left lower eyelid: Secondary | ICD-10-CM | POA: Diagnosis not present

## 2016-06-30 DIAGNOSIS — H01024 Squamous blepharitis left upper eyelid: Secondary | ICD-10-CM | POA: Diagnosis not present

## 2016-06-30 DIAGNOSIS — H01022 Squamous blepharitis right lower eyelid: Secondary | ICD-10-CM | POA: Diagnosis not present

## 2016-06-30 DIAGNOSIS — H01021 Squamous blepharitis right upper eyelid: Secondary | ICD-10-CM | POA: Diagnosis not present

## 2016-06-30 DIAGNOSIS — H11153 Pinguecula, bilateral: Secondary | ICD-10-CM | POA: Diagnosis not present

## 2016-06-30 DIAGNOSIS — Z961 Presence of intraocular lens: Secondary | ICD-10-CM | POA: Diagnosis not present

## 2016-07-21 ENCOUNTER — Ambulatory Visit: Payer: Medicare Other | Admitting: Cardiology

## 2016-07-21 ENCOUNTER — Encounter (INDEPENDENT_AMBULATORY_CARE_PROVIDER_SITE_OTHER): Payer: Self-pay | Admitting: Orthopedic Surgery

## 2016-07-21 ENCOUNTER — Ambulatory Visit (INDEPENDENT_AMBULATORY_CARE_PROVIDER_SITE_OTHER): Payer: Medicare Other | Admitting: Orthopedic Surgery

## 2016-07-21 DIAGNOSIS — Z9889 Other specified postprocedural states: Secondary | ICD-10-CM

## 2016-07-21 NOTE — Progress Notes (Signed)
Office Visit Note   Patient: [name] [name]           Date of Birth: 08-18-1935           MRN: [id number] Visit Date: 07/21/2016              Requested by: Josetta Huddle, MD [address] [place], [place] [address] PCP: Henrine Screws, MD  Chief Complaint  Patient presents with  . Left Foot - Routine Post Op    Ganglion cyst excision    HPI: Patient is 81 y.o male who presents for follow up ganglion cyst removal left foot on 06/10/16. He is approximately 6 weeks post op. He is doing well overall. He states incision is closed and no longer draining. He does not have to take anything for pain. He is wearing medical compression stockings for his swelling normally. Maxcine Ham, RT  Patient complains of left great toe pain with walking at the MTP joint.  Assessment & Plan: Visit Diagnoses:  1. S/P excision of ganglion cyst     Plan: Incision is healed nicely there is no redness no so is no drainage no signs of infection no signs of recurrence of the cyst. Recommended scar massage. Recommended stiff soled shoes for the hallux rigidus of his great toe.  Follow-Up Instructions: Return if symptoms worsen or fail to improve.   Ortho Exam Examination patient's surgical incision is well-healed there is no signs of infection. Patient does have MTP pain of the left great toe. Examination he does have hallux rigidus with dorsiflexion only about 40. He has palpable bone spurs dorsally. ROS: Complete review of systems negative other than as mentioned in the history of present illness. Imaging: No results found.  Labs: No results found for: HGBA1C, ESRSEDRATE, CRP, LABURIC, REPTSTATUS, GRAMSTAIN, CULT, LABORGA  Orders:  No orders of the defined types were placed in this encounter.  No orders of the defined types were placed in this encounter.    Procedures: No procedures performed  Clinical Data: No additional findings.  Subjective: Review of  Systems  Objective: Vital Signs: There were no vitals taken for this visit.  Specialty Comments:  No specialty comments available.  PMFS History: Patient Active Problem List   Diagnosis Date Noted  . S/P excision of ganglion cyst 07/21/2016  . PVC (premature ventricular contraction): History of ablation many years ago 06/15/2015  . Obese 08/05/2013  . S/P right THA, AA 08/04/2013  . Dyspnea on exertion 12/14/2012  . Unspecified arthropathy, lower leg 10/19/2012  . Secondary renovascular hypertension, benign 10/19/2012  . Acute blood loss anemia 10/05/2012  . Unspecified constipation 10/05/2012  . GERD (gastroesophageal reflux disease) 10/05/2012  . Syncope and collapse 09/18/2012  . Hypotension 09/18/2012  . Confusion 09/18/2012  . CKD (chronic kidney disease), stage III 09/18/2012  . Dehydration 09/18/2012  . Expected blood loss anemia 09/15/2012  . Overweight (BMI 25.0-29.9) 09/15/2012  . S/P bilateral TKA 09/13/2012  . Benign hypertensive heart disease without heart failure 07/15/2012  . PSVT (paroxysmal supraventricular tachycardia) ([place]) 07/15/2012  . Osteoarthritis 06/24/2011  . Dizziness 01/07/2011  . HTN (hypertension) 01/07/2011   Past Medical History:  Diagnosis Date  . Aortic sclerosis    MILD PER ECHO AS WELL AS TRACE MITRAL REGURGITATION - PER CARDIOLOGY OFFICE NOTES 06/2012  . Arthritis   . BPH (benign prostatic hyperplasia)    s/p TURP in July 2012  . Complication of anesthesia    HX OF MINOR ITCHING  AFTER ANESTHESIA FOR HIP REPLACEMENT AND FOR COLON SURGERY  . EKG abnormalities    INCOMPLETE RIGHT BUNDLE BRANCH BLOCK AND CHRONIC T-WAVE ABNORMALITIES - PER CARDIOLOGY OFFICE NOTES 06/2012  . GERD (gastroesophageal reflux disease)   . History of paroxysmal supraventricular tachycardia    [name]  . Hypertension   . Palpitations    History of ventricular ectopy. He is status post radiofrequency ablation at the [facility] in  the early 90's. for PVC's  . Vertigo, benign positional    [name]    Family History  Problem Relation Age of Onset  . Heart attack Mother   . Other Neg Hx     Past Surgical History:  Procedure Laterality Date  . ANKLE SURGERY    . CARDIAC CATHETERIZATION  2002   NORMAL CORONARY ARTERIES  . COLON SURGERY  ? 1994  . HERNIA REPAIR  1990  . JOINT REPLACEMENT  ? 2009   LEFT HIP REPLACEMENT  . KIDNEY STONE SURGERY    . right shoulder rotator cuff repair    . TONSILLECTOMY  Age 55  . TOTAL HIP ARTHROPLASTY Right 08/04/2013   Procedure: RIGHT TOTAL HIP ARTHROPLASTY ANTERIOR APPROACH;  Surgeon: Mauri Pole, MD;  Location: [facility];  Service: Orthopedics;  Laterality: Right;  . TOTAL KNEE ARTHROPLASTY Bilateral 09/13/2012   Procedure: TOTAL KNEE BILATERAL;  Surgeon: Mauri Pole, MD;  Location: [facility];  Service: Orthopedics;  Laterality: Bilateral;  . TRANSURETHRAL RESECTION OF PROSTATE    . US ECHOCARDIOGRAPHY  06-20-2009   Est EF 55-60%   Social History   Occupational History  . Not on file.   Social History Main Topics  . Smoking status: Never Smoker  . Smokeless tobacco: Never Used  . Alcohol use 0.0 oz/week     Comment: Drink Wine almost daily  . Drug use: No  . Sexual activity: Not on file

## 2016-09-10 ENCOUNTER — Other Ambulatory Visit: Payer: Self-pay | Admitting: Cardiology

## 2016-11-17 NOTE — Progress Notes (Signed)
HPI: FU SVT and hypertension. He has a past history of having had ablation for frequent PVCs done at the [facility] clinic in the early 1990s. He had a cardiac catheterization in 2001 showing normal coronary arteries. His most recent nuclear stress test was 12/16/12 showing an ejection fraction of 59% and no ischemia or wall motion abnormalities. He does have a chronically abnormal electrocardiogram with abnormal T waves. Repeat monitor 5/17 showed sinus with pvcs and SVT. Echo 7/17 showed normal LV function; grade 1 DD and trace AI. Seen by Dr Caryl Comes and placed on flecanide for atrial tach with improvement in symptoms; seen by Dr Rayann Heman for possible ablation but pt preferred medical therapy. ETT 10/17 showed no exercise induced VT. Since last seen, he has rare palpitations but much improved. He denies dyspnea, chest pain or syncope.  Current Outpatient Prescriptions  Medication Sig Dispense Refill  . celecoxib (CELEBREX) 200 MG capsule Take 200 mg by mouth 2 (two) times a week.     . Cholecalciferol (VITAMIN D PO) Take 2,000 mg by mouth daily.     . flecainide (TAMBOCOR) 50 MG tablet TAKE ONE TABLET TWICE DAILY 180 tablet 1  . HYDROcodone-acetaminophen (NORCO/VICODIN) 5-325 MG tablet     . lansoprazole (PREVACID) 30 MG capsule Take 30 mg by mouth daily as needed (heart  burn).    . losartan (COZAAR) 50 MG tablet Take 0.5 tablets (25 mg total) by mouth daily. 45 tablet 3  . metoprolol (LOPRESSOR) 50 MG tablet Take by mouth as needed. 25 - 50 MG  IF HEART RATE  GREATER  THAN  90     . metoprolol succinate (TOPROL-XL) 50 MG 24 hr tablet TAKE ONE TABLET TWICE DAILY 60 tablet 1  . polycarbophil (FIBERCON) 625 MG tablet Take 625 mg by mouth daily as needed for mild constipation.    . potassium citrate (UROCIT-K) 10 MEQ (1080 MG) SR tablet Take 10 mEq by mouth 2 (two) times daily.      No current facility-administered medications for this visit.      Past Medical History:  Diagnosis Date  .  Aortic sclerosis    MILD PER ECHO AS WELL AS TRACE MITRAL REGURGITATION - PER CARDIOLOGY OFFICE NOTES 06/2012  . Arthritis   . BPH (benign prostatic hyperplasia)    s/p TURP in July 2012  . Complication of anesthesia    HX OF MINOR ITCHING AFTER ANESTHESIA FOR HIP REPLACEMENT AND FOR COLON SURGERY  . EKG abnormalities    INCOMPLETE RIGHT BUNDLE BRANCH BLOCK AND CHRONIC T-WAVE ABNORMALITIES - PER CARDIOLOGY OFFICE NOTES 06/2012  . GERD (gastroesophageal reflux disease)   . History of paroxysmal supraventricular tachycardia    [name]  . Hypertension   . Palpitations    History of ventricular ectopy. He is status post radiofrequency ablation at the [facility] in the early 90's. for PVC's  . Vertigo, benign positional    [name]    Past Surgical History:  Procedure Laterality Date  . ANKLE SURGERY    . CARDIAC CATHETERIZATION  2002   NORMAL CORONARY ARTERIES  . COLON SURGERY  ? 1994  . HERNIA REPAIR  1990  . JOINT REPLACEMENT  ? 2009   LEFT HIP REPLACEMENT  . KIDNEY STONE SURGERY    . right shoulder rotator cuff repair    . TONSILLECTOMY  Age 81  . TOTAL HIP ARTHROPLASTY Right  08/04/2013   Procedure: RIGHT TOTAL HIP ARTHROPLASTY ANTERIOR APPROACH;  Surgeon: Mauri Pole, MD;  Location: [facility];  Service: Orthopedics;  Laterality: Right;  . TOTAL KNEE ARTHROPLASTY Bilateral 09/13/2012   Procedure: TOTAL KNEE BILATERAL;  Surgeon: Mauri Pole, MD;  Location: [facility];  Service: Orthopedics;  Laterality: Bilateral;  . TRANSURETHRAL RESECTION OF PROSTATE    . US ECHOCARDIOGRAPHY  06-20-2009   Est EF 55-60%    Social History   Social History  . Marital status: Married    Spouse name: N/A  . Number of children: N/A  . Years of education: N/A   Occupational History  . Not on file.   Social History Main Topics  . Smoking status: Never Smoker  . Smokeless tobacco: Never Used  . Alcohol use  0.0 oz/week     Comment: Drink Wine almost daily  . Drug use: No  . Sexual activity: Not on file   Other Topics Concern  . Not on file   Social History Narrative  . No narrative on file    Family History  Problem Relation Age of Onset  . Heart attack Mother   . Other Neg Hx     ROS: no fevers or chills, productive cough, hemoptysis, dysphasia, odynophagia, melena, hematochezia, dysuria, hematuria, rash, seizure activity, orthopnea, PND, pedal edema, claudication. Remaining systems are negative.  Physical Exam: Well-developed well-nourished in no acute distress.  Skin is warm and dry.  HEENT is normal.  Neck is supple.  Chest is clear to auscultation with normal expansion.  Cardiovascular exam is regular rate and rhythm.  Abdominal exam nontender or distended. No masses palpated. Extremities show no edema. neuro grossly intact  ECG- Sinus bradycardia at a rate of 49. Anterior and inferior T-wave inversion. RV conduction delay. personally reviewed; not changed compared to 1/18.  A/P  1 Atrial tachycardia-patient appears to be doing well from a symptomatic standpoint. We will continue with present dose of flecainide and metoprolol. We can consider referral for ablation if his symptoms worsen in the future.  2 hypertension-blood pressure controlled. Continue present medications.  3 dizziness-symptoms are chronic and I think unlikely to be cardiac related.  Kirk Ruths, MD

## 2016-11-25 ENCOUNTER — Other Ambulatory Visit: Payer: Self-pay | Admitting: Internal Medicine

## 2016-11-25 NOTE — Telephone Encounter (Signed)
This is Dr. Crenshaw's pt 

## 2016-12-01 ENCOUNTER — Ambulatory Visit (INDEPENDENT_AMBULATORY_CARE_PROVIDER_SITE_OTHER): Payer: Medicare Other | Admitting: Cardiology

## 2016-12-01 ENCOUNTER — Encounter: Payer: Self-pay | Admitting: Cardiology

## 2016-12-01 VITALS — BP 138/76 | HR 49 | Ht 72.0 in | Wt 227.0 lb

## 2016-12-01 DIAGNOSIS — R002 Palpitations: Secondary | ICD-10-CM

## 2016-12-01 DIAGNOSIS — I471 Supraventricular tachycardia: Secondary | ICD-10-CM | POA: Diagnosis not present

## 2016-12-01 DIAGNOSIS — I10 Essential (primary) hypertension: Secondary | ICD-10-CM | POA: Diagnosis not present

## 2016-12-01 NOTE — Patient Instructions (Signed)
Your physician wants you to follow-up in: 6 MONTHS WITH DR CRENSHAW You will receive a reminder letter in the mail two months in advance. If you don't receive a letter, please call our office to schedule the follow-up appointment.   If you need a refill on your cardiac medications before your next appointment, please call your pharmacy.  

## 2016-12-02 ENCOUNTER — Other Ambulatory Visit: Payer: Self-pay | Admitting: Cardiology

## 2016-12-15 ENCOUNTER — Other Ambulatory Visit: Payer: Self-pay | Admitting: Cardiology

## 2016-12-17 DIAGNOSIS — I10 Essential (primary) hypertension: Secondary | ICD-10-CM | POA: Diagnosis not present

## 2016-12-17 DIAGNOSIS — M179 Osteoarthritis of knee, unspecified: Secondary | ICD-10-CM | POA: Diagnosis not present

## 2016-12-17 DIAGNOSIS — Z79899 Other long term (current) drug therapy: Secondary | ICD-10-CM | POA: Diagnosis not present

## 2016-12-17 DIAGNOSIS — E559 Vitamin D deficiency, unspecified: Secondary | ICD-10-CM | POA: Diagnosis not present

## 2016-12-17 DIAGNOSIS — N200 Calculus of kidney: Secondary | ICD-10-CM | POA: Diagnosis not present

## 2016-12-17 DIAGNOSIS — Z0000 Encounter for general adult medical examination without abnormal findings: Secondary | ICD-10-CM | POA: Diagnosis not present

## 2016-12-17 DIAGNOSIS — H9313 Tinnitus, bilateral: Secondary | ICD-10-CM | POA: Diagnosis not present

## 2016-12-17 DIAGNOSIS — Z86010 Personal history of colonic polyps: Secondary | ICD-10-CM | POA: Diagnosis not present

## 2016-12-17 DIAGNOSIS — Z1389 Encounter for screening for other disorder: Secondary | ICD-10-CM | POA: Diagnosis not present

## 2016-12-17 DIAGNOSIS — K219 Gastro-esophageal reflux disease without esophagitis: Secondary | ICD-10-CM | POA: Diagnosis not present

## 2016-12-17 DIAGNOSIS — I471 Supraventricular tachycardia: Secondary | ICD-10-CM | POA: Diagnosis not present

## 2016-12-17 DIAGNOSIS — J309 Allergic rhinitis, unspecified: Secondary | ICD-10-CM | POA: Diagnosis not present

## 2016-12-17 DIAGNOSIS — R1032 Left lower quadrant pain: Secondary | ICD-10-CM | POA: Diagnosis not present

## 2016-12-17 DIAGNOSIS — I6529 Occlusion and stenosis of unspecified carotid artery: Secondary | ICD-10-CM | POA: Diagnosis not present

## 2016-12-26 DIAGNOSIS — L570 Actinic keratosis: Secondary | ICD-10-CM | POA: Diagnosis not present

## 2016-12-26 DIAGNOSIS — L821 Other seborrheic keratosis: Secondary | ICD-10-CM | POA: Diagnosis not present

## 2016-12-26 DIAGNOSIS — C44712 Basal cell carcinoma of skin of right lower limb, including hip: Secondary | ICD-10-CM | POA: Diagnosis not present

## 2016-12-26 DIAGNOSIS — D1801 Hemangioma of skin and subcutaneous tissue: Secondary | ICD-10-CM | POA: Diagnosis not present

## 2016-12-26 DIAGNOSIS — D225 Melanocytic nevi of trunk: Secondary | ICD-10-CM | POA: Diagnosis not present

## 2016-12-26 DIAGNOSIS — Z85828 Personal history of other malignant neoplasm of skin: Secondary | ICD-10-CM | POA: Diagnosis not present

## 2016-12-26 DIAGNOSIS — L814 Other melanin hyperpigmentation: Secondary | ICD-10-CM | POA: Diagnosis not present

## 2016-12-26 DIAGNOSIS — L918 Other hypertrophic disorders of the skin: Secondary | ICD-10-CM | POA: Diagnosis not present

## 2016-12-29 DIAGNOSIS — Z1212 Encounter for screening for malignant neoplasm of rectum: Secondary | ICD-10-CM | POA: Diagnosis not present

## 2016-12-29 DIAGNOSIS — Z1211 Encounter for screening for malignant neoplasm of colon: Secondary | ICD-10-CM | POA: Diagnosis not present

## 2017-01-12 ENCOUNTER — Encounter (INDEPENDENT_AMBULATORY_CARE_PROVIDER_SITE_OTHER): Payer: Self-pay | Admitting: Orthopedic Surgery

## 2017-01-12 ENCOUNTER — Ambulatory Visit (INDEPENDENT_AMBULATORY_CARE_PROVIDER_SITE_OTHER): Payer: Medicare Other | Admitting: Orthopedic Surgery

## 2017-01-12 DIAGNOSIS — Z9889 Other specified postprocedural states: Secondary | ICD-10-CM

## 2017-01-12 DIAGNOSIS — M25572 Pain in left ankle and joints of left foot: Secondary | ICD-10-CM | POA: Diagnosis not present

## 2017-01-12 NOTE — Progress Notes (Signed)
Office Visit Note   Patient: [name] [name]           Date of Birth: [birth year]           MRN: [id number] Visit Date: 01/12/2017              Requested by: Josetta Huddle, MD [address] [place], [place] [address] PCP: Josetta Huddle, MD  Chief Complaint  Patient presents with  . Left Foot - Follow-up    HPI: Patient is 81 y.o male who presents for evaluation of swelling left anterior ankle. Has history of ganglion cyst removal left foot on 06/10/16.   Noticed swelling and pain, to anterolateral ankle for several days. This is gone today. No pain or swelling today.    Assessment & Plan: Visit Diagnoses:  1. Pain in left ankle and joints of left foot   2. S/P excision of ganglion cyst     Plan: follow up in office as needed.   Follow-Up Instructions: Return if symptoms worsen or fail to improve.   Ortho Exam Examination patient's surgical incision is well-healed there is no signs of infection. Does have some scarring to anterior ankle which is palpable. No erythema. No cyst. No open area.  ROS: Complete review of systems negative other than as mentioned in the history of present illness. Imaging: No results found.  Labs: No results found for: HGBA1C, ESRSEDRATE, CRP, LABURIC, REPTSTATUS, GRAMSTAIN, CULT, LABORGA  Orders:  No orders of the defined types were placed in this encounter.  No orders of the defined types were placed in this encounter.    Procedures: No procedures performed  Clinical Data: No additional findings.  Subjective: Review of Systems  Constitutional: Negative for chills and fever.  Musculoskeletal: Positive for arthralgias. Negative for joint swelling.  Skin: Negative for color change and wound.  Neurological: Negative for weakness.    Objective: Vital Signs: There were no vitals taken for this visit.  Specialty Comments:  No specialty comments available.  PMFS History: Patient Active Problem List   Diagnosis Date  Noted  . S/P excision of ganglion cyst 07/21/2016  . PVC (premature ventricular contraction): History of ablation many years ago 06/15/2015  . Obese 08/05/2013  . S/P right THA, AA 08/04/2013  . Dyspnea on exertion 12/14/2012  . Unspecified arthropathy, lower leg 10/19/2012  . Secondary renovascular hypertension, benign 10/19/2012  . Acute blood loss anemia 10/05/2012  . Unspecified constipation 10/05/2012  . GERD (gastroesophageal reflux disease) 10/05/2012  . Syncope and collapse 09/18/2012  . Hypotension 09/18/2012  . Confusion 09/18/2012  . CKD (chronic kidney disease), stage III 09/18/2012  . Dehydration 09/18/2012  . Expected blood loss anemia 09/15/2012  . Overweight (BMI 25.0-29.9) 09/15/2012  . S/P bilateral TKA 09/13/2012  . Benign hypertensive heart disease without heart failure 07/15/2012  . PSVT (paroxysmal supraventricular tachycardia) ([place]) 07/15/2012  . Osteoarthritis 06/24/2011  . Dizziness 01/07/2011  . HTN (hypertension) 01/07/2011   Past Medical History:  Diagnosis Date  . Aortic sclerosis    MILD PER ECHO AS WELL AS TRACE MITRAL REGURGITATION - PER CARDIOLOGY OFFICE NOTES 06/2012  . Arthritis   . BPH (benign prostatic hyperplasia)    s/p TURP in July 2012  . Complication of anesthesia    HX OF MINOR ITCHING AFTER ANESTHESIA FOR HIP REPLACEMENT AND FOR COLON SURGERY  . EKG abnormalities    INCOMPLETE RIGHT BUNDLE BRANCH BLOCK AND CHRONIC T-WAVE ABNORMALITIES - PER CARDIOLOGY OFFICE NOTES 06/2012  . GERD (  gastroesophageal reflux disease)   . History of paroxysmal supraventricular tachycardia    [name]  . Hypertension   . Palpitations    History of ventricular ectopy. He is status post radiofrequency ablation at the [facility] in the early 90's. for PVC's  . Vertigo, benign positional    [name]    Family History  Problem Relation Age of Onset  . Heart attack  Mother   . Other Neg Hx     Past Surgical History:  Procedure Laterality Date  . ANKLE SURGERY    . CARDIAC CATHETERIZATION  2002   NORMAL CORONARY ARTERIES  . COLON SURGERY  ? 1994  . HERNIA REPAIR  1990  . JOINT REPLACEMENT  ? 2009   LEFT HIP REPLACEMENT  . KIDNEY STONE SURGERY    . right shoulder rotator cuff repair    . TONSILLECTOMY  Age 5  . TOTAL HIP ARTHROPLASTY Right 08/04/2013   Procedure: RIGHT TOTAL HIP ARTHROPLASTY ANTERIOR APPROACH;  Surgeon: Mauri Pole, MD;  [facility]: [facility];  Service: Orthopedics;  Laterality: Right;  . TOTAL KNEE ARTHROPLASTY Bilateral 09/13/2012   Procedure: TOTAL KNEE BILATERAL;  Surgeon: Mauri Pole, MD;  Location: [facility];  Service: Orthopedics;  Laterality: Bilateral;  . TRANSURETHRAL RESECTION OF PROSTATE    . US ECHOCARDIOGRAPHY  06-20-2009   Est EF 55-60%   Social History   Occupational History  . Not on file.   Social History Main Topics  . Smoking status: Never Smoker  . Smokeless tobacco: Never Used  . Alcohol use 0.0 oz/week     Comment: Drink Wine almost daily  . Drug use: No  . Sexual activity: Not on file

## 2017-02-25 DIAGNOSIS — Z23 Encounter for immunization: Secondary | ICD-10-CM | POA: Diagnosis not present

## 2017-02-26 ENCOUNTER — Ambulatory Visit (INDEPENDENT_AMBULATORY_CARE_PROVIDER_SITE_OTHER): Payer: Medicare Other

## 2017-02-26 ENCOUNTER — Ambulatory Visit (INDEPENDENT_AMBULATORY_CARE_PROVIDER_SITE_OTHER): Payer: Medicare Other | Admitting: Orthopaedic Surgery

## 2017-02-26 DIAGNOSIS — M67472 Ganglion, left ankle and foot: Secondary | ICD-10-CM | POA: Diagnosis not present

## 2017-02-26 DIAGNOSIS — M25572 Pain in left ankle and joints of left foot: Secondary | ICD-10-CM

## 2017-02-26 NOTE — Progress Notes (Signed)
Office Visit Note   Patient: [name] [name]           Date of Birth: [birth year]           MRN: [id number] Visit Date: 02/26/2017              Requested by: Josetta Huddle, MD [address] [place], [place] [address] PCP: Josetta Huddle, MD   Assessment & Plan: Visit Diagnoses:  1. Pain in left ankle and joints of left foot   2. Ganglion cyst of left foot     Plan: I was able to clean the area of his ankle with better alcohol and then with an 18-gauge needle was able to aspirate just a small amount of gelatinous material in this area suggesting recurrence of the cyst. I do feel that the areas inflamed as well. I then placed an injection of 1 mL of lidocaine and 1 mL of Depo-Medrol and this gave him immediate relief of lidocaine. All questions concern for answered and addressed. A follow-up as needed but at this continues to be problematic we will have him come back and see [place].  Follow-Up Instructions: Return if symptoms worsen or fail to improve.   Orders:  Orders Placed This Encounter  Procedures  . XR Ankle Complete Left   No orders of the defined types were placed in this encounter.     Procedures: No procedures performed   Clinical Data: No additional findings.   Subjective: No chief complaint on file. Patient is a very pleasant 81 year old gentleman who had a cyst removed from the anterior ankle area of his left foot and ankle in January of this year by one of my partners. He is recently developed swelling in this area and increasing pain and needed to be worked in today due to this pain. He is concerned about how the area hurts. He denies any redness. He denies any drainage.  HPI  Review of Systems He currently denies any fever, chills, nausea, vomiting, headache, shortness of breath, chest pain  Objective: Vital Signs: There were no vitals taken for this visit.  Physical Exam He is alert and oriented 3 and in no acute distress Ortho  Exam Examination of his left ankle shows well-healed surgical incision anterior. There is no evidence of cellulitis and no drainage. There is a palpable mass in this area that is painful to the touch. Specialty Comments:  No specialty comments available.  Imaging: Xr Ankle Complete Left  Result Date: 02/26/2017 3 views of his left ankle show no acute findings. There is no evidence of fracture or osteomyelitis.    PMFS History: Patient Active Problem List   Diagnosis Date Noted  . S/P excision of ganglion cyst 07/21/2016  . PVC (premature ventricular contraction): History of ablation many years ago 06/15/2015  . Obese 08/05/2013  . S/P right THA, AA 08/04/2013  . Dyspnea on exertion 12/14/2012  . Unspecified arthropathy, lower leg 10/19/2012  . Secondary renovascular hypertension, benign 10/19/2012  . Acute blood loss anemia 10/05/2012  . Unspecified constipation 10/05/2012  . GERD (gastroesophageal reflux disease) 10/05/2012  . Syncope and collapse 09/18/2012  . Hypotension 09/18/2012  . Confusion 09/18/2012  . CKD (chronic kidney disease), stage III ([place]) 09/18/2012  . Dehydration 09/18/2012  . Expected blood loss anemia 09/15/2012  . Overweight (BMI 25.0-29.9) 09/15/2012  . S/P bilateral TKA 09/13/2012  . Benign hypertensive heart disease without heart failure 07/15/2012  . PSVT (paroxysmal supraventricular tachycardia) ([place]) 07/15/2012  .  Osteoarthritis 06/24/2011  . Dizziness 01/07/2011  . HTN (hypertension) 01/07/2011   Past Medical History:  Diagnosis Date  . Aortic sclerosis    MILD PER ECHO AS WELL AS TRACE MITRAL REGURGITATION - PER CARDIOLOGY OFFICE NOTES 06/2012  . Arthritis   . BPH (benign prostatic hyperplasia)    s/p TURP in July 2012  . Complication of anesthesia    HX OF MINOR ITCHING AFTER ANESTHESIA FOR HIP REPLACEMENT AND FOR COLON SURGERY  . EKG abnormalities    INCOMPLETE RIGHT BUNDLE BRANCH BLOCK AND CHRONIC T-WAVE ABNORMALITIES - PER CARDIOLOGY  OFFICE NOTES 06/2012  . GERD (gastroesophageal reflux disease)   . History of paroxysmal supraventricular tachycardia    [name]  . Hypertension   . Palpitations    History of ventricular ectopy. He is status post radiofrequency ablation at the [facility] in the early 90's. for PVC's  . Vertigo, benign positional    [name]    Family History  Problem Relation Age of Onset  . Heart attack Mother   . Other Neg Hx     Past Surgical History:  Procedure Laterality Date  . ANKLE SURGERY    . CARDIAC CATHETERIZATION  2002   NORMAL CORONARY ARTERIES  . COLON SURGERY  ? 1994  . HERNIA REPAIR  1990  . JOINT REPLACEMENT  ? 2009   LEFT HIP REPLACEMENT  . KIDNEY STONE SURGERY    . right shoulder rotator cuff repair    . TONSILLECTOMY  Age 64  . TOTAL HIP ARTHROPLASTY Right 08/04/2013   Procedure: RIGHT TOTAL HIP ARTHROPLASTY ANTERIOR APPROACH;  Surgeon: Mauri Pole, MD;  Location: [facility];  Service: Orthopedics;  Laterality: Right;  . TOTAL KNEE ARTHROPLASTY Bilateral 09/13/2012   Procedure: TOTAL KNEE BILATERAL;  Surgeon: Mauri Pole, MD;  Location: [facility];  Service: Orthopedics;  Laterality: Bilateral;  . TRANSURETHRAL RESECTION OF PROSTATE    . US ECHOCARDIOGRAPHY  06-20-2009   Est EF 55-60%   Social History   Occupational History  . Not on file.   Social History Main Topics  . Smoking status: Never Smoker  . Smokeless tobacco: Never Used  . Alcohol use 0.0 oz/week     Comment: Drink Wine almost daily  . Drug use: No  . Sexual activity: Not on file

## 2017-03-24 DIAGNOSIS — J040 Acute laryngitis: Secondary | ICD-10-CM | POA: Diagnosis not present

## 2017-03-24 DIAGNOSIS — J069 Acute upper respiratory infection, unspecified: Secondary | ICD-10-CM | POA: Diagnosis not present

## 2017-03-29 ENCOUNTER — Ambulatory Visit (HOSPITAL_COMMUNITY)
Admission: EM | Admit: 2017-03-29 | Discharge: 2017-03-29 | Discharge status: Absent without leave | Payer: Medicare Other

## 2017-03-29 DIAGNOSIS — J069 Acute upper respiratory infection, unspecified: Secondary | ICD-10-CM | POA: Diagnosis not present

## 2017-04-14 ENCOUNTER — Other Ambulatory Visit: Payer: Self-pay | Admitting: Internal Medicine

## 2017-04-14 ENCOUNTER — Ambulatory Visit
Admission: RE | Admit: 2017-04-14 | Discharge: 2017-04-14 | Disposition: A | Discharge status: Home / Self Care | Payer: Medicare Other | Source: Ambulatory Visit | Attending: Internal Medicine | Admitting: Internal Medicine

## 2017-04-14 DIAGNOSIS — S20211A Contusion of right front wall of thorax, initial encounter: Secondary | ICD-10-CM

## 2017-04-14 DIAGNOSIS — R0781 Pleurodynia: Secondary | ICD-10-CM | POA: Diagnosis not present

## 2017-04-14 DIAGNOSIS — J9811 Atelectasis: Secondary | ICD-10-CM | POA: Diagnosis not present

## 2017-04-14 DIAGNOSIS — S20219A Contusion of unspecified front wall of thorax, initial encounter: Secondary | ICD-10-CM

## 2017-04-14 DIAGNOSIS — S299XXA Unspecified injury of thorax, initial encounter: Secondary | ICD-10-CM | POA: Diagnosis not present

## 2017-04-14 IMAGING — DX DG CHEST 2V
2 series · 2 of 2 positions shown · non-contrast
Comparison: [DATE]

CLINICAL DATA: Contusion right chest wall.  Fall.

EXAM:
CHEST  2 VIEW

[dg chest 2 view (1 of 2)]
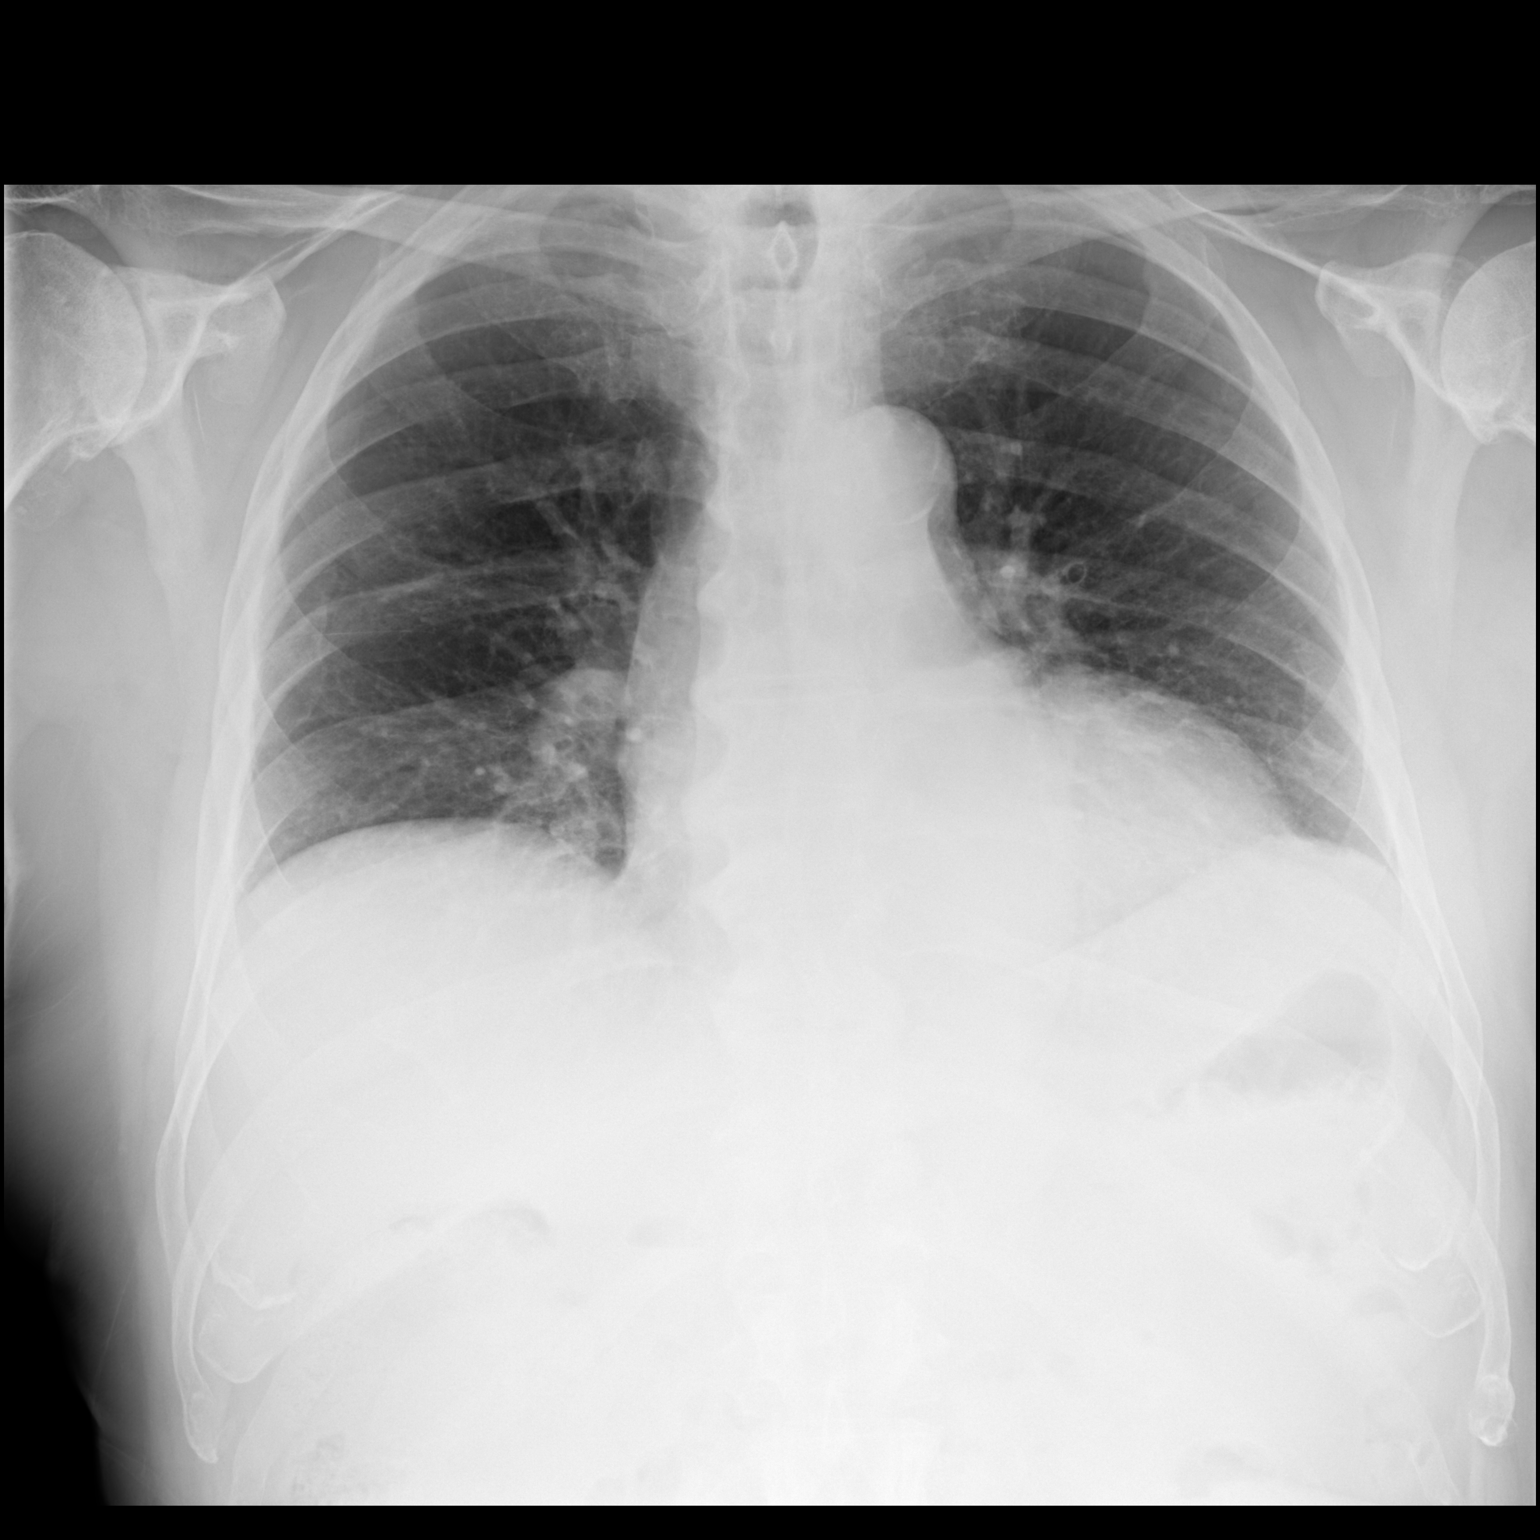

[dg chest 2 view (2 of 2)]
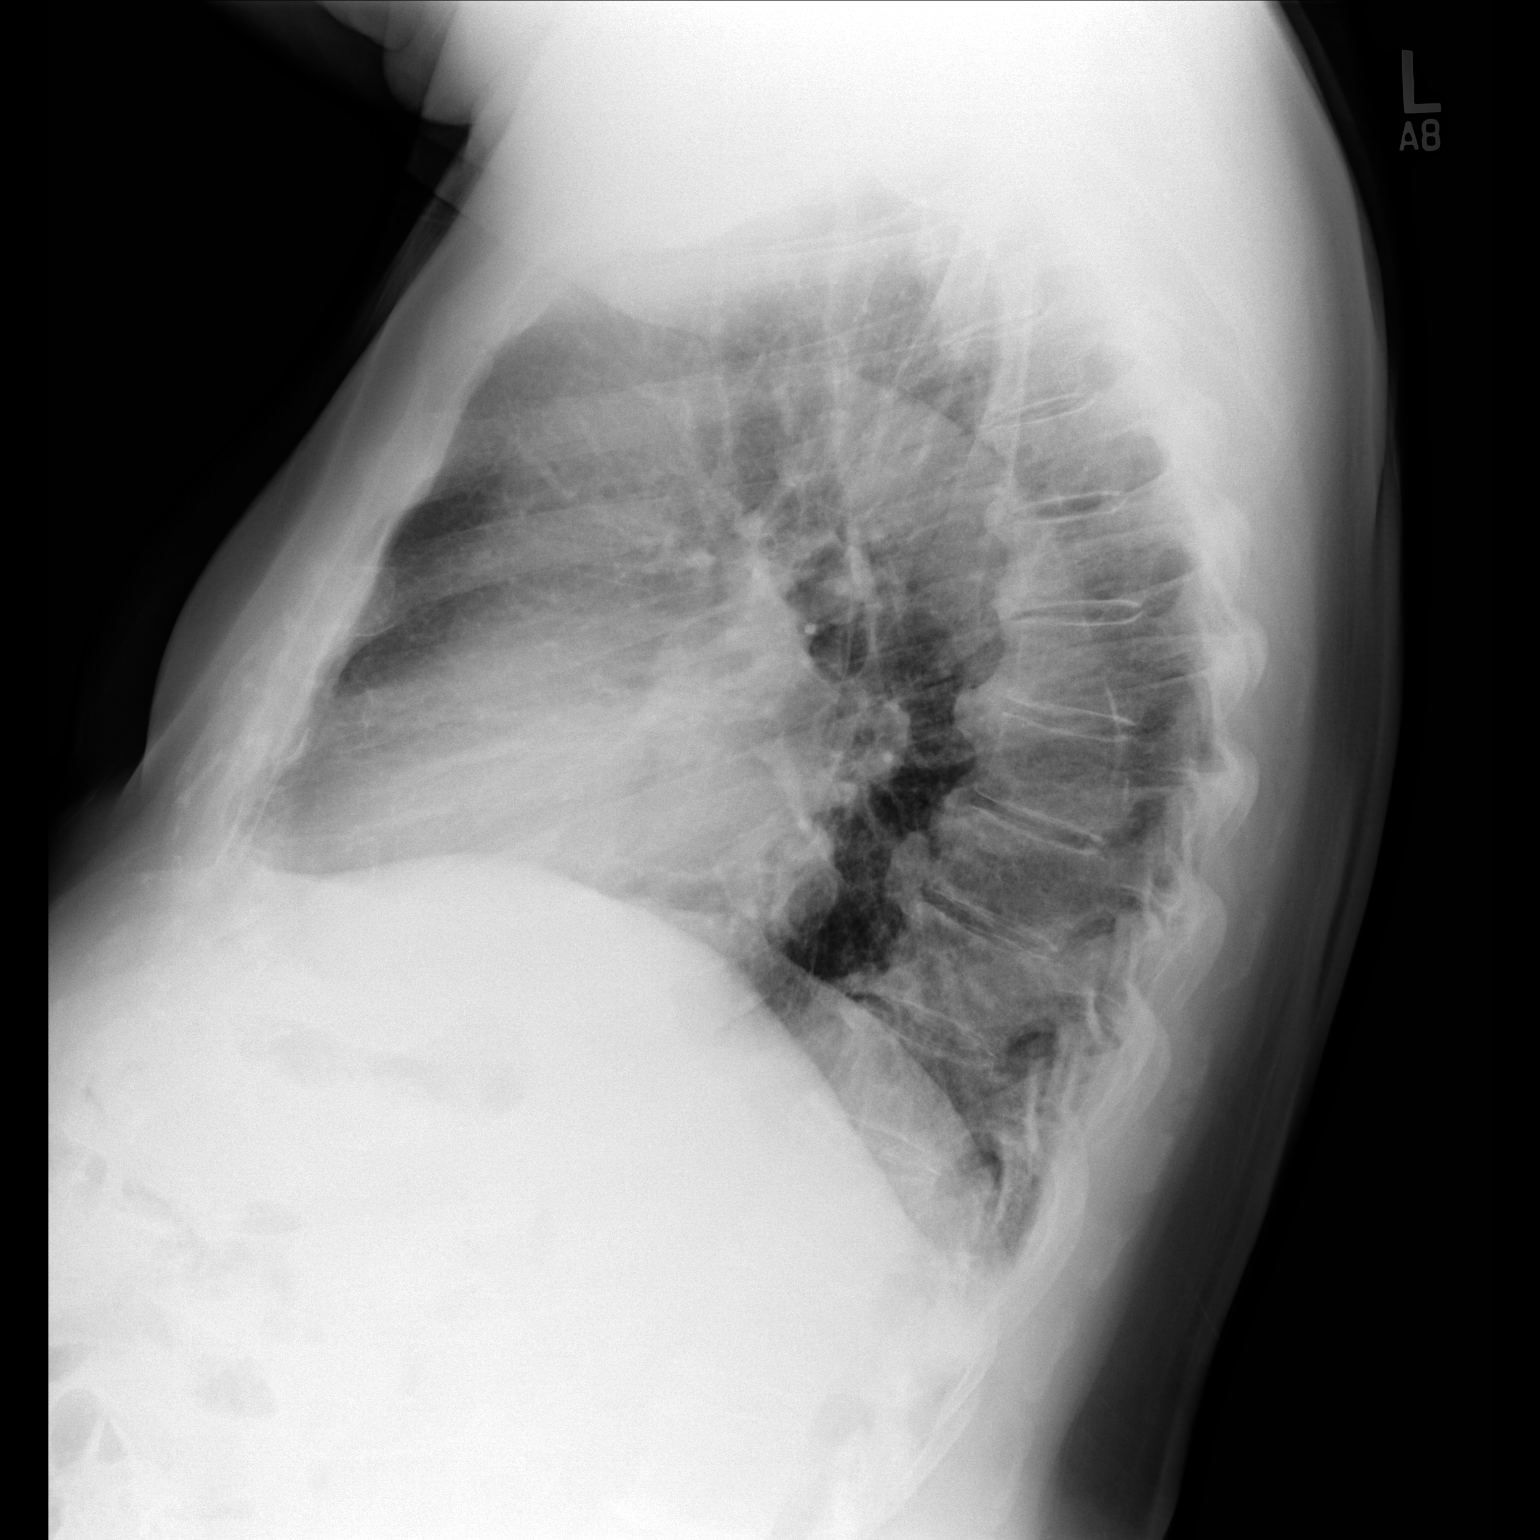

[2 of 2 positions shown; findings below may reference images not displayed]

FINDINGS: Mild cardiomegaly and vascular congestion. Low lung volumes with
bibasilar atelectasis. No visible rib fracture or pneumothorax. No
effusions. No acute bony abnormality. Degenerative changes in the
thoracic spine and shoulders.
IMPRESSION: Cardiomegaly with vascular congestion.

Low volumes with bibasilar atelectasis.

## 2017-04-14 IMAGING — DX DG RIBS 2V*R*
2 series · 2 of 2 positions shown · non-contrast
Comparison: Chest x-ray today and [DATE]

CLINICAL DATA: Fall, right anterior lower rib pain.

EXAM:
RIGHT RIBS - 2 VIEW

[dg ribs unilateral right (1 of 2)]
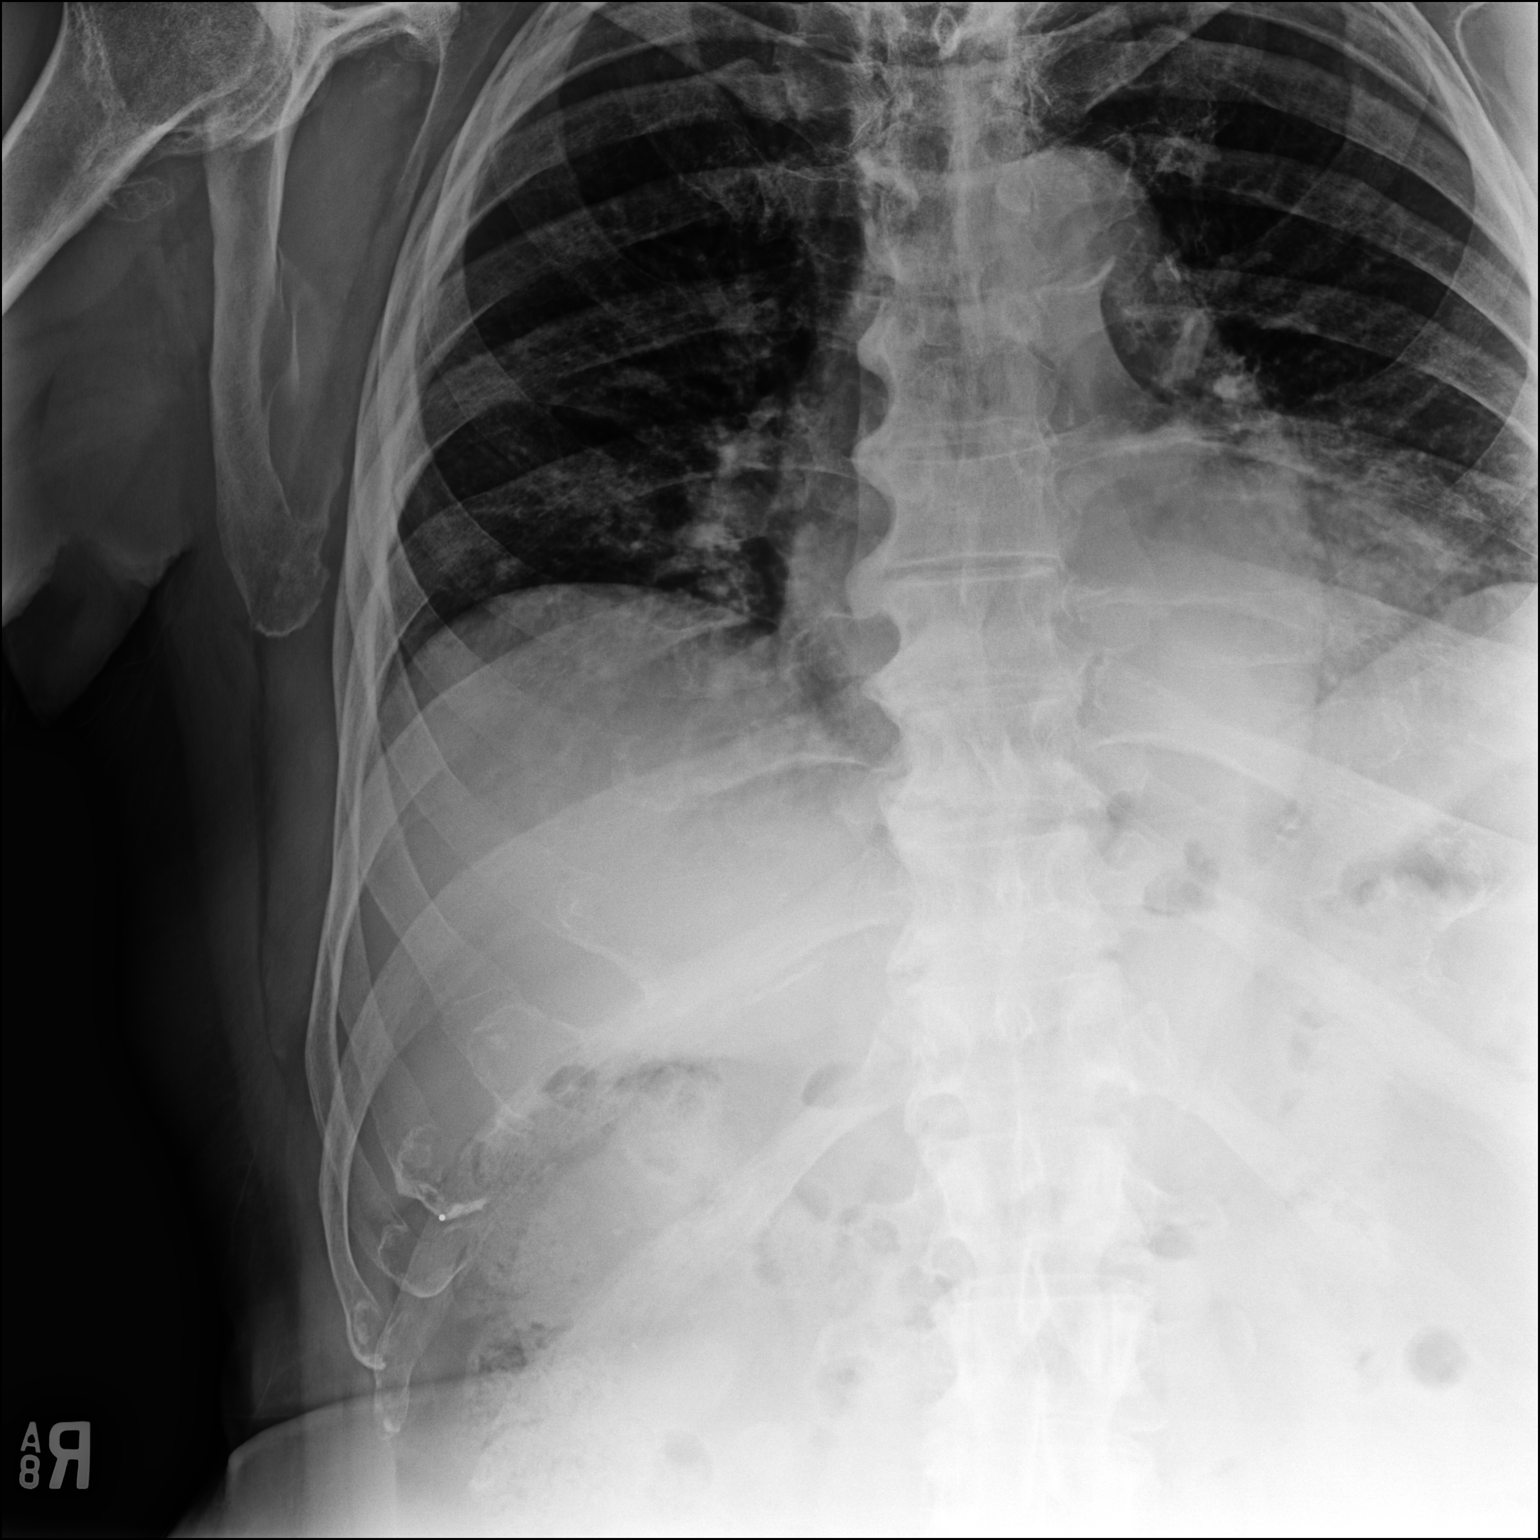

[dg ribs unilateral right (2 of 2)]
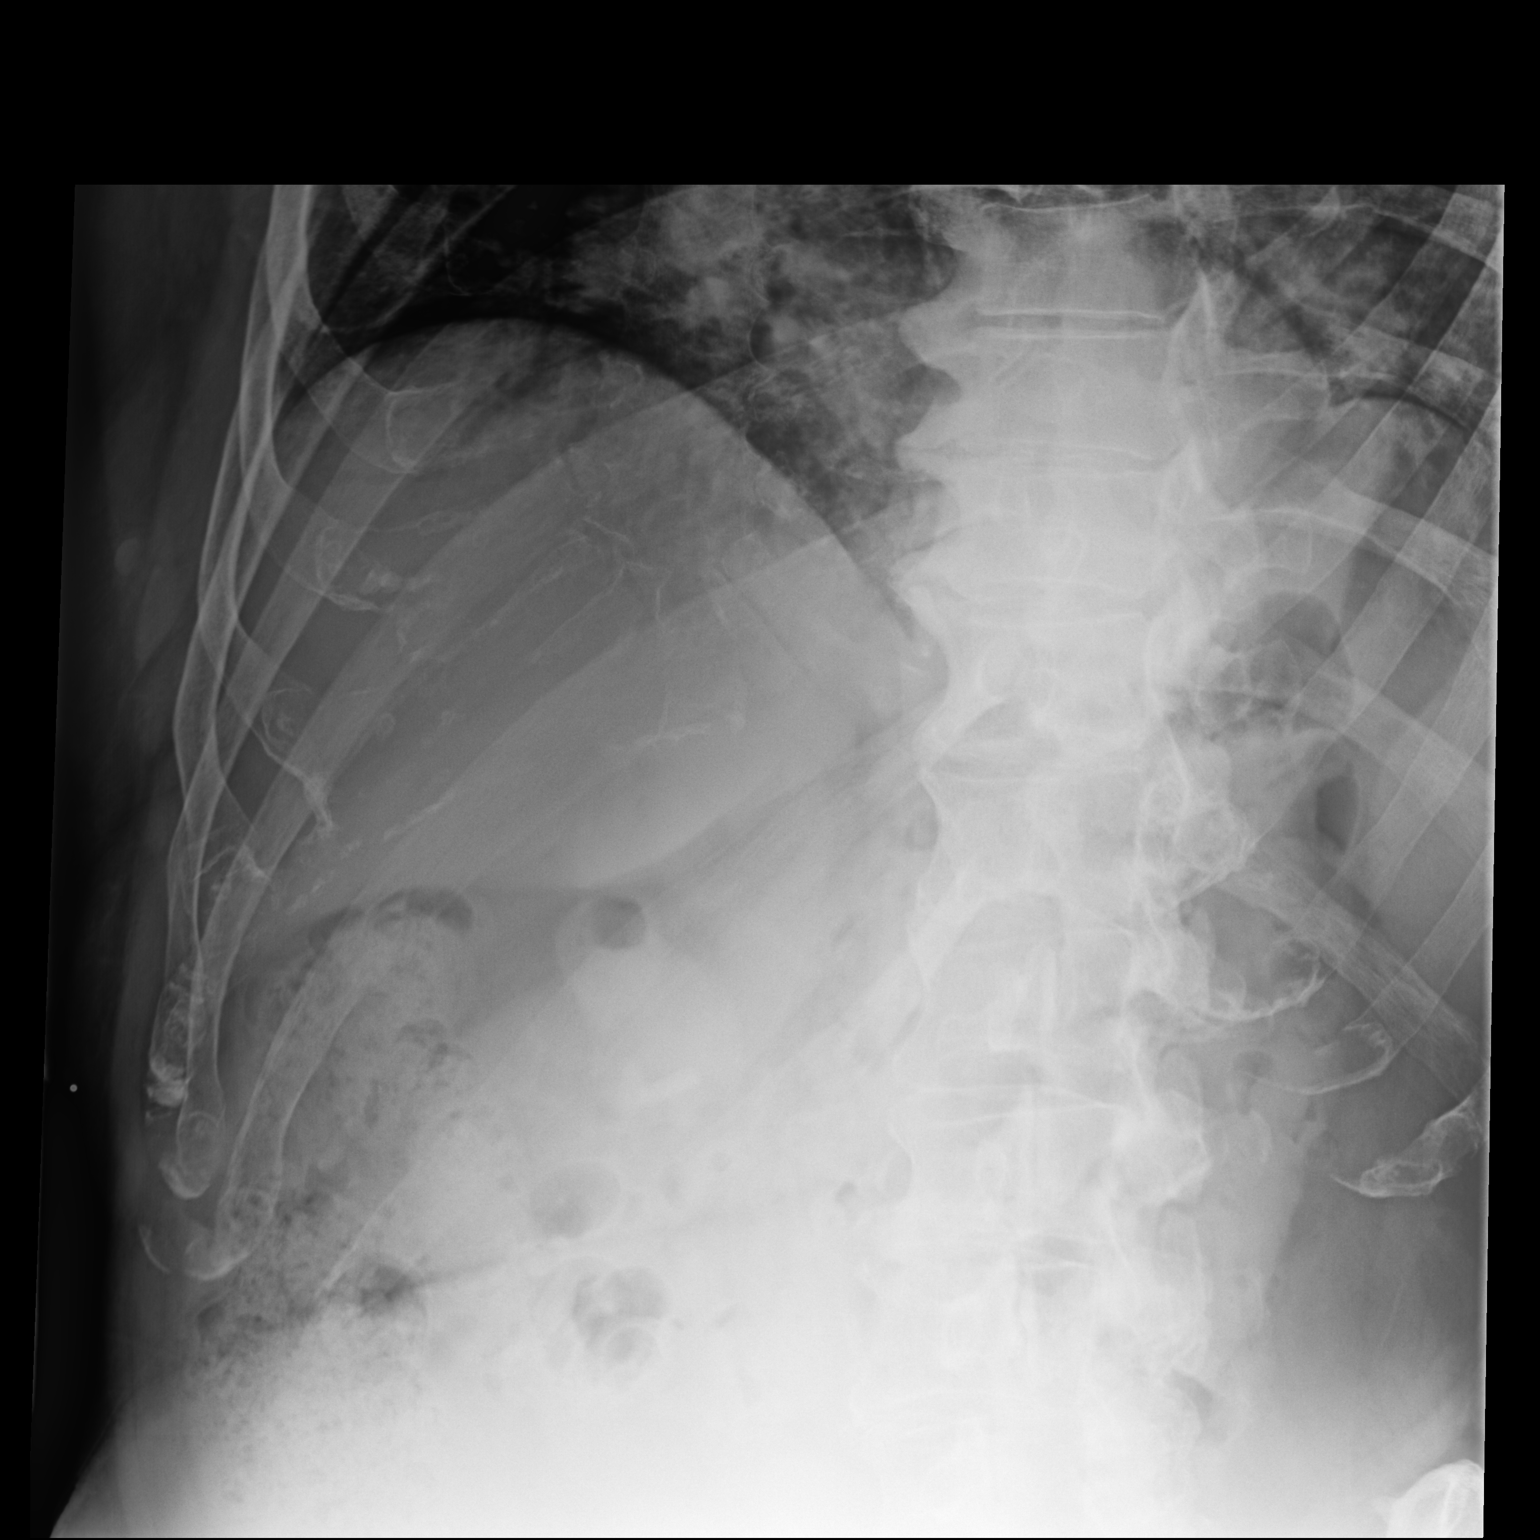

[2 of 2 positions shown; findings below may reference images not displayed]

FINDINGS: No visible rib fracture or focal rib lesion.
IMPRESSION: Negative.

## 2017-04-20 ENCOUNTER — Ambulatory Visit (INDEPENDENT_AMBULATORY_CARE_PROVIDER_SITE_OTHER): Payer: Medicare Other | Admitting: Orthopedic Surgery

## 2017-04-20 ENCOUNTER — Encounter (INDEPENDENT_AMBULATORY_CARE_PROVIDER_SITE_OTHER): Payer: Self-pay | Admitting: Orthopedic Surgery

## 2017-04-20 VITALS — Ht 72.0 in | Wt 227.0 lb

## 2017-04-20 DIAGNOSIS — M67472 Ganglion, left ankle and foot: Secondary | ICD-10-CM | POA: Diagnosis not present

## 2017-04-20 NOTE — Progress Notes (Signed)
Office Visit Note   Patient: [name] [name]           Date of Birth: [birth year]           MRN: [id number] Visit Date: 04/20/2017              Requested by: Josetta Huddle, MD [address] [place], [place] [address] PCP: Josetta Huddle, MD  Chief Complaint  Patient presents with  . Left Foot - Open Wound    Hx ganglion cyst excision 05/2016 reports reoccurrence that is now "draining pus"        HPI: Patient presents in follow-up for the left foot.  Patient is almost a year out from excision of a large ganglion cyst dorsal lateral aspect of the left foot.  Patient states that the cyst has come back and he does have some drainage of cystic fluid.  Patient states that the fluid is clear he has been using Neosporin.  Assessment & Plan: Visit Diagnoses:  1. Ganglion cyst of left foot     Plan: Discussed with the patient options regarding revision excision of the new cyst versus observation.  Patient states he is heading to [place] in 2 weeks and would like to wait until after he returns from [place].  Recommended he continue the antibiotic ointment and a Band-Aid.  Follow-Up Instructions: Return if symptoms worsen or fail to improve.   Ortho Exam  Patient is alert, oriented, no adenopathy, well-dressed, normal affect, normal respiratory effort. Examination patient has no redness or cellulitis over the left foot.  He has good pulses he has a wound which is approximately 2 mm in diameter and clear synovial cystic fluid is expressed.  There is no purulence no abscess no cellulitis no signs of infection there is no tenderness to palpation.  The synovial fluid was expressed this was touched with silver nitrate Band-Aid with Bactroban ointment was applied.  Imaging: No results found. No images are attached to the encounter.  Labs: No results found for: HGBA1C, ESRSEDRATE, CRP, LABURIC, REPTSTATUS, GRAMSTAIN, CULT, LABORGA  @LABSALLVALUES (HGBA1)@  Body mass index is 30.79  kg/m.  Orders:  No orders of the defined types were placed in this encounter.  No orders of the defined types were placed in this encounter.    Procedures: No procedures performed  Clinical Data: No additional findings.  ROS:  All other systems negative, except as noted in the HPI. Review of Systems  Objective: Vital Signs: Ht 6' (1.829 m)   Wt 227 lb (103 kg)   BMI 30.79 kg/m   Specialty Comments:  No specialty comments available.  PMFS History: Patient Active Problem List   Diagnosis Date Noted  . S/P excision of ganglion cyst 07/21/2016  . PVC (premature ventricular contraction): History of ablation many years ago 06/15/2015  . Obese 08/05/2013  . S/P right THA, AA 08/04/2013  . Dyspnea on exertion 12/14/2012  . Unspecified arthropathy, lower leg 10/19/2012  . Secondary renovascular hypertension, benign 10/19/2012  . Acute blood loss anemia 10/05/2012  . Unspecified constipation 10/05/2012  . GERD (gastroesophageal reflux disease) 10/05/2012  . Syncope and collapse 09/18/2012  . Hypotension 09/18/2012  . Confusion 09/18/2012  . CKD (chronic kidney disease), stage III ([place]) 09/18/2012  . Dehydration 09/18/2012  . Expected blood loss anemia 09/15/2012  . Overweight (BMI 25.0-29.9) 09/15/2012  . S/P bilateral TKA 09/13/2012  . Benign hypertensive heart disease without heart failure 07/15/2012  . PSVT (paroxysmal supraventricular tachycardia) ([place]) 07/15/2012  .  Osteoarthritis 06/24/2011  . Dizziness 01/07/2011  . HTN (hypertension) 01/07/2011   Past Medical History:  Diagnosis Date  . Aortic sclerosis    MILD PER ECHO AS WELL AS TRACE MITRAL REGURGITATION - PER CARDIOLOGY OFFICE NOTES 06/2012  . Arthritis   . BPH (benign prostatic hyperplasia)    s/p TURP in July 2012  . Complication of anesthesia    HX OF MINOR ITCHING AFTER ANESTHESIA FOR HIP REPLACEMENT AND FOR COLON SURGERY  . EKG abnormalities    INCOMPLETE RIGHT BUNDLE BRANCH BLOCK AND CHRONIC  T-WAVE ABNORMALITIES - PER CARDIOLOGY OFFICE NOTES 06/2012  . GERD (gastroesophageal reflux disease)   . History of paroxysmal supraventricular tachycardia    [name]  . Hypertension   . Palpitations    History of ventricular ectopy. He is status post radiofrequency ablation at the [facility] in the early 90's. for PVC's  . Vertigo, benign positional    [name]    Family History  Problem Relation Age of Onset  . Heart attack Mother   . Other Neg Hx     Past Surgical History:  Procedure Laterality Date  . ANKLE SURGERY    . CARDIAC CATHETERIZATION  2002   NORMAL CORONARY ARTERIES  . COLON SURGERY  ? 1994  . HERNIA REPAIR  1990  . JOINT REPLACEMENT  ? 2009   LEFT HIP REPLACEMENT  . KIDNEY STONE SURGERY    . right shoulder rotator cuff repair    . TONSILLECTOMY  Age 73  . TOTAL HIP ARTHROPLASTY Right 08/04/2013   Procedure: RIGHT TOTAL HIP ARTHROPLASTY ANTERIOR APPROACH;  Surgeon: Mauri Pole, MD;  Location: [facility];  Service: Orthopedics;  Laterality: Right;  . TOTAL KNEE ARTHROPLASTY Bilateral 09/13/2012   Procedure: TOTAL KNEE BILATERAL;  Surgeon: Mauri Pole, MD;  Location: [facility];  Service: Orthopedics;  Laterality: Bilateral;  . TRANSURETHRAL RESECTION OF PROSTATE    . US ECHOCARDIOGRAPHY  06-20-2009   Est EF 55-60%   Social History   Occupational History  . Not on file  Tobacco Use  . Smoking status: Never Smoker  . Smokeless tobacco: Never Used  Substance and Sexual Activity  . Alcohol use: Yes    Alcohol/week: 0.0 oz    Comment: Drink Wine almost daily  . Drug use: No  . Sexual activity: Not on file

## 2017-04-21 DIAGNOSIS — D2339 Other benign neoplasm of skin of other parts of face: Secondary | ICD-10-CM | POA: Diagnosis not present

## 2017-04-21 DIAGNOSIS — Z85828 Personal history of other malignant neoplasm of skin: Secondary | ICD-10-CM | POA: Diagnosis not present

## 2017-04-21 DIAGNOSIS — I788 Other diseases of capillaries: Secondary | ICD-10-CM | POA: Diagnosis not present

## 2017-04-29 DIAGNOSIS — D2339 Other benign neoplasm of skin of other parts of face: Secondary | ICD-10-CM | POA: Diagnosis not present

## 2017-05-13 DIAGNOSIS — Z8679 Personal history of other diseases of the circulatory system: Secondary | ICD-10-CM | POA: Insufficient documentation

## 2017-05-13 DIAGNOSIS — T8859XA Other complications of anesthesia, initial encounter: Secondary | ICD-10-CM | POA: Insufficient documentation

## 2017-05-13 DIAGNOSIS — N400 Enlarged prostate without lower urinary tract symptoms: Secondary | ICD-10-CM | POA: Insufficient documentation

## 2017-05-13 DIAGNOSIS — T4145XA Adverse effect of unspecified anesthetic, initial encounter: Secondary | ICD-10-CM | POA: Insufficient documentation

## 2017-05-13 DIAGNOSIS — H52203 Unspecified astigmatism, bilateral: Secondary | ICD-10-CM | POA: Diagnosis not present

## 2017-05-13 DIAGNOSIS — I10 Essential (primary) hypertension: Secondary | ICD-10-CM | POA: Insufficient documentation

## 2017-05-13 DIAGNOSIS — H8110 Benign paroxysmal vertigo, unspecified ear: Secondary | ICD-10-CM | POA: Insufficient documentation

## 2017-05-13 DIAGNOSIS — R002 Palpitations: Secondary | ICD-10-CM | POA: Insufficient documentation

## 2017-05-13 DIAGNOSIS — R9431 Abnormal electrocardiogram [ECG] [EKG]: Secondary | ICD-10-CM | POA: Insufficient documentation

## 2017-05-13 DIAGNOSIS — Z961 Presence of intraocular lens: Secondary | ICD-10-CM | POA: Diagnosis not present

## 2017-05-13 DIAGNOSIS — M1990 Unspecified osteoarthritis, unspecified site: Secondary | ICD-10-CM | POA: Insufficient documentation

## 2017-05-26 ENCOUNTER — Other Ambulatory Visit: Payer: Self-pay | Admitting: Cardiology

## 2017-05-26 NOTE — Progress Notes (Signed)
HPI: FU SVT and hypertension. He has a past history of having had ablation for frequent PVCs done at the [facility] clinic in the early 1990s. He had a cardiac catheterization in 2001 showing normal coronary arteries. His most recent nuclear stress test was 12/16/12 showing an ejection fraction of 59% and no ischemia or wall motion abnormalities. He does have a chronically abnormal electrocardiogram with abnormal T waves. Repeat monitor 5/17 showed sinus with pvcs and SVT. Echo 7/17 showed normal LV function; grade 1 DD and trace AI. Seen by Dr Caryl Comes and placed on flecanide for atrial tach with improvement in symptoms; seen by Dr Rayann Heman for possible ablation but pt preferred medical therapy. ETT 10/17 showed no exercise induced VT. Since last seen, there is no dyspnea or chest pain. Occasional palpitations not sustained. No syncope. Occasional mild dizziness.  Current Outpatient Medications  Medication Sig Dispense Refill  . celecoxib (CELEBREX) 200 MG capsule Take 200 mg by mouth daily.     . Cholecalciferol (VITAMIN D PO) Take 2,000 mg by mouth daily.     . flecainide (TAMBOCOR) 50 MG tablet TAKE ONE TABLET TWICE DAILY 180 tablet 1  . lansoprazole (PREVACID) 30 MG capsule Take 30 mg by mouth daily as needed (heart  burn).    . losartan (COZAAR) 50 MG tablet TAKE ONE-HALF TABLET DAILY 45 tablet 3  . metoprolol (LOPRESSOR) 50 MG tablet Take by mouth as needed. 25 - 50 MG  IF HEART RATE  GREATER  THAN  90     . metoprolol succinate (TOPROL-XL) 50 MG 24 hr tablet Take 50 mg by mouth daily. 1 TABLET IN THE MORNING AND 1/2 TABLET IN THE EVENINGTake with or immediately following a meal.    . polycarbophil (FIBERCON) 625 MG tablet Take 625 mg by mouth daily as needed for mild constipation.    . potassium citrate (UROCIT-K) 10 MEQ (1080 MG) SR tablet Take 10 mEq by mouth 2 (two) times daily.      No current facility-administered medications for this visit.      Past Medical History:  Diagnosis  Date  . Aortic sclerosis    MILD PER ECHO AS WELL AS TRACE MITRAL REGURGITATION - PER CARDIOLOGY OFFICE NOTES 06/2012  . Arthritis   . BPH (benign prostatic hyperplasia)    s/p TURP in July 2012  . Complication of anesthesia    HX OF MINOR ITCHING AFTER ANESTHESIA FOR HIP REPLACEMENT AND FOR COLON SURGERY  . EKG abnormalities    INCOMPLETE RIGHT BUNDLE BRANCH BLOCK AND CHRONIC T-WAVE ABNORMALITIES - PER CARDIOLOGY OFFICE NOTES 06/2012  . GERD (gastroesophageal reflux disease)   . History of paroxysmal supraventricular tachycardia    [name]  . Hypertension   . Palpitations    History of ventricular ectopy. He is status post radiofrequency ablation at the [facility] in the early 90's. for PVC's  . Vertigo, benign positional    [name]    Past Surgical History:  Procedure Laterality Date  . ANKLE SURGERY    . CARDIAC CATHETERIZATION  2002   NORMAL CORONARY ARTERIES  . COLON SURGERY  ? 1994  . HERNIA REPAIR  1990  . JOINT REPLACEMENT  ? 2009   LEFT HIP REPLACEMENT  . KIDNEY STONE SURGERY    . right shoulder rotator cuff repair    . TONSILLECTOMY  Age 82  . TOTAL HIP ARTHROPLASTY Right  08/04/2013   Procedure: RIGHT TOTAL HIP ARTHROPLASTY ANTERIOR APPROACH;  Surgeon: Mauri Pole, MD;  Location: [facility];  Service: Orthopedics;  Laterality: Right;  . TOTAL KNEE ARTHROPLASTY Bilateral 09/13/2012   Procedure: TOTAL KNEE BILATERAL;  Surgeon: Mauri Pole, MD;  Location: [facility];  Service: Orthopedics;  Laterality: Bilateral;  . TRANSURETHRAL RESECTION OF PROSTATE    . US ECHOCARDIOGRAPHY  06-20-2009   Est EF 55-60%    Social History   Socioeconomic History  . Marital status: Married    Spouse name: Not on file  . Number of children: Not on file  . Years of education: Not on file  . Highest education level: Not on file  Social Needs  . Financial resource strain: Not on file  . Food  insecurity - worry: Not on file  . Food insecurity - inability: Not on file  . Transportation needs - medical: Not on file  . Transportation needs - non-medical: Not on file  Occupational History  . Not on file  Tobacco Use  . Smoking status: Never Smoker  . Smokeless tobacco: Never Used  Substance and Sexual Activity  . Alcohol use: Yes    Alcohol/week: 0.0 oz    Comment: Drink Wine almost daily  . Drug use: No  . Sexual activity: Not on file  Other Topics Concern  . Not on file  Social History Narrative  . Not on file    Family History  Problem Relation Age of Onset  . Heart attack Mother   . Other Neg Hx     ROS: no fevers or chills, productive cough, hemoptysis, dysphasia, odynophagia, melena, hematochezia, dysuria, hematuria, rash, seizure activity, orthopnea, PND, pedal edema, claudication. Remaining systems are negative.  Physical Exam: Well-developed well-nourished in no acute distress.  Skin is warm and dry.  HEENT is normal.  Neck is supple.  Chest is clear to auscultation with normal expansion.  Cardiovascular exam is regular rate and rhythm.  Abdominal exam nontender or distended. No masses palpated. Extremities show no edema. neuro grossly intact  ECG- sinus rhythm, anterior T-wave inversion; no change compared to previous. personally reviewed  A/P  1 atrial tachycardia-patient continues to do well from a symptomatic standpoint with only occasional palpitations. Continue flecainide and metoprolol. We can consider referral for ablation in the future if his symptoms worsen.  2 hypertension-blood pressure is controlled. Continue present medications.  3 dizziness-chronic symptoms and unlikely to be cardiac related.  Kirk Ruths, MD

## 2017-06-01 ENCOUNTER — Encounter (INDEPENDENT_AMBULATORY_CARE_PROVIDER_SITE_OTHER): Payer: Self-pay

## 2017-06-01 ENCOUNTER — Encounter: Payer: Self-pay | Admitting: Cardiology

## 2017-06-01 ENCOUNTER — Ambulatory Visit (INDEPENDENT_AMBULATORY_CARE_PROVIDER_SITE_OTHER): Payer: Medicare Other | Admitting: Cardiology

## 2017-06-01 VITALS — BP 128/60 | HR 56 | Ht 72.0 in | Wt 231.6 lb

## 2017-06-01 DIAGNOSIS — I10 Essential (primary) hypertension: Secondary | ICD-10-CM

## 2017-06-01 DIAGNOSIS — R002 Palpitations: Secondary | ICD-10-CM

## 2017-06-01 DIAGNOSIS — R42 Dizziness and giddiness: Secondary | ICD-10-CM | POA: Diagnosis not present

## 2017-06-01 NOTE — Patient Instructions (Signed)
Your physician wants you to follow-up in: 6 MONTHS WITH DR CRENSHAW You will receive a reminder letter in the mail two months in advance. If you don't receive a letter, please call our office to schedule the follow-up appointment.   If you need a refill on your cardiac medications before your next appointment, please call your pharmacy.  

## 2017-06-08 DIAGNOSIS — M67472 Ganglion, left ankle and foot: Secondary | ICD-10-CM | POA: Diagnosis not present

## 2017-06-08 DIAGNOSIS — M79672 Pain in left foot: Secondary | ICD-10-CM | POA: Diagnosis not present

## 2017-06-11 ENCOUNTER — Telehealth: Payer: Self-pay | Admitting: Cardiology

## 2017-06-11 NOTE — Telephone Encounter (Signed)
Pt c/o BP issue: STAT if pt c/o blurred vision, one-sided weakness or slurred speech  1. What are your last 5 BP readings?  200/99   205/123  2. Are you having any other symptoms (ex. Dizziness, headache, blurred vision, passed out)? Slight headache  3. What is your BP issue? High, pt is very hot

## 2017-06-11 NOTE — Telephone Encounter (Signed)
Patient here and blood pressure is 180/80. He reports he drank 2 glasses of wine and took a viagra last night. Pulse is 60, he is a little diaphoretic, temp 97.4. Patient advised to continue to monitor and if continues to be elevated over the next several days to call back. He forgot to bring his cuff for correlation. Pt agreed with this plan.

## 2017-06-11 NOTE — Telephone Encounter (Signed)
Spoke with pt, he woke this morning at 3 am not feeling well and unable to return to sleep. His bp has been elevated since 7:30 am this morning. 213/99-205/123. He reports just not feeling well and sweaty. He has taken all of his medications. He is going to come by the office for me to check his cuff and bp.

## 2017-06-16 DIAGNOSIS — N401 Enlarged prostate with lower urinary tract symptoms: Secondary | ICD-10-CM | POA: Diagnosis not present

## 2017-06-16 DIAGNOSIS — R310 Gross hematuria: Secondary | ICD-10-CM | POA: Diagnosis not present

## 2017-06-19 DIAGNOSIS — M67472 Ganglion, left ankle and foot: Secondary | ICD-10-CM | POA: Diagnosis not present

## 2017-06-19 DIAGNOSIS — M25572 Pain in left ankle and joints of left foot: Secondary | ICD-10-CM | POA: Diagnosis not present

## 2017-06-26 DIAGNOSIS — R310 Gross hematuria: Secondary | ICD-10-CM | POA: Diagnosis not present

## 2017-06-26 DIAGNOSIS — N132 Hydronephrosis with renal and ureteral calculous obstruction: Secondary | ICD-10-CM | POA: Diagnosis not present

## 2017-07-01 DIAGNOSIS — N200 Calculus of kidney: Secondary | ICD-10-CM | POA: Diagnosis not present

## 2017-07-01 DIAGNOSIS — R310 Gross hematuria: Secondary | ICD-10-CM | POA: Diagnosis not present

## 2017-07-01 DIAGNOSIS — N401 Enlarged prostate with lower urinary tract symptoms: Secondary | ICD-10-CM | POA: Diagnosis not present

## 2017-07-02 ENCOUNTER — Other Ambulatory Visit: Payer: Self-pay | Admitting: Urology

## 2017-07-15 ENCOUNTER — Encounter (HOSPITAL_COMMUNITY): Payer: Self-pay | Admitting: *Deleted

## 2017-07-23 ENCOUNTER — Encounter (HOSPITAL_COMMUNITY)
Admission: RE | Disposition: A | Discharge status: Home / Self Care | Payer: Self-pay | Source: Ambulatory Visit | Attending: Urology

## 2017-07-23 ENCOUNTER — Encounter (HOSPITAL_COMMUNITY): Payer: Self-pay | Admitting: General Practice

## 2017-07-23 ENCOUNTER — Ambulatory Visit (HOSPITAL_COMMUNITY): Payer: Medicare Other

## 2017-07-23 ENCOUNTER — Ambulatory Visit (HOSPITAL_COMMUNITY)
Admission: RE | Admit: 2017-07-23 | Discharge: 2017-07-23 | Disposition: A | Discharge status: Home / Self Care | Payer: Medicare Other | Source: Ambulatory Visit | Attending: Urology | Admitting: Urology

## 2017-07-23 DIAGNOSIS — Z885 Allergy status to narcotic agent status: Secondary | ICD-10-CM | POA: Insufficient documentation

## 2017-07-23 DIAGNOSIS — N201 Calculus of ureter: Secondary | ICD-10-CM | POA: Insufficient documentation

## 2017-07-23 DIAGNOSIS — K219 Gastro-esophageal reflux disease without esophagitis: Secondary | ICD-10-CM | POA: Diagnosis not present

## 2017-07-23 DIAGNOSIS — I4510 Unspecified right bundle-branch block: Secondary | ICD-10-CM | POA: Insufficient documentation

## 2017-07-23 DIAGNOSIS — I10 Essential (primary) hypertension: Secondary | ICD-10-CM | POA: Insufficient documentation

## 2017-07-23 DIAGNOSIS — N400 Enlarged prostate without lower urinary tract symptoms: Secondary | ICD-10-CM | POA: Diagnosis not present

## 2017-07-23 DIAGNOSIS — H8110 Benign paroxysmal vertigo, unspecified ear: Secondary | ICD-10-CM | POA: Diagnosis not present

## 2017-07-23 DIAGNOSIS — Z79899 Other long term (current) drug therapy: Secondary | ICD-10-CM | POA: Insufficient documentation

## 2017-07-23 DIAGNOSIS — I471 Supraventricular tachycardia: Secondary | ICD-10-CM | POA: Insufficient documentation

## 2017-07-23 HISTORY — PX: EXTRACORPOREAL SHOCK WAVE LITHOTRIPSY: SHX1557

## 2017-07-23 IMAGING — CR DG ABDOMEN 1V
2 series · 2 of 2 positions shown · non-contrast
Comparison: Abdominal CT [DATE]

CLINICAL DATA: Pre lithotripsy for right-sided ureteral stone.

EXAM:
ABDOMEN - 1 VIEW

[t abdomen supine (1 of 2)]
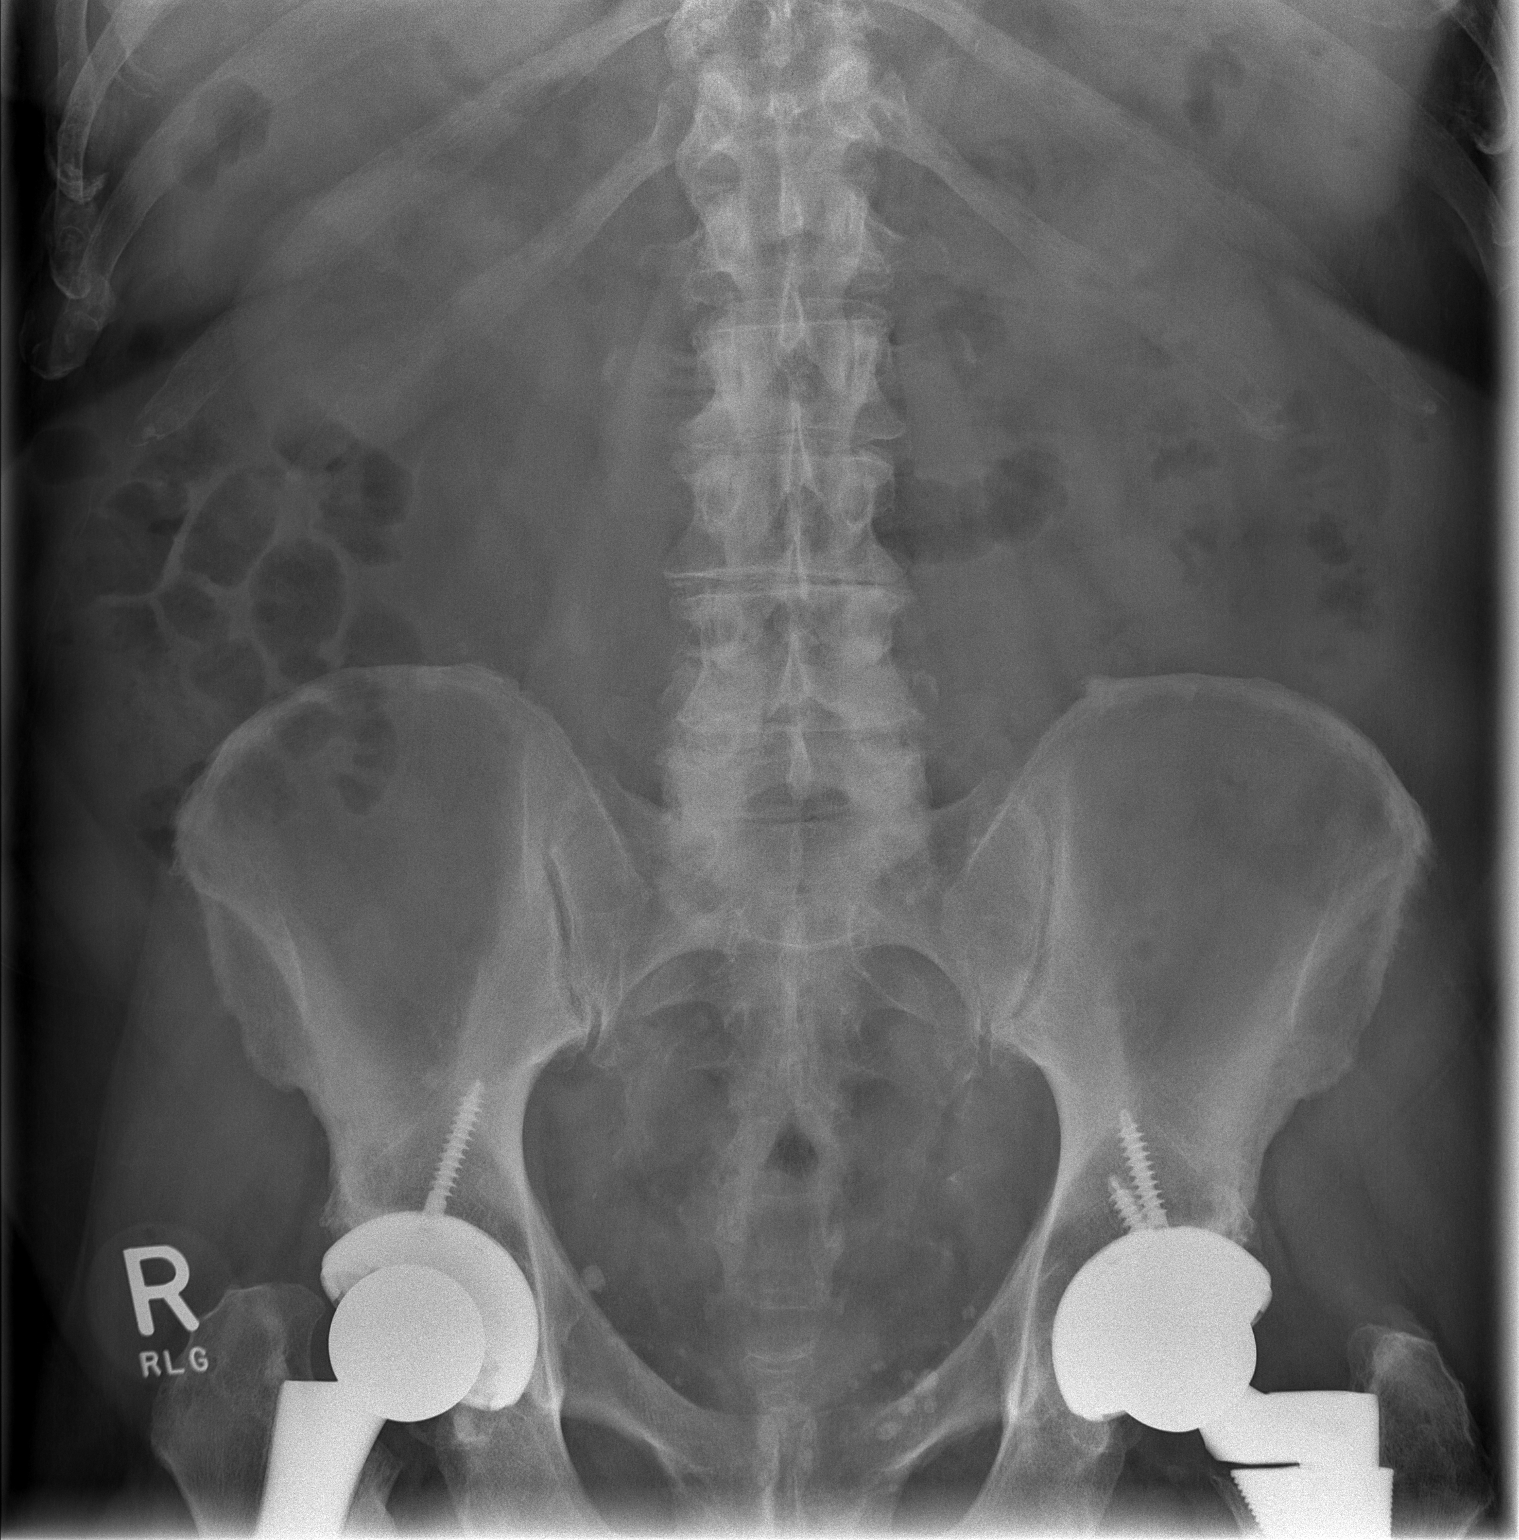

[t abdomen supine (2 of 2)]
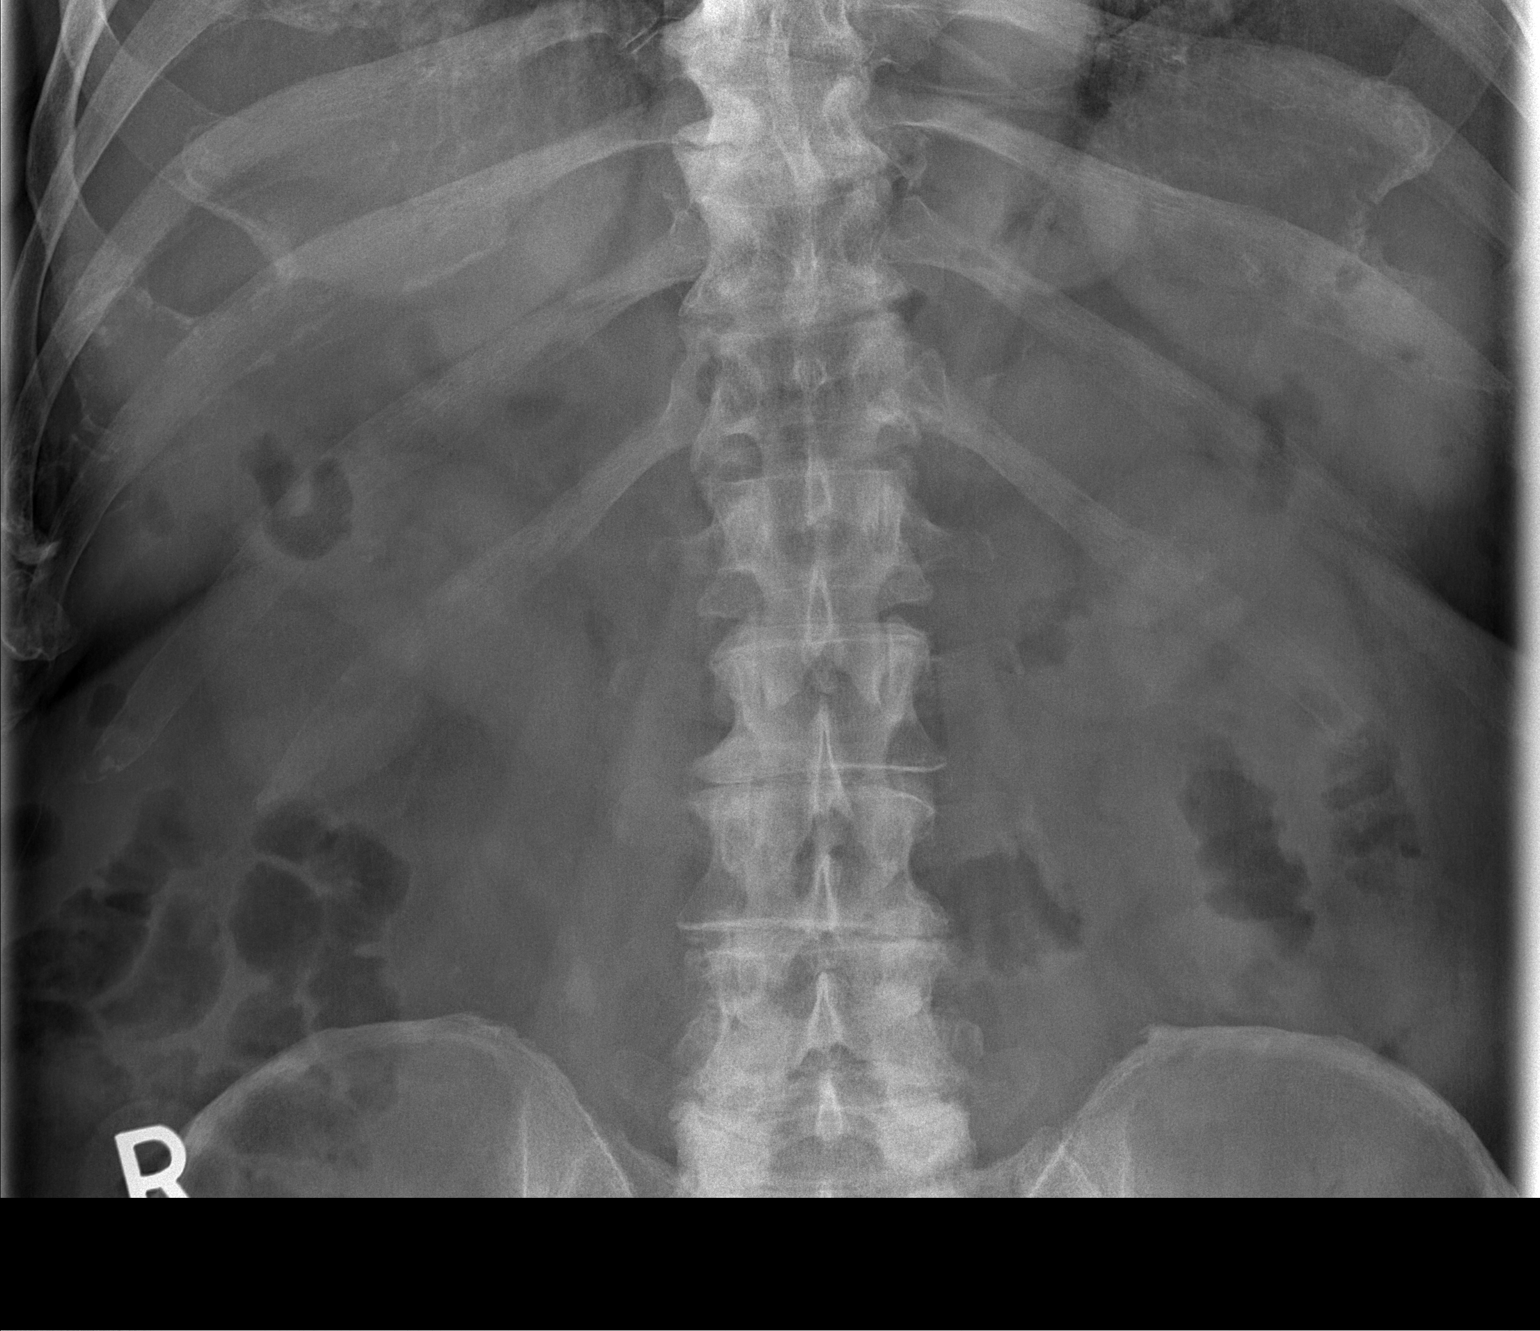

[2 of 2 positions shown; findings below may reference images not displayed]

FINDINGS: 14 mm long ovoid calcification right of L4 which correlates with
previously seen ureteral stone. Known bilateral nephrolithiasis is
subtly visualized due to overlapping bowel gas. At least 2 right
renal calculi are seen, up to 3 mm. Subtle left upper pole calculus
measuring 8 mm. Multiple pelvic phleboliths.
IMPRESSION: 1. 14 mm right ureteral stone at the level of L4, similar position
to CT [DATE].
[DATE]. Known bilateral nephrolithiasis.

## 2017-07-23 SURGERY — LITHOTRIPSY, ESWL
Anesthesia: LOCAL | Laterality: Right

## 2017-07-23 MED ORDER — LEVOFLOXACIN 500 MG PO TABS
500.0000 mg | ORAL_TABLET | ORAL | Status: AC
  Administered 2017-07-23: 500 mg via ORAL
  Filled 2017-07-23 (×2): qty 1

## 2017-07-23 MED ORDER — SODIUM CHLORIDE 0.9 % IV SOLN
INTRAVENOUS | Status: DC
  Administered 2017-07-23: 07:00:00 via INTRAVENOUS

## 2017-07-23 MED ORDER — DIPHENHYDRAMINE HCL 25 MG PO CAPS
25.0000 mg | ORAL_CAPSULE | ORAL | Status: AC
  Administered 2017-07-23: 25 mg via ORAL
  Filled 2017-07-23: qty 1

## 2017-07-23 MED ORDER — DIAZEPAM 5 MG PO TABS
10.0000 mg | ORAL_TABLET | ORAL | Status: AC
  Administered 2017-07-23: 10 mg via ORAL
  Filled 2017-07-23: qty 2

## 2017-07-23 NOTE — Discharge Instructions (Signed)
1 - You may have urinary urgency (bladder spasms) and bloody urine on / off and pass small stone fragments for up to 2 week. This is normal.  2 - Call MD or go to ER for fever >102, severe pain / nausea / vomiting not relieved by medications, or acute change in medical status

## 2017-07-23 NOTE — Brief Op Note (Signed)
07/23/2017  8:39 AM  PATIENT:  Kathie Dike  82 y.o. male  PRE-OPERATIVE DIAGNOSIS:  RIGHT URETERAL STONES  POST-OPERATIVE DIAGNOSIS:  * No post-op diagnosis entered *  PROCEDURE:  Procedure(s): RIGHT EXTRACORPOREAL SHOCK WAVE LITHOTRIPSY (ESWL) (Right)  SURGEON:  Surgeon(s) and Role:    * Alexis Frock, MD - Primary  PHYSICIAN ASSISTANT:   ASSISTANTS: none   ANESTHESIA:   MAC  EBL:  none  BLOOD ADMINISTERED:none  DRAINS: none   LOCAL MEDICATIONS USED:  NONE  SPECIMEN:  No Specimen  DISPOSITION OF SPECIMEN:  N/A  COUNTS:  YES  TOURNIQUET:  * No tourniquets in log *  DICTATION: .Note written in paper chart  PLAN OF CARE: Discharge to home after PACU  PATIENT DISPOSITION:  Short Stay   Delay start of Pharmacological VTE agent (>24hrs) due to surgical blood loss or risk of bleeding: yes

## 2017-07-23 NOTE — H&P (Signed)
[name] [name] is an 82 y.o. male.    Chief Complaint: Pre-Op RIGHT Shockwave Lithotripsy  HPI:   1 - RIGHT Proximal Ureteral Stone - 21mm Rt prox stone wih mild-mod hydro by imaging 2019. Stone is 2mm, 5980HU, SSD 15cm and at Rt L4 area. No recent fevers.  Today "[name] [name]" is seen to proceed with shockwave lithotripsy.   Past Medical History:  Diagnosis Date  . Aortic sclerosis    MILD PER ECHO AS WELL AS TRACE MITRAL REGURGITATION - PER CARDIOLOGY OFFICE NOTES 06/2012  . Arthritis   . BPH (benign prostatic hyperplasia)    s/p TURP in July 2012  . Complication of anesthesia    HX OF MINOR ITCHING AFTER ANESTHESIA FOR HIP REPLACEMENT AND FOR COLON SURGERY  . EKG abnormalities    INCOMPLETE RIGHT BUNDLE BRANCH BLOCK AND CHRONIC T-WAVE ABNORMALITIES - PER CARDIOLOGY OFFICE NOTES 06/2012  . GERD (gastroesophageal reflux disease)   . History of paroxysmal supraventricular tachycardia    [name]  . Hypertension   . Palpitations    History of ventricular ectopy. He is status post radiofrequency ablation at the [facility] in the early 90's. for PVC's  . Vertigo, benign positional    [name]    Past Surgical History:  Procedure Laterality Date  . ANKLE SURGERY    . CARDIAC CATHETERIZATION  2002   NORMAL CORONARY ARTERIES  . COLON SURGERY  ? 1994  . HERNIA REPAIR  1990  . JOINT REPLACEMENT  ? 2009   LEFT HIP REPLACEMENT  . KIDNEY STONE SURGERY    . right shoulder rotator cuff repair    . TONSILLECTOMY  Age 78  . TOTAL HIP ARTHROPLASTY Right 08/04/2013   Procedure: RIGHT TOTAL HIP ARTHROPLASTY ANTERIOR APPROACH;  Surgeon: Mauri Pole, MD;  Location: [facility];  Service: Orthopedics;  Laterality: Right;  . TOTAL KNEE ARTHROPLASTY Bilateral 09/13/2012   Procedure: TOTAL KNEE BILATERAL;  Surgeon: Mauri Pole, MD;  Location: [facility];  Service: Orthopedics;  Laterality: Bilateral;  . TRANSURETHRAL  RESECTION OF PROSTATE    . US ECHOCARDIOGRAPHY  06-20-2009   Est EF 55-60%    Family History  Problem Relation Age of Onset  . Heart attack Mother   . Other Neg Hx    Social History:  reports that  has never smoked. he has never used smokeless tobacco. He reports that he drinks alcohol. He reports that he does not use drugs.  Allergies:  Allergies  Allergen Reactions  . Hydrocodone-Acetaminophen Other (See Comments)    Confusion  . Oxycodone Other (See Comments)    Angry and confused  . Ramipril     Cough    Medications Prior to Admission  Medication Sig Dispense Refill  . flecainide (TAMBOCOR) 50 MG tablet TAKE ONE TABLET TWICE DAILY 180 tablet 1  . losartan (COZAAR) 50 MG tablet TAKE ONE-HALF TABLET DAILY 45 tablet 3  . metoprolol (LOPRESSOR) 50 MG tablet Take by mouth as needed. 25 - 50 MG  IF HEART RATE  GREATER  THAN  90     . metoprolol succinate (TOPROL-XL) 50 MG 24 hr tablet Take 50 mg by mouth daily. 1 TABLET IN THE MORNING AND 1/2 TABLET IN THE EVENINGTake with or immediately following a meal.    . celecoxib (CELEBREX) 200 MG capsule Take 200 mg by mouth daily.     . Cholecalciferol (VITAMIN D PO) Take  2,000 mg by mouth daily.     . lansoprazole (PREVACID) 30 MG capsule Take 30 mg by mouth daily as needed (heart  burn).    . polycarbophil (FIBERCON) 625 MG tablet Take 625 mg by mouth daily as needed for mild constipation.    . potassium citrate (UROCIT-K) 10 MEQ (1080 MG) SR tablet Take 10 mEq by mouth 2 (two) times daily.       No results found for this or any previous visit (from the past 48 hour(s)). No results found.  Review of Systems  Constitutional: Negative.  Negative for chills and fever.  HENT: Negative.   Eyes: Negative.   Respiratory: Negative.   Cardiovascular: Negative.   Gastrointestinal: Negative.   Genitourinary: Positive for flank pain.  Skin: Negative.   Neurological: Negative.   Endo/Heme/Allergies: Negative.   Psychiatric/Behavioral:  Negative.     Blood pressure (!) 146/89, pulse (!) 56, temperature 97.7 F (36.5 C), temperature source Oral, resp. rate 18, height 6' (1.829 m), weight 104.3 kg (230 lb), SpO2 96 %. Physical Exam  Constitutional: He appears well-developed.  HENT:  Head: Normocephalic.  Eyes: Pupils are equal, round, and reactive to light.  Neck: Normal range of motion.  GI: Soft.  Genitourinary:  Genitourinary Comments: Mild Rt CVAT  Musculoskeletal: Normal range of motion.  Neurological: He is alert.  Skin: Skin is warm.  Psychiatric: He has a normal mood and affect.     Assessment/Plan  1 -Right Ureteral Stone - proceed as planned with shockwave lithotripsy. Risks, benefits, alternatives, expected peri-op course discussed previously and reiterated today.   [place]is Frock, MD 07/23/2017, 7:05 AM

## 2017-07-24 ENCOUNTER — Encounter (HOSPITAL_COMMUNITY): Payer: Self-pay | Admitting: Urology

## 2017-07-31 DIAGNOSIS — N200 Calculus of kidney: Secondary | ICD-10-CM | POA: Diagnosis not present

## 2017-08-04 ENCOUNTER — Other Ambulatory Visit: Payer: Self-pay | Admitting: Orthopedic Surgery

## 2017-08-04 DIAGNOSIS — M67472 Ganglion, left ankle and foot: Secondary | ICD-10-CM | POA: Diagnosis not present

## 2017-08-04 DIAGNOSIS — G8918 Other acute postprocedural pain: Secondary | ICD-10-CM | POA: Diagnosis not present

## 2017-08-13 DIAGNOSIS — N200 Calculus of kidney: Secondary | ICD-10-CM | POA: Diagnosis not present

## 2017-08-17 DIAGNOSIS — N200 Calculus of kidney: Secondary | ICD-10-CM | POA: Diagnosis not present

## 2017-09-11 DIAGNOSIS — Z85828 Personal history of other malignant neoplasm of skin: Secondary | ICD-10-CM | POA: Diagnosis not present

## 2017-09-11 DIAGNOSIS — L570 Actinic keratosis: Secondary | ICD-10-CM | POA: Diagnosis not present

## 2017-09-11 DIAGNOSIS — D485 Neoplasm of uncertain behavior of skin: Secondary | ICD-10-CM | POA: Diagnosis not present

## 2017-09-11 DIAGNOSIS — L718 Other rosacea: Secondary | ICD-10-CM | POA: Diagnosis not present

## 2017-09-11 DIAGNOSIS — L821 Other seborrheic keratosis: Secondary | ICD-10-CM | POA: Diagnosis not present

## 2017-09-25 DIAGNOSIS — Z85828 Personal history of other malignant neoplasm of skin: Secondary | ICD-10-CM | POA: Diagnosis not present

## 2017-09-25 DIAGNOSIS — D485 Neoplasm of uncertain behavior of skin: Secondary | ICD-10-CM | POA: Diagnosis not present

## 2017-11-10 DIAGNOSIS — N5201 Erectile dysfunction due to arterial insufficiency: Secondary | ICD-10-CM | POA: Diagnosis not present

## 2017-11-10 DIAGNOSIS — N281 Cyst of kidney, acquired: Secondary | ICD-10-CM | POA: Diagnosis not present

## 2017-11-10 DIAGNOSIS — R351 Nocturia: Secondary | ICD-10-CM | POA: Diagnosis not present

## 2017-11-10 DIAGNOSIS — N200 Calculus of kidney: Secondary | ICD-10-CM | POA: Diagnosis not present

## 2017-11-17 DIAGNOSIS — I10 Essential (primary) hypertension: Secondary | ICD-10-CM | POA: Diagnosis not present

## 2017-11-17 DIAGNOSIS — Z9989 Dependence on other enabling machines and devices: Secondary | ICD-10-CM | POA: Diagnosis not present

## 2017-11-17 DIAGNOSIS — R072 Precordial pain: Secondary | ICD-10-CM | POA: Diagnosis not present

## 2017-11-17 DIAGNOSIS — R079 Chest pain, unspecified: Secondary | ICD-10-CM | POA: Diagnosis not present

## 2017-11-23 ENCOUNTER — Other Ambulatory Visit: Payer: Self-pay | Admitting: Cardiology

## 2017-11-23 DIAGNOSIS — M5030 Other cervical disc degeneration, unspecified cervical region: Secondary | ICD-10-CM | POA: Diagnosis not present

## 2017-11-23 DIAGNOSIS — M5412 Radiculopathy, cervical region: Secondary | ICD-10-CM | POA: Diagnosis not present

## 2017-11-23 DIAGNOSIS — M542 Cervicalgia: Secondary | ICD-10-CM | POA: Diagnosis not present

## 2017-11-23 NOTE — Telephone Encounter (Signed)
Rx sent to pharmacy   

## 2017-11-24 ENCOUNTER — Encounter: Payer: Self-pay | Admitting: Physician Assistant

## 2017-11-24 ENCOUNTER — Ambulatory Visit (INDEPENDENT_AMBULATORY_CARE_PROVIDER_SITE_OTHER): Payer: Medicare Other | Admitting: Physician Assistant

## 2017-11-24 VITALS — BP 142/76 | HR 50 | Ht 72.0 in | Wt 226.2 lb

## 2017-11-24 DIAGNOSIS — I10 Essential (primary) hypertension: Secondary | ICD-10-CM

## 2017-11-24 DIAGNOSIS — I471 Supraventricular tachycardia: Secondary | ICD-10-CM | POA: Diagnosis not present

## 2017-11-24 DIAGNOSIS — R079 Chest pain, unspecified: Secondary | ICD-10-CM | POA: Diagnosis not present

## 2017-11-24 DIAGNOSIS — M79601 Pain in right arm: Secondary | ICD-10-CM | POA: Diagnosis not present

## 2017-11-24 MED ORDER — LOSARTAN POTASSIUM 50 MG PO TABS: 50.0000 mg | ORAL_TABLET | Freq: Every day | ORAL | 3 refills | Status: DC

## 2017-11-24 NOTE — Progress Notes (Signed)
Cardiology Office Note    Date:  11/26/2017   ID:  [name] [name], DOB Jun 01, 1935, MRN [id number]  PCP:  Josetta Huddle, MD  Cardiologist:  [name]   Chief Complaint  Patient presents with  . Follow-up    recent ED visit in [place], seen for [name].     History of Present Illness:  [name] [name] is a 82 y.o. male with PMH of HTN and SVT.  He had a prior history of ablation for frequent PVCs done at [facility] clinic in early 1990s.  He also had a cardiac catheterization in 2001 that showed normal coronary arteries.  Most recent Myoview in July 2014 showed EF 59%, no ischemia or wall motion abnormality.  He has chronically abnormal EKG with abnormal T waves.  Heart monitor in May 2017 shows sinus rhythm with PVCs and SVT.  Echocardiogram in July 2017 showed normal LV function, grade 1 DD, trace AI.  He has been seen by Dr. Caryl Comes and was placed on flecainide for atrial tachycardia with improvement in the symptoms.  He was also seen by Dr. Rayann Heman for possible ablation but patient preferred medical therapy.  ETT in October 2017 showed no exercise-induced VT.  He was last seen by [name] in January 2019, he was doing well at the time.  He does have chronic dizziness, which was felt not to be cardiac related.  Six-month follow-up was recommended.  More recently, patient was seen at [facility] in [place] on 11/17/2017 for chest pain and hypertension.  His troponin I was normal.  Complete metabolic panel showed normal liver function, normal electrolytes, creatinine of 1.4.  BMP was normal.  CBC normal as well.  According to patient, he had 3 serial troponin in the hospital, while which was borderline elevated.  At the time, he had sudden onset of severe right arm pain, right chest pain, back pain and right shoulder pain.  This lasted several minutes.  After he was discharged from the emergency room, he had 2 more episodes of right chest pain in the right arm pain.  Since then chest  pain has resolved.  He denies any exacerbating factors for the chest pain however states the right arm pain has persisted and it seems to be worse when he looks up.  There is some concern that he may have a compressed nerve in the neck.  However he has not been able to reproduce the chest discomfort.  I recommend we proceed with a treadmill nuclear stress test for his atypical chest pain.  If negative, then he will need to focus on spinal issues.    Past Medical History:  Diagnosis Date  . Aortic sclerosis    MILD PER ECHO AS WELL AS TRACE MITRAL REGURGITATION - PER CARDIOLOGY OFFICE NOTES 06/2012  . Arthritis   . BPH (benign prostatic hyperplasia)    s/p TURP in July 2012  . Complication of anesthesia    HX OF MINOR ITCHING AFTER ANESTHESIA FOR HIP REPLACEMENT AND FOR COLON SURGERY  . EKG abnormalities    INCOMPLETE RIGHT BUNDLE BRANCH BLOCK AND CHRONIC T-WAVE ABNORMALITIES - PER CARDIOLOGY OFFICE NOTES 06/2012  . GERD (gastroesophageal reflux disease)   . History of paroxysmal supraventricular tachycardia    [name]  . Hypertension   . Palpitations    History of ventricular ectopy. He is status post radiofrequency ablation at the [facility] in the early 90's. for PVC's  . Vertigo,  benign positional    [name]    Past Surgical History:  Procedure Laterality Date  . ANKLE SURGERY    . CARDIAC CATHETERIZATION  2002   NORMAL CORONARY ARTERIES  . COLON SURGERY  ? 1994  . EXTRACORPOREAL SHOCK WAVE LITHOTRIPSY Right 07/23/2017   Procedure: RIGHT EXTRACORPOREAL SHOCK WAVE LITHOTRIPSY (ESWL);  Surgeon: Alexis Frock, MD;  Location: [facility];  Service: Urology;  Laterality: Right;  . HERNIA REPAIR  1990  . JOINT REPLACEMENT  ? 2009   LEFT HIP REPLACEMENT  . KIDNEY STONE SURGERY    . right shoulder rotator cuff repair    . TONSILLECTOMY  Age 9  . TOTAL HIP ARTHROPLASTY Right 08/04/2013   Procedure:  RIGHT TOTAL HIP ARTHROPLASTY ANTERIOR APPROACH;  Surgeon: Mauri Pole, MD;  Location: [facility];  Service: Orthopedics;  Laterality: Right;  . TOTAL KNEE ARTHROPLASTY Bilateral 09/13/2012   Procedure: TOTAL KNEE BILATERAL;  Surgeon: Mauri Pole, MD;  Location: [facility];  Service: Orthopedics;  Laterality: Bilateral;  . TRANSURETHRAL RESECTION OF PROSTATE    . US ECHOCARDIOGRAPHY  06-20-2009   Est EF 55-60%    Current Medications: Outpatient Medications Prior to Visit  Medication Sig Dispense Refill  . celecoxib (CELEBREX) 200 MG capsule Take 200 mg by mouth daily.     . Cholecalciferol (VITAMIN D PO) Take 2,000 mg by mouth daily.     . flecainide (TAMBOCOR) 50 MG tablet Take 50 mg by mouth 2 (two) times daily.    . lansoprazole (PREVACID) 30 MG capsule Take 30 mg by mouth daily as needed (heart  burn).    . metoprolol (LOPRESSOR) 50 MG tablet Take 50 mg by mouth as needed. 25 - 50 MG  IF HEART RATE  GREATER  THAN  90     . polycarbophil (FIBERCON) 625 MG tablet Take 625 mg by mouth daily as needed for mild constipation.    . potassium citrate (UROCIT-K) 10 MEQ (1080 MG) SR tablet Take 10 mEq by mouth daily.     . predniSONE (DELTASONE) 5 MG tablet prednisone 5 mg tablets in a dose pack  Take as directed for 12 days    . losartan (COZAAR) 50 MG tablet TAKE ONE-HALF TABLET DAILY 45 tablet 3  . flecainide (TAMBOCOR) 50 MG tablet TAKE ONE TABLET TWICE DAILY (Patient not taking: Reported on 11/24/2017) 180 tablet 1  . metoprolol succinate (TOPROL-XL) 50 MG 24 hr tablet Take 50 mg by mouth daily. 1 TABLET IN THE MORNING AND 1/2 TABLET IN THE EVENINGTake with or immediately following a meal.     No facility-administered medications prior to visit.      Allergies:   Hydrocodone-acetaminophen; Oxycodone; and Ramipril   Social History   Socioeconomic History  . Marital status: Married    Spouse name: Not on file  . Number of children: Not on file  . Years of education: Not on file  . Highest  education level: Not on file  Occupational History  . Not on file  Social Needs  . Financial resource strain: Not on file  . Food insecurity:    Worry: Not on file    Inability: Not on file  . Transportation needs:    Medical: Not on file    Non-medical: Not on file  Tobacco Use  . Smoking status: Never Smoker  . Smokeless tobacco: Never Used  Substance and Sexual Activity  . Alcohol use: Yes  Alcohol/week: 0.0 oz    Comment: Drink Wine almost daily  . Drug use: No  . Sexual activity: Not on file  Lifestyle  . Physical activity:    Days per week: Not on file    Minutes per session: Not on file  . Stress: Not on file  Relationships  . Social connections:    Talks on phone: Not on file    Gets together: Not on file    Attends religious service: Not on file    Active member of club or organization: Not on file    Attends meetings of clubs or organizations: Not on file    Relationship status: Not on file  Other Topics Concern  . Not on file  Social History Narrative  . Not on file     Family History:  The patient's family history includes Heart attack in his mother.   ROS:   Please see the history of present illness.    ROS All other systems reviewed and are negative.   PHYSICAL EXAM:   VS:  BP (!) 142/76   Pulse (!) 50   Ht 6' (1.829 m)   Wt 226 lb 3.2 oz (102.6 kg)   SpO2 96%   BMI 30.68 kg/m    GEN: Well nourished, well developed, in no acute distress  HEENT: normal  Neck: no JVD, carotid bruits, or masses Cardiac: RRR; no murmurs, rubs, or gallops,no edema  Respiratory:  clear to auscultation bilaterally, normal work of breathing GI: soft, nontender, nondistended, + BS MS: no deformity or atrophy  Skin: warm and dry, no rash Neuro:  Alert and Oriented x 3, Strength and sensation are intact Psych: euthymic mood, full affect  Wt Readings from Last 3 Encounters:  11/24/17 226 lb 3.2 oz (102.6 kg)  07/23/17 230 lb (104.3 kg)  06/01/17 231 lb 9.6 oz  (105.1 kg)      Studies/Labs Reviewed:   EKG:  EKG is ordered today.  The ekg ordered today demonstrates sinus bradycardia, chronic TWI in anterolateral leads, unchanged  Recent Labs: No results found for requested labs within last 8760 hours.   Lipid Panel No results found for: CHOL, TRIG, HDL, CHOLHDL, VLDL, LDLCALC, LDLDIRECT  Additional studies/ records that were reviewed today include:   Echo 12/05/2015 LV EF: 55% -   60% - Study Conclusions  - Left ventricle: The cavity size was normal. There was mild   concentric hypertrophy. Systolic function was normal. The   estimated ejection fraction was in the range of 55% to 60%. Wall   motion was normal; there were no regional wall motion   abnormalities. Doppler parameters are consistent with abnormal   left ventricular relaxation (grade 1 diastolic dysfunction). - Aortic valve: There was trivial regurgitation.   ASSESSMENT:    1. Chest pain with moderate risk for cardiac etiology   2. Essential hypertension   3. SVT (supraventricular tachycardia) (HCC)   4. Pain of right upper extremity      PLAN:  In order of problems listed above:  1. Chest pain: He had a multiple episode of chest pain.  According to the patient, he was seen at our emergency room in [place] and had 3 serial troponin and the middle of which was borderline elevated.  The record that was brought with the patient included mainly patient education form.  There was a troponin that was obtained but that was negative.  I will request full record. He says he had 2 more episodes of  chest pain after he left the emergency room.  He denies any reproducible factors that contribute to the chest pain.  I will proceed with a treadmill nuclear stress test.  2. Right arm pain.  He had significant neck pain in the right arm pain that has been persistent since his ED visit.  The right arm pain is quite atypical, and it is worse when he looks up and to compress his neck.   There may be a nerve that is contributing to his symptoms  3. SVT: Quite well rate controlled on current medication  4. Hypertension: There was over 200 when he arrived in the ED, however has since improved.  Blood pressure remain borderline elevated, increase losartan to 50 mg daily.    Medication Adjustments/Labs and Tests Ordered: Current medicines are reviewed at length with the patient today.  Concerns regarding medicines are outlined above.  Medication changes, Labs and Tests ordered today are listed in the Patient Instructions below. Patient Instructions  Medication Instructions:  INCREASE Losartan to 50 mg daily  Testing/Procedures: Your physician has requested that you have an exercise stress myoview. For further information please visit [email or web address]. Please follow instruction sheet, as given.  Meds to hold: metoprolol  Follow-Up: 3 months with [name] (sooner if needed)  Any Other Special Instructions Will Be Listed Below (If Applicable).     If you need a refill on your cardiac medications before your next appointment, please call your pharmacy.      Hilbert Corrigan, [place]  11/26/2017 11:42 PM    [address], [place], [place]  [address] Phone: [phone]; Fax: [phone]

## 2017-11-24 NOTE — Patient Instructions (Addendum)
Medication Instructions:  INCREASE Losartan to 50 mg daily  Testing/Procedures: Your physician has requested that you have an exercise stress myoview. For further information please visit [email or web address]. Please follow instruction sheet, as given.  Meds to hold: metoprolol  Follow-Up: 3 months with [name] (sooner if needed)  Any Other Special Instructions Will Be Listed Below (If Applicable).     If you need a refill on your cardiac medications before your next appointment, please call your pharmacy.

## 2017-11-25 ENCOUNTER — Telehealth (HOSPITAL_COMMUNITY): Payer: Self-pay

## 2017-11-25 NOTE — Telephone Encounter (Signed)
Encounter complete. 

## 2017-11-26 ENCOUNTER — Encounter: Payer: Self-pay | Admitting: Physician Assistant

## 2017-11-27 ENCOUNTER — Ambulatory Visit (HOSPITAL_COMMUNITY)
Admission: RE | Admit: 2017-11-27 | Discharge: 2017-11-27 | Disposition: A | Discharge status: Home / Self Care | Payer: Medicare Other | Source: Ambulatory Visit | Attending: Cardiology | Admitting: Cardiology

## 2017-11-27 DIAGNOSIS — R079 Chest pain, unspecified: Secondary | ICD-10-CM | POA: Insufficient documentation

## 2017-11-27 LAB — MYOCARDIAL PERFUSION IMAGING
CHL CUP MPHR: 141 {beats}/min
CHL CUP NUCLEAR SRS: 0
CHL RATE OF PERCEIVED EXERTION: 19
CSEPED: 6 min
CSEPEDS: 40 s
CSEPEW: 7 METS
CSEPPHR: 136 {beats}/min
LV dias vol: 109 mL (ref 62–150)
LV sys vol: 58 mL
NUC STRESS TID: 1
Percent HR: 96 %
Rest HR: 49 {beats}/min
SDS: 0
SSS: 0

## 2017-11-27 MED ORDER — TECHNETIUM TC 99M TETROFOSMIN IV KIT
10.8000 | PACK | Freq: Once | INTRAVENOUS | Status: AC | PRN
  Administered 2017-11-27: 10.8 via INTRAVENOUS
  Filled 2017-11-27: qty 11

## 2017-11-27 MED ORDER — REGADENOSON 0.4 MG/5ML IV SOLN: 0.4000 mg | Freq: Once | INTRAVENOUS | Status: DC

## 2017-11-27 MED ORDER — TECHNETIUM TC 99M TETROFOSMIN IV KIT
31.6000 | PACK | Freq: Once | INTRAVENOUS | Status: AC | PRN
  Administered 2017-11-27: 31.6 via INTRAVENOUS
  Filled 2017-11-27: qty 32

## 2017-12-01 ENCOUNTER — Other Ambulatory Visit: Payer: Self-pay

## 2017-12-01 DIAGNOSIS — R0789 Other chest pain: Secondary | ICD-10-CM

## 2017-12-01 DIAGNOSIS — R002 Palpitations: Secondary | ICD-10-CM

## 2017-12-01 NOTE — Progress Notes (Signed)
Notes recorded by Almyra Deforest, PA on 11/30/2017 at 10:56 AM EDT Overall, reassuring result as this stress test does not show any significant reversible blockage to explain the recent symptom. His recent symptom was atypical in nature. The only slight abnormality on this stress test is the quoted pumping function of heart was borderline low at 46%, this type of stress test often tend to underestimate or overestimate the true pumping function, therefore I would recommend an echocardiogram to better confirm the true pumping function.

## 2017-12-03 ENCOUNTER — Other Ambulatory Visit: Payer: Self-pay

## 2017-12-03 ENCOUNTER — Ambulatory Visit (HOSPITAL_COMMUNITY): Payer: Medicare Other | Attending: Physician Assistant

## 2017-12-03 DIAGNOSIS — I471 Supraventricular tachycardia: Secondary | ICD-10-CM | POA: Insufficient documentation

## 2017-12-03 DIAGNOSIS — R0789 Other chest pain: Secondary | ICD-10-CM

## 2017-12-03 DIAGNOSIS — I340 Nonrheumatic mitral (valve) insufficiency: Secondary | ICD-10-CM | POA: Insufficient documentation

## 2017-12-03 DIAGNOSIS — I10 Essential (primary) hypertension: Secondary | ICD-10-CM | POA: Insufficient documentation

## 2017-12-03 DIAGNOSIS — M542 Cervicalgia: Secondary | ICD-10-CM | POA: Diagnosis not present

## 2017-12-03 DIAGNOSIS — I371 Nonrheumatic pulmonary valve insufficiency: Secondary | ICD-10-CM | POA: Diagnosis not present

## 2017-12-03 DIAGNOSIS — R002 Palpitations: Secondary | ICD-10-CM | POA: Diagnosis not present

## 2017-12-04 ENCOUNTER — Other Ambulatory Visit: Payer: Self-pay | Admitting: Cardiology

## 2017-12-04 NOTE — Telephone Encounter (Signed)
Rx request sent to pharmacy.  

## 2018-01-01 DIAGNOSIS — C4441 Basal cell carcinoma of skin of scalp and neck: Secondary | ICD-10-CM | POA: Diagnosis not present

## 2018-01-01 DIAGNOSIS — L570 Actinic keratosis: Secondary | ICD-10-CM | POA: Diagnosis not present

## 2018-01-01 DIAGNOSIS — L821 Other seborrheic keratosis: Secondary | ICD-10-CM | POA: Diagnosis not present

## 2018-01-01 DIAGNOSIS — Z85828 Personal history of other malignant neoplasm of skin: Secondary | ICD-10-CM | POA: Diagnosis not present

## 2018-01-01 DIAGNOSIS — C44212 Basal cell carcinoma of skin of right ear and external auricular canal: Secondary | ICD-10-CM | POA: Diagnosis not present

## 2018-01-01 DIAGNOSIS — L918 Other hypertrophic disorders of the skin: Secondary | ICD-10-CM | POA: Diagnosis not present

## 2018-01-06 DIAGNOSIS — M5030 Other cervical disc degeneration, unspecified cervical region: Secondary | ICD-10-CM | POA: Diagnosis not present

## 2018-01-09 DIAGNOSIS — G5621 Lesion of ulnar nerve, right upper limb: Secondary | ICD-10-CM | POA: Diagnosis not present

## 2018-01-27 DIAGNOSIS — Z0001 Encounter for general adult medical examination with abnormal findings: Secondary | ICD-10-CM | POA: Diagnosis not present

## 2018-01-27 DIAGNOSIS — Z86010 Personal history of colonic polyps: Secondary | ICD-10-CM | POA: Diagnosis not present

## 2018-01-27 DIAGNOSIS — I10 Essential (primary) hypertension: Secondary | ICD-10-CM | POA: Diagnosis not present

## 2018-01-27 DIAGNOSIS — Z23 Encounter for immunization: Secondary | ICD-10-CM | POA: Diagnosis not present

## 2018-01-27 DIAGNOSIS — M542 Cervicalgia: Secondary | ICD-10-CM | POA: Diagnosis not present

## 2018-01-27 DIAGNOSIS — Z1389 Encounter for screening for other disorder: Secondary | ICD-10-CM | POA: Diagnosis not present

## 2018-01-27 DIAGNOSIS — M79609 Pain in unspecified limb: Secondary | ICD-10-CM | POA: Diagnosis not present

## 2018-01-27 DIAGNOSIS — I6529 Occlusion and stenosis of unspecified carotid artery: Secondary | ICD-10-CM | POA: Diagnosis not present

## 2018-01-27 DIAGNOSIS — E559 Vitamin D deficiency, unspecified: Secondary | ICD-10-CM | POA: Diagnosis not present

## 2018-01-27 DIAGNOSIS — K219 Gastro-esophageal reflux disease without esophagitis: Secondary | ICD-10-CM | POA: Diagnosis not present

## 2018-01-27 DIAGNOSIS — M179 Osteoarthritis of knee, unspecified: Secondary | ICD-10-CM | POA: Diagnosis not present

## 2018-01-27 DIAGNOSIS — J309 Allergic rhinitis, unspecified: Secondary | ICD-10-CM | POA: Diagnosis not present

## 2018-01-29 ENCOUNTER — Other Ambulatory Visit: Payer: Self-pay | Admitting: Cardiology

## 2018-02-02 DIAGNOSIS — N200 Calculus of kidney: Secondary | ICD-10-CM | POA: Diagnosis not present

## 2018-02-02 DIAGNOSIS — R350 Frequency of micturition: Secondary | ICD-10-CM | POA: Diagnosis not present

## 2018-02-18 DIAGNOSIS — G5621 Lesion of ulnar nerve, right upper limb: Secondary | ICD-10-CM | POA: Diagnosis not present

## 2018-02-18 DIAGNOSIS — M5030 Other cervical disc degeneration, unspecified cervical region: Secondary | ICD-10-CM | POA: Diagnosis not present

## 2018-02-18 DIAGNOSIS — M542 Cervicalgia: Secondary | ICD-10-CM | POA: Diagnosis not present

## 2018-02-22 DIAGNOSIS — N200 Calculus of kidney: Secondary | ICD-10-CM | POA: Diagnosis not present

## 2018-02-23 DIAGNOSIS — Z85828 Personal history of other malignant neoplasm of skin: Secondary | ICD-10-CM | POA: Diagnosis not present

## 2018-02-23 DIAGNOSIS — C44212 Basal cell carcinoma of skin of right ear and external auricular canal: Secondary | ICD-10-CM | POA: Diagnosis not present

## 2018-03-01 DIAGNOSIS — H04123 Dry eye syndrome of bilateral lacrimal glands: Secondary | ICD-10-CM | POA: Diagnosis not present

## 2018-03-01 NOTE — Progress Notes (Signed)
HPI: FU SVT and hypertension. He has a past history of having had ablation for frequent PVCs done at the [facility] clinic in the early 1990s. He had a cardiac catheterization in 2001 showing normal coronary arteries. Repeat monitor 5/17 showed sinus with pvcs and SVT. Echo 7/17 showed normal LV function; grade 1 DD and trace AI. Seen by Dr Caryl Comes and placed on flecanide for atrial tach with improvement in symptoms; seen by Dr Rayann Heman for possible ablation but pt preferred medical therapy. ETT 10/17 showed no exercise induced VT. Nuclear study July 2019 showed ejection fraction 46% and normal perfusion.  Echocardiogram July 2019 showed normal LV function.  Since last seen,patient denies exertional chest pain, dyspnea on exertion, orthopnea, palpitations or syncope.  Current Outpatient Medications  Medication Sig Dispense Refill  . celecoxib (CELEBREX) 200 MG capsule Take 200 mg by mouth every other day.     . Cholecalciferol (VITAMIN D PO) Take 2,000 Units by mouth daily.     . flecainide (TAMBOCOR) 50 MG tablet Take 50 mg by mouth 2 (two) times daily.    . lansoprazole (PREVACID) 30 MG capsule Take 30 mg by mouth daily as needed (heart  burn).    . losartan (COZAAR) 50 MG tablet Take 1 tablet (50 mg total) by mouth daily. 90 tablet 3  . metoprolol (LOPRESSOR) 50 MG tablet Take 50 mg by mouth as needed. 25 - 50 MG  IF HEART RATE  GREATER  THAN  90     . metoprolol succinate (TOPROL-XL) 25 MG 24 hr tablet Take 25 mg by mouth every evening.    . metoprolol succinate (TOPROL-XL) 50 MG 24 hr tablet Take 50 mg by mouth every morning. Take with or immediately following a meal.    . polycarbophil (FIBERCON) 625 MG tablet Take 625 mg by mouth daily as needed for mild constipation.    . potassium citrate (UROCIT-K) 10 MEQ (1080 MG) SR tablet Take 10 mEq by mouth daily.      No current facility-administered medications for this visit.      Past Medical History:  Diagnosis Date  . Aortic sclerosis     MILD PER ECHO AS WELL AS TRACE MITRAL REGURGITATION - PER CARDIOLOGY OFFICE NOTES 06/2012  . Arthritis   . BPH (benign prostatic hyperplasia)    s/p TURP in July 2012  . Complication of anesthesia    HX OF MINOR ITCHING AFTER ANESTHESIA FOR HIP REPLACEMENT AND FOR COLON SURGERY  . EKG abnormalities    INCOMPLETE RIGHT BUNDLE BRANCH BLOCK AND CHRONIC T-WAVE ABNORMALITIES - PER CARDIOLOGY OFFICE NOTES 06/2012  . GERD (gastroesophageal reflux disease)   . History of paroxysmal supraventricular tachycardia    [name]  . Hypertension   . Palpitations    History of ventricular ectopy. He is status post radiofrequency ablation at the [facility] in the early 90's. for PVC's  . Vertigo, benign positional    [name]    Past Surgical History:  Procedure Laterality Date  . ANKLE SURGERY    . CARDIAC CATHETERIZATION  2002   NORMAL CORONARY ARTERIES  . COLON SURGERY  ? 1994  . EXTRACORPOREAL SHOCK WAVE LITHOTRIPSY Right 07/23/2017   Procedure: RIGHT EXTRACORPOREAL SHOCK WAVE LITHOTRIPSY (ESWL);  Surgeon: Alexis Frock, MD;  Location: [facility];  Service: Urology;  Laterality: Right;  . HERNIA REPAIR  1990  . JOINT REPLACEMENT  ?  2009   LEFT HIP REPLACEMENT  . KIDNEY STONE SURGERY    . right shoulder rotator cuff repair    . TONSILLECTOMY  Age 82  . TOTAL HIP ARTHROPLASTY Right 08/04/2013   Procedure: RIGHT TOTAL HIP ARTHROPLASTY ANTERIOR APPROACH;  Surgeon: Mauri Pole, MD;  Location: [facility];  Service: Orthopedics;  Laterality: Right;  . TOTAL KNEE ARTHROPLASTY Bilateral 09/13/2012   Procedure: TOTAL KNEE BILATERAL;  Surgeon: Mauri Pole, MD;  Location: [facility];  Service: Orthopedics;  Laterality: Bilateral;  . TRANSURETHRAL RESECTION OF PROSTATE    . US ECHOCARDIOGRAPHY  06-20-2009   Est EF 55-60%    Social History   Socioeconomic History  . Marital status: Married    Spouse name: Not on file  .  Number of children: Not on file  . Years of education: Not on file  . Highest education level: Not on file  Occupational History  . Not on file  Social Needs  . Financial resource strain: Not on file  . Food insecurity:    Worry: Not on file    Inability: Not on file  . Transportation needs:    Medical: Not on file    Non-medical: Not on file  Tobacco Use  . Smoking status: Never Smoker  . Smokeless tobacco: Never Used  Substance and Sexual Activity  . Alcohol use: Yes    Alcohol/week: 0.0 standard drinks    Comment: Drink Wine almost daily  . Drug use: No  . Sexual activity: Not on file  Lifestyle  . Physical activity:    Days per week: Not on file    Minutes per session: Not on file  . Stress: Not on file  Relationships  . Social connections:    Talks on phone: Not on file    Gets together: Not on file    Attends religious service: Not on file    Active member of club or organization: Not on file    Attends meetings of clubs or organizations: Not on file    Relationship status: Not on file  . Intimate partner violence:    Fear of current or ex partner: Not on file    Emotionally abused: Not on file    Physically abused: Not on file    Forced sexual activity: Not on file  Other Topics Concern  . Not on file  Social History Narrative  . Not on file    Family History  Problem Relation Age of Onset  . Heart attack Mother   . Other Neg Hx     ROS: no fevers or chills, productive cough, hemoptysis, dysphasia, odynophagia, melena, hematochezia, dysuria, hematuria, rash, seizure activity, orthopnea, PND, pedal edema, claudication. Remaining systems are negative.  Physical Exam: Well-developed well-nourished in no acute distress.  Skin is warm and dry.  HEENT is normal.  Neck is supple.  Chest is clear to auscultation with normal expansion.  Cardiovascular exam is regular rate and rhythm.  Abdominal exam nontender or distended. No masses palpated. Extremities  show no edema. neuro grossly intact   A/P  1 atrial tachycardia-patient doing well from a symptomatic standpoint.  Continue beta-blocker and flecainide.  We will consider referral for ablation in the future if he has worsening symptoms.  2 hypertension-patient's blood pressure is elevated.  Increase losartan to 100 mg daily.  Follow blood pressure and adjust regimen as needed.  Check potassium and renal function in 1 week.  3 dizziness-symptoms are chronic.  No plans for further  evaluation.  Kirk Ruths, MD

## 2018-03-09 ENCOUNTER — Encounter: Payer: Self-pay | Admitting: Cardiology

## 2018-03-09 ENCOUNTER — Ambulatory Visit (INDEPENDENT_AMBULATORY_CARE_PROVIDER_SITE_OTHER): Payer: Medicare Other | Admitting: Cardiology

## 2018-03-09 VITALS — BP 148/82 | HR 71 | Ht 72.0 in | Wt 238.2 lb

## 2018-03-09 DIAGNOSIS — I10 Essential (primary) hypertension: Secondary | ICD-10-CM

## 2018-03-09 MED ORDER — LOSARTAN POTASSIUM 100 MG PO TABS: 100.0000 mg | ORAL_TABLET | Freq: Every day | ORAL | 3 refills | Status: DC

## 2018-03-09 MED ORDER — METOPROLOL TARTRATE 50 MG PO TABS: 50.0000 mg | ORAL_TABLET | ORAL | 3 refills | Status: DC | PRN

## 2018-03-09 NOTE — Patient Instructions (Signed)
Medication Instructions:  INCREASE LOSARTAN TO 100 MG ONCE DAILY= 2 OF THE 50 MG TABLETS ONCE DAILY If you need a refill on your cardiac medications before your next appointment, please call your pharmacy.   Lab work: Your physician recommends that you return for lab work in: [place] If you have labs (blood work) drawn today and your tests are completely normal, you will receive your results only by: Marland Kitchen MyChart Message (if you have MyChart) OR . A paper copy in the mail If you have any lab test that is abnormal or we need to change your treatment, we will call you to review the results.  Follow-Up: At [facility], you and your health needs are our priority.  As part of our continuing mission to provide you with exceptional heart care, we have created designated Provider Care Teams.  These Care Teams include your primary Cardiologist (physician) and Advanced Practice Providers (APPs -  Physician Assistants and Nurse Practitioners) who all work together to provide you with the care you need, when you need it. You will need a follow up appointment in 6 months.  Please call our office 2 months in advance to schedule this appointment.  You may see Kirk Ruths, MD or one of the following Advanced Practice Providers on your designated Care Team:   Kerin Ransom, PA-C Roby Lofts, [place] . Sande Rives, PA-C

## 2018-03-10 DIAGNOSIS — Z4802 Encounter for removal of sutures: Secondary | ICD-10-CM | POA: Diagnosis not present

## 2018-04-08 DIAGNOSIS — L570 Actinic keratosis: Secondary | ICD-10-CM | POA: Diagnosis not present

## 2018-04-08 DIAGNOSIS — Z85828 Personal history of other malignant neoplasm of skin: Secondary | ICD-10-CM | POA: Diagnosis not present

## 2018-04-08 DIAGNOSIS — D045 Carcinoma in situ of skin of trunk: Secondary | ICD-10-CM | POA: Diagnosis not present

## 2018-04-12 ENCOUNTER — Encounter: Payer: Self-pay | Admitting: *Deleted

## 2018-04-20 DIAGNOSIS — I10 Essential (primary) hypertension: Secondary | ICD-10-CM | POA: Diagnosis not present

## 2018-04-21 ENCOUNTER — Encounter: Payer: Self-pay | Admitting: *Deleted

## 2018-04-21 LAB — BASIC METABOLIC PANEL
BUN/Creatinine Ratio: 16 (ref 10–24)
BUN: 21 mg/dL (ref 8–27)
CALCIUM: 9.2 mg/dL (ref 8.6–10.2)
CHLORIDE: 102 mmol/L (ref 96–106)
CO2: 20 mmol/L (ref 20–29)
Creatinine, Ser: 1.29 mg/dL — ABNORMAL HIGH (ref 0.76–1.27)
GFR calc Af Amer: 60 mL/min/{1.73_m2} (ref >59)
GFR calc non Af Amer: 52 mL/min/{1.73_m2} — ABNORMAL LOW (ref >59)
GLUCOSE: 113 mg/dL — AB (ref 65–99)
POTASSIUM: 4.4 mmol/L (ref 3.5–5.2)
Sodium: 140 mmol/L (ref 134–144)

## 2018-04-22 DIAGNOSIS — M7541 Impingement syndrome of right shoulder: Secondary | ICD-10-CM | POA: Diagnosis not present

## 2018-04-22 DIAGNOSIS — G5621 Lesion of ulnar nerve, right upper limb: Secondary | ICD-10-CM | POA: Diagnosis not present

## 2018-04-26 DIAGNOSIS — J019 Acute sinusitis, unspecified: Secondary | ICD-10-CM | POA: Diagnosis not present

## 2018-04-26 DIAGNOSIS — J029 Acute pharyngitis, unspecified: Secondary | ICD-10-CM | POA: Diagnosis not present

## 2018-04-29 DIAGNOSIS — H1033 Unspecified acute conjunctivitis, bilateral: Secondary | ICD-10-CM | POA: Diagnosis not present

## 2018-05-10 ENCOUNTER — Telehealth: Payer: Self-pay | Admitting: Cardiology

## 2018-05-10 ENCOUNTER — Other Ambulatory Visit: Payer: Self-pay | Admitting: Cardiology

## 2018-05-10 DIAGNOSIS — J00 Acute nasopharyngitis [common cold]: Secondary | ICD-10-CM | POA: Diagnosis not present

## 2018-05-10 MED ORDER — METOPROLOL SUCCINATE ER 25 MG PO TB24: 25.0000 mg | ORAL_TABLET | Freq: Every evening | ORAL | 3 refills | Status: DC

## 2018-05-10 MED ORDER — METOPROLOL SUCCINATE ER 50 MG PO TB24: 50.0000 mg | ORAL_TABLET | Freq: Every day | ORAL | 3 refills | Status: DC

## 2018-05-10 NOTE — Telephone Encounter (Signed)
Spoke with Pharmacy and informed that new prescription will be sent for Metoprolol 50 mg in the morning and Metoprolol 25 mg in the evening.  Verbalized understanding.

## 2018-05-10 NOTE — Telephone Encounter (Signed)
Patient called back and verified his DOB and the medication dosage. Rx was sent to patients pharmacy

## 2018-05-10 NOTE — Telephone Encounter (Signed)
Pharmacy is calling with questions about  metoprolol succinate (TOPROL-XL) 50 MG 24 hr tablet  They are saying the patient told them that he takes 25 mg at night. So they need a 25 mg tablet script sent since they patient was cutting the 50mg  in half and he doesn't want to do that anymore since there is a 25mg  tablet. He also wants a 90 days supply for both.

## 2018-05-10 NOTE — Telephone Encounter (Signed)
Called the patient to get clarity of how he is taking his Metoprolol Succinate for there are 2 different dosages listed. There was no answer or a voicemail box set up to leave a message will try back later.  Called patient's cell phone it went straight to voicemail. Left a message for the patient to call me back.

## 2018-05-14 DIAGNOSIS — H52203 Unspecified astigmatism, bilateral: Secondary | ICD-10-CM | POA: Diagnosis not present

## 2018-05-14 DIAGNOSIS — Z961 Presence of intraocular lens: Secondary | ICD-10-CM | POA: Diagnosis not present

## 2018-05-25 ENCOUNTER — Other Ambulatory Visit: Payer: Self-pay | Admitting: Cardiology

## 2018-05-26 DIAGNOSIS — L570 Actinic keratosis: Secondary | ICD-10-CM | POA: Diagnosis not present

## 2018-05-26 DIAGNOSIS — L905 Scar conditions and fibrosis of skin: Secondary | ICD-10-CM | POA: Diagnosis not present

## 2018-05-26 DIAGNOSIS — Z85828 Personal history of other malignant neoplasm of skin: Secondary | ICD-10-CM | POA: Diagnosis not present

## 2018-05-26 DIAGNOSIS — D044 Carcinoma in situ of skin of scalp and neck: Secondary | ICD-10-CM | POA: Diagnosis not present

## 2018-05-26 DIAGNOSIS — D0439 Carcinoma in situ of skin of other parts of face: Secondary | ICD-10-CM | POA: Diagnosis not present

## 2018-05-27 DIAGNOSIS — N200 Calculus of kidney: Secondary | ICD-10-CM | POA: Diagnosis not present

## 2018-05-27 DIAGNOSIS — N281 Cyst of kidney, acquired: Secondary | ICD-10-CM | POA: Diagnosis not present

## 2018-05-27 DIAGNOSIS — R351 Nocturia: Secondary | ICD-10-CM | POA: Diagnosis not present

## 2018-07-20 NOTE — Progress Notes (Signed)
HPI: FU SVT and hypertension. He has a past history of having had ablation for frequent PVCs done at the [facility] clinic in the early 1990s. He had a cardiac catheterization in 2001 showing normal coronary arteries. Repeat monitor 5/17 showed sinus with pvcs and SVT. Echo 7/17 showed normal LV function; grade 1 DD and trace AI. Seen by Dr Caryl Comes and placed on flecanide for atrial tach with improvement in symptoms; seen by Dr Rayann Heman for possible ablation but pt preferred medical therapy. ETT 10/17 showed no exercise induced VT. Nuclear study July 2019 showed ejection fraction 46% and normal perfusion.  Echocardiogram July 2019 showed normal LV function.  Since last seen,the patient has dyspnea with more extreme activities but not with routine activities. It is relieved with rest. It is not associated with chest pain. There is no orthopnea, PND or pedal edema. There is no syncope or palpitations. There is no exertional chest pain.   Current Outpatient Medications  Medication Sig Dispense Refill  . celecoxib (CELEBREX) 200 MG capsule Take 200 mg by mouth every other day.     . Cholecalciferol (VITAMIN D PO) Take 2,000 Units by mouth daily.     . flecainide (TAMBOCOR) 50 MG tablet TAKE ONE TABLET TWICE DAILY 180 tablet 0  . lansoprazole (PREVACID) 30 MG capsule Take 30 mg by mouth daily as needed (heart  burn).    . losartan (COZAAR) 100 MG tablet Take 1 tablet (100 mg total) by mouth daily. 90 tablet 3  . metoprolol succinate (TOPROL-XL) 25 MG 24 hr tablet Take 1 tablet (25 mg total) by mouth every evening. 90 tablet 3  . metoprolol succinate (TOPROL-XL) 50 MG 24 hr tablet Take 1 tablet (50 mg total) by mouth daily. ** DO NOT CRUSH **    (BETA BLOCKER) 90 tablet 3  . metoprolol tartrate (LOPRESSOR) 50 MG tablet Take 1 tablet (50 mg total) by mouth as needed. 25 - 50 MG  IF HEART RATE  GREATER  THAN  90 90 tablet 3  . polycarbophil (FIBERCON) 625 MG tablet Take 625 mg by mouth daily as needed for  mild constipation.    . potassium citrate (UROCIT-K) 10 MEQ (1080 MG) SR tablet Take 10 mEq by mouth daily.      No current facility-administered medications for this visit.      Past Medical History:  Diagnosis Date  . Aortic sclerosis    MILD PER ECHO AS WELL AS TRACE MITRAL REGURGITATION - PER CARDIOLOGY OFFICE NOTES 06/2012  . Arthritis   . BPH (benign prostatic hyperplasia)    s/p TURP in July 2012  . Complication of anesthesia    HX OF MINOR ITCHING AFTER ANESTHESIA FOR HIP REPLACEMENT AND FOR COLON SURGERY  . EKG abnormalities    INCOMPLETE RIGHT BUNDLE BRANCH BLOCK AND CHRONIC T-WAVE ABNORMALITIES - PER CARDIOLOGY OFFICE NOTES 06/2012  . GERD (gastroesophageal reflux disease)   . History of paroxysmal supraventricular tachycardia    [name]  . Hypertension   . Palpitations    History of ventricular ectopy. He is status post radiofrequency ablation at the [facility] in the early 90's. for PVC's  . Vertigo, benign positional    [name]    Past Surgical History:  Procedure Laterality Date  . ANKLE SURGERY    . CARDIAC CATHETERIZATION  2002   NORMAL CORONARY ARTERIES  . COLON SURGERY  ?  [place] LITHOTRIPSY Right 07/23/2017   Procedure: RIGHT EXTRACORPOREAL SHOCK WAVE LITHOTRIPSY (ESWL);  Surgeon: Alexis Frock, MD;  Location: [facility];  Service: Urology;  Laterality: Right;  . HERNIA REPAIR  1990  . JOINT REPLACEMENT  ? 2009   LEFT HIP REPLACEMENT  . KIDNEY STONE SURGERY    . right shoulder rotator cuff repair    . TONSILLECTOMY  Age 9  . TOTAL HIP ARTHROPLASTY Right 08/04/2013   Procedure: RIGHT TOTAL HIP ARTHROPLASTY ANTERIOR APPROACH;  Surgeon: Mauri Pole, MD;  Location: [facility];  Service: Orthopedics;  Laterality: Right;  . TOTAL KNEE ARTHROPLASTY Bilateral 09/13/2012   Procedure: TOTAL KNEE BILATERAL;  Surgeon: Mauri Pole, MD;  Location: [facility];   Service: Orthopedics;  Laterality: Bilateral;  . TRANSURETHRAL RESECTION OF PROSTATE    . US ECHOCARDIOGRAPHY  06-20-2009   Est EF 55-60%    Social History   Socioeconomic History  . Marital status: Married    Spouse name: Not on file  . Number of children: Not on file  . Years of education: Not on file  . Highest education level: Not on file  Occupational History  . Not on file  Social Needs  . Financial resource strain: Not on file  . Food insecurity:    Worry: Not on file    Inability: Not on file  . Transportation needs:    Medical: Not on file    Non-medical: Not on file  Tobacco Use  . Smoking status: Never Smoker  . Smokeless tobacco: Never Used  Substance and Sexual Activity  . Alcohol use: Yes    Alcohol/week: 0.0 standard drinks    Comment: Drink Wine almost daily  . Drug use: No  . Sexual activity: Not on file  Lifestyle  . Physical activity:    Days per week: Not on file    Minutes per session: Not on file  . Stress: Not on file  Relationships  . Social connections:    Talks on phone: Not on file    Gets together: Not on file    Attends religious service: Not on file    Active member of club or organization: Not on file    Attends meetings of clubs or organizations: Not on file    Relationship status: Not on file  . Intimate partner violence:    Fear of current or ex partner: Not on file    Emotionally abused: Not on file    Physically abused: Not on file    Forced sexual activity: Not on file  Other Topics Concern  . Not on file  Social History Narrative  . Not on file    Family History  Problem Relation Age of Onset  . Heart attack Mother   . Other Neg Hx     ROS: no fevers or chills, productive cough, hemoptysis, dysphasia, odynophagia, melena, hematochezia, dysuria, hematuria, rash, seizure activity, orthopnea, PND, pedal edema, claudication. Remaining systems are negative.  Physical Exam: Well-developed well-nourished in no acute  distress.  Skin is warm and dry.  HEENT is normal.  Neck is supple.  Chest is clear to auscultation with normal expansion.  Cardiovascular exam is regular rate and rhythm.  Abdominal exam nontender or distended. No masses palpated. Extremities show no edema. neuro grossly intact  ECG-sinus bradycardia at a rate of 54, nonspecific anterior T wave changes.  Personally reviewed  A/P  1 atrial tachycardia-patient symptoms are well controlled with present medical regimen.  Continue flecainide and beta-blocker.  Ablation can be considered in the future if he has more frequent episodes despite medical therapy.  2 hypertension-patient's blood pressure is controlled.  Continue present medications and follow.  3 dizziness-patient symptoms are chronic.  No plans for further cardiac evaluation at this time.  Kirk Ruths, MD

## 2018-07-26 ENCOUNTER — Encounter: Payer: Self-pay | Admitting: Cardiology

## 2018-07-26 ENCOUNTER — Ambulatory Visit (INDEPENDENT_AMBULATORY_CARE_PROVIDER_SITE_OTHER): Payer: Medicare Other | Admitting: Cardiology

## 2018-07-26 VITALS — BP 126/78 | HR 54 | Ht 72.0 in | Wt 228.0 lb

## 2018-07-26 DIAGNOSIS — I471 Supraventricular tachycardia: Secondary | ICD-10-CM

## 2018-07-26 DIAGNOSIS — R002 Palpitations: Secondary | ICD-10-CM | POA: Diagnosis not present

## 2018-07-26 DIAGNOSIS — R490 Dysphonia: Secondary | ICD-10-CM | POA: Diagnosis not present

## 2018-07-26 DIAGNOSIS — I10 Essential (primary) hypertension: Secondary | ICD-10-CM | POA: Diagnosis not present

## 2018-07-26 NOTE — Patient Instructions (Signed)
Medication Instructions:  NO CHANGE If you need a refill on your cardiac medications before your next appointment, please call your pharmacy.   Lab work: If you have labs (blood work) drawn today and your tests are completely normal, you will receive your results only by: Marland Kitchen MyChart Message (if you have MyChart) OR . A paper copy in the mail If you have any lab test that is abnormal or we need to change your treatment, we will call you to review the results.  Follow-Up: At [facility], you and your health needs are our priority.  As part of our continuing mission to provide you with exceptional heart care, we have created designated Provider Care Teams.  These Care Teams include your primary Cardiologist (physician) and Advanced Practice Providers (APPs -  Physician Assistants and Nurse Practitioners) who all work together to provide you with the care you need, when you need it. You will need a follow up appointment in 6 months.  Please call our office 2 months in advance to schedule this appointment.  You may see Kirk Ruths, MD or one of the following Advanced Practice Providers on your designated Care Team:   Kerin Ransom, PA-C Roby Lofts, [place] . Sande Rives, PA-C  CALL  IN July TO SCHEDULE APPOINTMENT IN [place]

## 2018-07-29 DIAGNOSIS — L570 Actinic keratosis: Secondary | ICD-10-CM | POA: Diagnosis not present

## 2018-07-29 DIAGNOSIS — Z85828 Personal history of other malignant neoplasm of skin: Secondary | ICD-10-CM | POA: Diagnosis not present

## 2018-07-29 DIAGNOSIS — D0462 Carcinoma in situ of skin of left upper limb, including shoulder: Secondary | ICD-10-CM | POA: Diagnosis not present

## 2018-07-29 DIAGNOSIS — L821 Other seborrheic keratosis: Secondary | ICD-10-CM | POA: Diagnosis not present

## 2018-07-29 DIAGNOSIS — D1801 Hemangioma of skin and subcutaneous tissue: Secondary | ICD-10-CM | POA: Diagnosis not present

## 2018-07-29 DIAGNOSIS — L814 Other melanin hyperpigmentation: Secondary | ICD-10-CM | POA: Diagnosis not present

## 2018-08-30 ENCOUNTER — Other Ambulatory Visit: Payer: Self-pay | Admitting: Cardiology

## 2018-10-27 DIAGNOSIS — B029 Zoster without complications: Secondary | ICD-10-CM | POA: Diagnosis not present

## 2018-10-27 DIAGNOSIS — Z85828 Personal history of other malignant neoplasm of skin: Secondary | ICD-10-CM | POA: Diagnosis not present

## 2018-10-31 ENCOUNTER — Other Ambulatory Visit: Payer: Self-pay | Admitting: Cardiology

## 2018-12-28 DIAGNOSIS — Z1159 Encounter for screening for other viral diseases: Secondary | ICD-10-CM | POA: Diagnosis not present

## 2019-02-01 ENCOUNTER — Other Ambulatory Visit: Payer: Self-pay | Admitting: Cardiology

## 2019-02-01 DIAGNOSIS — I10 Essential (primary) hypertension: Secondary | ICD-10-CM

## 2019-02-03 DIAGNOSIS — D1801 Hemangioma of skin and subcutaneous tissue: Secondary | ICD-10-CM | POA: Diagnosis not present

## 2019-02-03 DIAGNOSIS — L821 Other seborrheic keratosis: Secondary | ICD-10-CM | POA: Diagnosis not present

## 2019-02-03 DIAGNOSIS — D225 Melanocytic nevi of trunk: Secondary | ICD-10-CM | POA: Diagnosis not present

## 2019-02-03 DIAGNOSIS — D485 Neoplasm of uncertain behavior of skin: Secondary | ICD-10-CM | POA: Diagnosis not present

## 2019-02-03 DIAGNOSIS — Z85828 Personal history of other malignant neoplasm of skin: Secondary | ICD-10-CM | POA: Diagnosis not present

## 2019-02-03 DIAGNOSIS — B029 Zoster without complications: Secondary | ICD-10-CM | POA: Diagnosis not present

## 2019-02-03 DIAGNOSIS — L570 Actinic keratosis: Secondary | ICD-10-CM | POA: Diagnosis not present

## 2019-02-03 DIAGNOSIS — L814 Other melanin hyperpigmentation: Secondary | ICD-10-CM | POA: Diagnosis not present

## 2019-02-21 DIAGNOSIS — Z23 Encounter for immunization: Secondary | ICD-10-CM | POA: Diagnosis not present

## 2019-02-28 ENCOUNTER — Ambulatory Visit: Payer: Medicare Other | Admitting: Cardiology

## 2019-03-09 ENCOUNTER — Ambulatory Visit: Payer: Medicare Other | Admitting: Physician Assistant

## 2019-03-10 ENCOUNTER — Ambulatory Visit: Payer: Medicare Other | Admitting: Physician Assistant

## 2019-03-17 ENCOUNTER — Telehealth: Payer: Self-pay

## 2019-03-17 NOTE — Telephone Encounter (Signed)
Left message for him to call the office about his November 2nd appointment.

## 2019-03-18 ENCOUNTER — Telehealth: Payer: Self-pay | Admitting: Cardiology

## 2019-03-18 NOTE — Telephone Encounter (Signed)
Follow up:     Patient returning your call back. Please call patient back concering appt.

## 2019-03-20 NOTE — Progress Notes (Signed)
Cardiology Clinic Note   Patient Name: [name] [name] Date of Encounter: 03/21/2019  Primary Care Provider:  Josetta Huddle, MD Primary Cardiologist:  Kirk Ruths, MD  Patient Profile    [name] [name].  83 year old male presents today for follow-up of his hypertension, paroxysmal supraventricular tachycardia, and lightheadedness.  Past Medical History    Past Medical History:  Diagnosis Date  . Aortic sclerosis    MILD PER ECHO AS WELL AS TRACE MITRAL REGURGITATION - PER CARDIOLOGY OFFICE NOTES 06/2012  . Arthritis   . BPH (benign prostatic hyperplasia)    s/p TURP in July 2012  . Complication of anesthesia    HX OF MINOR ITCHING AFTER ANESTHESIA FOR HIP REPLACEMENT AND FOR COLON SURGERY  . EKG abnormalities    INCOMPLETE RIGHT BUNDLE BRANCH BLOCK AND CHRONIC T-WAVE ABNORMALITIES - PER CARDIOLOGY OFFICE NOTES 06/2012  . GERD (gastroesophageal reflux disease)   . History of paroxysmal supraventricular tachycardia    [name]  . Hypertension   . Palpitations    History of ventricular ectopy. He is status post radiofrequency ablation at the [facility] in the early 90's. for PVC's  . Vertigo, benign positional    [name]   Past Surgical History:  Procedure Laterality Date  . ANKLE SURGERY    . CARDIAC CATHETERIZATION  2002   NORMAL CORONARY ARTERIES  . COLON SURGERY  ? 1994  . EXTRACORPOREAL SHOCK WAVE LITHOTRIPSY Right 07/23/2017   Procedure: RIGHT EXTRACORPOREAL SHOCK WAVE LITHOTRIPSY (ESWL);  Surgeon: Alexis Frock, MD;  [facility];  Service: Urology;  Laterality: Right;  . HERNIA REPAIR  1990  . JOINT REPLACEMENT  ? 2009   LEFT HIP REPLACEMENT  . KIDNEY STONE SURGERY    . right shoulder rotator cuff repair    . TONSILLECTOMY  Age 47  . TOTAL HIP ARTHROPLASTY Right 08/04/2013   Procedure: RIGHT TOTAL HIP ARTHROPLASTY ANTERIOR APPROACH;  Surgeon: Mauri Pole, MD;  [facility]:  [facility];  Service: Orthopedics;  Laterality: Right;  . TOTAL KNEE ARTHROPLASTY Bilateral 09/13/2012   Procedure: TOTAL KNEE BILATERAL;  Surgeon: Mauri Pole, MD;  [facility];  Service: Orthopedics;  Laterality: Bilateral;  . TRANSURETHRAL RESECTION OF PROSTATE    . US ECHOCARDIOGRAPHY  06-20-2009   Est EF 55-60%    Allergies  Allergies  Allergen Reactions  . Hydrocodone-Acetaminophen Other (See Comments)    Confusion  . Oxycodone Other (See Comments)    Angry and confused  . Ramipril     Cough    History of Present Illness  [name] [name] has a past medical history of SVT, hypertension, syncope and collapse, GERD, vertigo, OA, CKD stage III, dehydration, palpitations, and complications with anesthesia.  He underwent an ablation procedure in the early 1940s frequent PVCs in the [facility] clinic.  He had a cardiac catheterization in 2001 that showed normal coronary arteries.  A cardiac monitor from 5/17 showed sinus rhythm with PVCs NSVT.  An echocardiogram on 11/2015 showed normal LV function, grade 1 diastolic dysfunction and trace aortic insufficiency.  He was seen by Dr. Caryl Comes and placed on flecainide for atrial tachycardia which improved his symptoms.  He was also seen by Dr. Rayann Heman for possible ablation but decided to stay with medical therapy.  An ETT on 1017 showed no exercise-induced VT.  His nuclear stress test on 12/05/2017 showed ejection fraction of 46% and normal perfusion.  An echocardiogram  on 11/2017 showed normal LVEF.  He was last seen by [name] on 07/26/2018.  During that time he was doing well but noticed dyspnea with more intensive physical activity but not with routine activities.  He denied chest pain, orthopnea, PND, and lower extremity edema.  He also denied syncopal events and palpitations.  He presents the clinic today and states he has noticed an increase in the amount of palpitations he has during the week.  He states he routinely wakes up to use the restroom and  notices his heart rate is in the low 100s.  He has been taking metoprolol tartrate as needed 12.5 mg 3-4 times per week.  He states he notices that when he is with friends and consumes 2 alcoholic drinks he has an increase in his palpitations.  He states he continues to play golf 2 times per week but has limited with other exercise due to his knees and hips.  He states he routinely walks at the beach which she enjoys.  He continues to follow low-sodium diet.  He denies chest pain, shortness of breath, lower extremity edema, fatigue,  melena, hematuria, hemoptysis, diaphoresis, weakness, presyncope, syncope, orthopnea, and PND.   Home Medications    Prior to Admission medications   Medication Sig Start Date End Date Taking? Authorizing Provider  celecoxib (CELEBREX) 200 MG capsule Take 200 mg by mouth every other day.     [provider]  Cholecalciferol (VITAMIN D PO) Take 2,000 Units by mouth daily.     [provider]  flecainide (TAMBOCOR) 50 MG tablet TAKE ONE TABLET TWICE DAILY 11/01/18   Lelon Perla, MD  lansoprazole (PREVACID) 30 MG capsule Take 30 mg by mouth daily as needed (heart  burn).    [provider]  losartan (COZAAR) 100 MG tablet TAKE ONE TABLET DAILY 02/01/19   Lelon Perla, MD  metoprolol succinate (TOPROL-XL) 25 MG 24 hr tablet Take 1 tablet (25 mg total) by mouth every evening. 05/10/18   Lelon Perla, MD  metoprolol succinate (TOPROL-XL) 50 MG 24 hr tablet Take 1 tablet (50 mg total) by mouth daily. ** DO NOT CRUSH **    (BETA BLOCKER) 05/10/18   Crenshaw, Denice Bors, MD  metoprolol tartrate (LOPRESSOR) 50 MG tablet Take 1 tablet (50 mg total) by mouth as needed. 25 - 50 MG  IF HEART RATE  GREATER  THAN  90 03/09/18   Lelon Perla, MD  polycarbophil (FIBERCON) 625 MG tablet Take 625 mg by mouth daily as needed for mild constipation.    [provider]  potassium citrate (UROCIT-K) 10 MEQ (1080 MG) SR tablet Take 10 mEq by  mouth daily.     [provider]    Family History    Family History  Problem Relation Age of Onset  . Heart attack Mother   . Other Neg Hx    He indicated that his mother is deceased. He indicated that his father is deceased. He indicated that the status of his neg hx is unknown.  Social History    Social History   Socioeconomic History  . Marital status: Married    Spouse name: Not on file  . Number of children: Not on file  . Years of education: Not on file  . Highest education level: Not on file  Occupational History  . Not on file  Social Needs  . Financial resource strain: Not on file  . Food insecurity    Worry:  Not on file    Inability: Not on file  . Transportation needs    Medical: Not on file    Non-medical: Not on file  Tobacco Use  . Smoking status: Never Smoker  . Smokeless tobacco: Never Used  Substance and Sexual Activity  . Alcohol use: Yes    Alcohol/week: 0.0 standard drinks    Comment: Drink Wine almost daily  . Drug use: No  . Sexual activity: Not on file  Lifestyle  . Physical activity    Days per week: Not on file    Minutes per session: Not on file  . Stress: Not on file  Relationships  . Social Herbalist on phone: Not on file    Gets together: Not on file    Attends religious service: Not on file    Active member of club or organization: Not on file    Attends meetings of clubs or organizations: Not on file    Relationship status: Not on file  . Intimate partner violence    Fear of current or ex partner: Not on file    Emotionally abused: Not on file    Physically abused: Not on file    Forced sexual activity: Not on file  Other Topics Concern  . Not on file  Social History Narrative  . Not on file     Review of Systems    General:  No chills, fever, night sweats or weight changes.  Cardiovascular:  No chest pain, dyspnea on exertion, edema, orthopnea, palpitations, paroxysmal nocturnal dyspnea.  Dermatological: No rash, lesions/masses Respiratory: No cough, dyspnea Urologic: No hematuria, dysuria Abdominal:   No nausea, vomiting, diarrhea, bright red blood per rectum, melena, or hematemesis Neurologic:  No visual changes, wkns, changes in mental status. All other systems reviewed and are otherwise negative except as noted above.  Physical Exam    VS:  BP 128/82   Pulse (!) 58   Ht 6' (1.829 m)   Wt 234 lb (106.1 kg)   BMI 31.74 kg/m  , BMI Body mass index is 31.74 kg/m. GEN: Well nourished, well developed, in no acute distress. HEENT: normal. Neck: Supple, no JVD, carotid bruits, or masses. Cardiac: RRR, no murmurs, rubs, or gallops. No clubbing, cyanosis, edema.  Radials/DP/PT 2+ and equal bilaterally.  Respiratory:  Respirations regular and unlabored, clear to auscultation bilaterally. GI: Soft, nontender, nondistended, BS + x 4. MS: no deformity or atrophy. Skin: warm and dry, no rash. Neuro:  Strength and sensation are intact. Psych: Normal affect.  Accessory Clinical Findings    ECG personally reviewed by me today-normal sinus rhythm left axis deviation 64 bpm- No acute changes  EKG 07/26/2018 Sinus bradycardia left axis deviation 54 bpm  Echocardiogram 12/03/2017 - Left ventricle: The cavity size was normal. Wall thickness was   increased in a pattern of mild LVH. Systolic function was normal.   The estimated ejection fraction was in the range of 55% to 60%.   Wall motion was normal; there were no regional wall motion   abnormalities.  Myocardial perfusion study 11/27/2017  Nuclear stress EF: 46%.  The left ventricular ejection fraction is mildly decreased (45-54%).  There was no ST segment deviation noted during stress.  No T wave inversion was noted during stress.  This is a low risk study.  The study is normal.   Low risk stress nuclear study with normal perfusion and mildly decreased left ventricular global systolic function. Compared to the  previous  study, left ventricular function has decreased. Consider comparison to echocardiography.    Assessment & Plan   1.  Atrial tachycardia-EKG today shows normal sinus rhythm 64 bpm.  He has noticed an increase in heart palpitations and is taking metoprolol tartrate 12.5 mg 3-4 times per week for rate control. Increase metoprolol succinate to 50 mg in the a.m. and 37.5 mg in the evening. Continue flecainide 50 mg tablet twice daily Avoid triggers-educated about avoiding stimulants (caffeine, chocolate, nicotine). Limit alcohol to 1 drink during social events  Essential hypertension-BP today 128/82 Continue losartan 100 mg tablet daily Increase metoprolol succinate to 50 mg in the a.m. and 37.5 mg in the evening.  Lightheadedness/dizziness-last episode of dizziness was several months ago Discussed support stockings below the knee Maintain adequate p.o. hydration  Disposition: Follow-up with [name] in 6 months.   Jossie Ng. [facility] [address] Office [phone] Fax [phone]

## 2019-03-21 ENCOUNTER — Other Ambulatory Visit: Payer: Self-pay

## 2019-03-21 ENCOUNTER — Encounter: Payer: Self-pay | Admitting: General Practice

## 2019-03-21 ENCOUNTER — Ambulatory Visit (INDEPENDENT_AMBULATORY_CARE_PROVIDER_SITE_OTHER): Payer: Medicare Other | Admitting: General Practice

## 2019-03-21 VITALS — BP 128/82 | HR 58 | Ht 72.0 in | Wt 234.0 lb

## 2019-03-21 DIAGNOSIS — I10 Essential (primary) hypertension: Secondary | ICD-10-CM | POA: Diagnosis not present

## 2019-03-21 DIAGNOSIS — I471 Supraventricular tachycardia: Secondary | ICD-10-CM | POA: Diagnosis not present

## 2019-03-21 DIAGNOSIS — R42 Dizziness and giddiness: Secondary | ICD-10-CM

## 2019-03-21 MED ORDER — METOPROLOL SUCCINATE ER 25 MG PO TB24: 25.0000 mg | ORAL_TABLET | Freq: Every evening | ORAL | 3 refills | Status: DC

## 2019-03-21 MED ORDER — METOPROLOL SUCCINATE ER 50 MG PO TB24: 50.0000 mg | ORAL_TABLET | Freq: Every day | ORAL | 3 refills | Status: DC

## 2019-03-21 MED ORDER — METOPROLOL SUCCINATE ER 25 MG PO TB24: 37.5000 mg | ORAL_TABLET | Freq: Every evening | ORAL | 6 refills | Status: DC

## 2019-03-21 NOTE — Patient Instructions (Signed)
INCREASE PHYSICAL ACTIVITY 30 MIN 5 DAYS A WEEK  LIMIT ALCOHOL INTAKE TO ONE DRINK A DAY  Follow-Up: IN 6 months Please call our office 2 months in advance, FEB 2021 to schedule this MAY 2021 appointment. In Person You may see Kirk Ruths, MD [place], FNP or one of the following Advanced Practice Providers on your designated Care Team:  Almyra Deforest, PA-C  Fabian Sharp, PA-C or Roby Lofts, [place].    Medication Instructions:  INCREASE NIGHT-TIME METOPROLOL 37.5MG  (1-1/2 TAB) The current medical regimen is effective;  continue present plan and medications as directed. Please refer to the Current Medication list given to you today. If you need a refill on your cardiac medications before your next appointment, please call your pharmacy.  At [facility], you and your health needs are our priority.  As part of our continuing mission to provide you with exceptional heart care, we have created designated Provider Care Teams.  These Care Teams include your primary Cardiologist (physician) and Advanced Practice Providers (APPs -  Physician Assistants and Nurse Practitioners) who all work together to provide you with the care you need, when you need it.  Thank you for choosing [facility] at [facility]!!

## 2019-04-01 DIAGNOSIS — C44329 Squamous cell carcinoma of skin of other parts of face: Secondary | ICD-10-CM | POA: Diagnosis not present

## 2019-04-01 DIAGNOSIS — Z85828 Personal history of other malignant neoplasm of skin: Secondary | ICD-10-CM | POA: Diagnosis not present

## 2019-04-01 DIAGNOSIS — L308 Other specified dermatitis: Secondary | ICD-10-CM | POA: Diagnosis not present

## 2019-04-13 DIAGNOSIS — H52203 Unspecified astigmatism, bilateral: Secondary | ICD-10-CM | POA: Diagnosis not present

## 2019-04-13 DIAGNOSIS — Z961 Presence of intraocular lens: Secondary | ICD-10-CM | POA: Diagnosis not present

## 2019-05-04 ENCOUNTER — Other Ambulatory Visit: Payer: Self-pay | Admitting: Cardiology

## 2019-05-04 DIAGNOSIS — I10 Essential (primary) hypertension: Secondary | ICD-10-CM

## 2019-05-05 DIAGNOSIS — E559 Vitamin D deficiency, unspecified: Secondary | ICD-10-CM | POA: Diagnosis not present

## 2019-05-05 DIAGNOSIS — N200 Calculus of kidney: Secondary | ICD-10-CM | POA: Diagnosis not present

## 2019-05-05 DIAGNOSIS — Z86010 Personal history of colonic polyps: Secondary | ICD-10-CM | POA: Diagnosis not present

## 2019-05-05 DIAGNOSIS — I10 Essential (primary) hypertension: Secondary | ICD-10-CM | POA: Diagnosis not present

## 2019-05-05 DIAGNOSIS — K219 Gastro-esophageal reflux disease without esophagitis: Secondary | ICD-10-CM | POA: Diagnosis not present

## 2019-05-05 DIAGNOSIS — H9313 Tinnitus, bilateral: Secondary | ICD-10-CM | POA: Diagnosis not present

## 2019-05-05 DIAGNOSIS — I6529 Occlusion and stenosis of unspecified carotid artery: Secondary | ICD-10-CM | POA: Diagnosis not present

## 2019-05-05 DIAGNOSIS — J309 Allergic rhinitis, unspecified: Secondary | ICD-10-CM | POA: Diagnosis not present

## 2019-05-05 DIAGNOSIS — Z0001 Encounter for general adult medical examination with abnormal findings: Secondary | ICD-10-CM | POA: Diagnosis not present

## 2019-05-05 DIAGNOSIS — I471 Supraventricular tachycardia: Secondary | ICD-10-CM | POA: Diagnosis not present

## 2019-05-05 DIAGNOSIS — R490 Dysphonia: Secondary | ICD-10-CM | POA: Diagnosis not present

## 2019-06-17 ENCOUNTER — Ambulatory Visit: Payer: Medicare Other

## 2019-06-27 ENCOUNTER — Ambulatory Visit: Payer: Medicare Other | Attending: Internal Medicine

## 2019-06-27 DIAGNOSIS — Z23 Encounter for immunization: Secondary | ICD-10-CM | POA: Insufficient documentation

## 2019-06-27 NOTE — Progress Notes (Signed)
   Covid-19 Vaccination Clinic  Name:  [name] [name]    MRN: [id number] DOB: 09-Sep-1935  06/27/2019  Mr.  was observed post Covid-19 immunization for 15 minutes without incidence. He was provided with Vaccine Information Sheet and instruction to access the V-Safe system.   [name] was instructed to call 911 with any severe reactions post vaccine: Marland Kitchen Difficulty breathing  . Swelling of your face and throat  . A fast heartbeat  . A bad rash all over your body  . Dizziness and weakness    Immunizations Administered    Name Date Dose VIS Date Route   Pfizer COVID-19 Vaccine 06/27/2019  5:10 PM 0.3 mL 04/29/2019 Intramuscular   Manufacturer: [place]   Lot: [id number]   [place]: [id number]

## 2019-06-28 ENCOUNTER — Ambulatory Visit: Payer: Medicare Other

## 2019-07-15 ENCOUNTER — Other Ambulatory Visit: Payer: Self-pay | Admitting: Cardiology

## 2019-07-15 DIAGNOSIS — I10 Essential (primary) hypertension: Secondary | ICD-10-CM

## 2019-07-18 DIAGNOSIS — B0229 Other postherpetic nervous system involvement: Secondary | ICD-10-CM | POA: Diagnosis not present

## 2019-07-18 DIAGNOSIS — L570 Actinic keratosis: Secondary | ICD-10-CM | POA: Diagnosis not present

## 2019-07-18 DIAGNOSIS — Z85828 Personal history of other malignant neoplasm of skin: Secondary | ICD-10-CM | POA: Diagnosis not present

## 2019-07-18 DIAGNOSIS — D485 Neoplasm of uncertain behavior of skin: Secondary | ICD-10-CM | POA: Diagnosis not present

## 2019-07-22 ENCOUNTER — Ambulatory Visit: Payer: Medicare Other | Attending: Internal Medicine

## 2019-07-22 DIAGNOSIS — Z23 Encounter for immunization: Secondary | ICD-10-CM | POA: Insufficient documentation

## 2019-07-22 NOTE — Progress Notes (Signed)
   Covid-19 Vaccination Clinic  Name:  [name] [name]    MRN: [id number] DOB: Nov 14, 1935  07/22/2019  Mr.  was observed post Covid-19 immunization for 15 minutes without incident. He was provided with Vaccine Information Sheet and instruction to access the V-Safe system.   [name] [name] was instructed to call 911 with any severe reactions post vaccine: Marland Kitchen Difficulty breathing  . Swelling of face and throat  . A fast heartbeat  . A bad rash all over body  . Dizziness and weakness   Immunizations Administered    Name Date Dose VIS Date Route   Pfizer COVID-19 Vaccine 07/22/2019  3:32 PM 0.3 mL 04/29/2019 Intramuscular   Manufacturer: [place]   Lot: [id number]   [place]: [id number]

## 2019-08-02 DIAGNOSIS — D1801 Hemangioma of skin and subcutaneous tissue: Secondary | ICD-10-CM | POA: Diagnosis not present

## 2019-08-02 DIAGNOSIS — L918 Other hypertrophic disorders of the skin: Secondary | ICD-10-CM | POA: Diagnosis not present

## 2019-08-02 DIAGNOSIS — L570 Actinic keratosis: Secondary | ICD-10-CM | POA: Diagnosis not present

## 2019-08-02 DIAGNOSIS — L309 Dermatitis, unspecified: Secondary | ICD-10-CM | POA: Diagnosis not present

## 2019-08-02 DIAGNOSIS — L821 Other seborrheic keratosis: Secondary | ICD-10-CM | POA: Diagnosis not present

## 2019-08-02 DIAGNOSIS — D225 Melanocytic nevi of trunk: Secondary | ICD-10-CM | POA: Diagnosis not present

## 2019-08-02 DIAGNOSIS — B0229 Other postherpetic nervous system involvement: Secondary | ICD-10-CM | POA: Diagnosis not present

## 2019-08-02 DIAGNOSIS — Z85828 Personal history of other malignant neoplasm of skin: Secondary | ICD-10-CM | POA: Diagnosis not present

## 2019-08-02 DIAGNOSIS — L814 Other melanin hyperpigmentation: Secondary | ICD-10-CM | POA: Diagnosis not present

## 2019-08-16 ENCOUNTER — Other Ambulatory Visit: Payer: Self-pay | Admitting: Cardiology

## 2020-01-23 DIAGNOSIS — N132 Hydronephrosis with renal and ureteral calculous obstruction: Secondary | ICD-10-CM | POA: Diagnosis not present

## 2020-01-23 DIAGNOSIS — N2889 Other specified disorders of kidney and ureter: Secondary | ICD-10-CM | POA: Diagnosis not present

## 2020-01-23 DIAGNOSIS — K219 Gastro-esophageal reflux disease without esophagitis: Secondary | ICD-10-CM | POA: Diagnosis not present

## 2020-01-23 DIAGNOSIS — Z79899 Other long term (current) drug therapy: Secondary | ICD-10-CM | POA: Diagnosis not present

## 2020-01-23 DIAGNOSIS — Z888 Allergy status to other drugs, medicaments and biological substances status: Secondary | ICD-10-CM | POA: Diagnosis not present

## 2020-01-23 DIAGNOSIS — N201 Calculus of ureter: Secondary | ICD-10-CM | POA: Diagnosis not present

## 2020-01-23 DIAGNOSIS — N289 Disorder of kidney and ureter, unspecified: Secondary | ICD-10-CM | POA: Diagnosis not present

## 2020-01-23 DIAGNOSIS — I10 Essential (primary) hypertension: Secondary | ICD-10-CM | POA: Diagnosis not present

## 2020-01-30 ENCOUNTER — Telehealth: Payer: Self-pay | Admitting: Cardiology

## 2020-01-30 NOTE — Telephone Encounter (Signed)
Spoke to pt who report he was seen in the ER on 9/6 and was instructed to follow up with cardiologist due to elevated BP. He report systolic was 038. He also report on Friday 9/10, he felt extremely fatigued/faint and BP was 70/46. Pt voiced since then he has felt well but BP today was 138/61.   Nurse offered an appointment with APP but pt refused and voiced he only want to see [name]. Appointment scheduled for  10/4 but pt would like a message sent to MD and nurse to see if he could move up appointment date.

## 2020-01-30 NOTE — Telephone Encounter (Signed)
Patient states he is feeling "awful and extremely fatigued". He requested to schedule appointment with [name] ASAP. I informed patient that [name] does not have availability for the next few months and I offered to schedule sooner appointment with an APP. Patient denied and insisted that he must see [name]. Made patient aware I would forward his request to Dr. Jacalyn Lefevre nurse. Please advise.

## 2020-01-31 NOTE — Telephone Encounter (Signed)
I am not in office; can keep that appointment; follow BP at home [facility]

## 2020-02-02 ENCOUNTER — Telehealth: Payer: Self-pay | Admitting: Cardiology

## 2020-02-02 DIAGNOSIS — L821 Other seborrheic keratosis: Secondary | ICD-10-CM | POA: Diagnosis not present

## 2020-02-02 DIAGNOSIS — D0462 Carcinoma in situ of skin of left upper limb, including shoulder: Secondary | ICD-10-CM | POA: Diagnosis not present

## 2020-02-02 DIAGNOSIS — C44722 Squamous cell carcinoma of skin of right lower limb, including hip: Secondary | ICD-10-CM | POA: Diagnosis not present

## 2020-02-02 DIAGNOSIS — Z85828 Personal history of other malignant neoplasm of skin: Secondary | ICD-10-CM | POA: Diagnosis not present

## 2020-02-02 DIAGNOSIS — L814 Other melanin hyperpigmentation: Secondary | ICD-10-CM | POA: Diagnosis not present

## 2020-02-02 DIAGNOSIS — L570 Actinic keratosis: Secondary | ICD-10-CM | POA: Diagnosis not present

## 2020-02-02 DIAGNOSIS — D1801 Hemangioma of skin and subcutaneous tissue: Secondary | ICD-10-CM | POA: Diagnosis not present

## 2020-02-02 NOTE — Telephone Encounter (Signed)
Called [name] to inform him that [name] would like for home to monitor, log his BP and that he is ok with him keeping appointment with him. Patient stated that he called this morning, spoke to someone and is scheduled for next week to see the PA, Almyra Deforest due to him being out of town at the time of his 02/20/20 appointment. Also asked patient to monitor and log BP at home and to being in his readings when he comes to his 02/08/20 appointment. [name] verbalized understanding and thanked for the call.

## 2020-02-02 NOTE — Telephone Encounter (Signed)
Pt c/o BP issue: STAT if pt c/o blurred vision, one-sided weakness or slurred speech  1. What are your last 5 BP readings?  01-27-20- 76/42   92/48 135/55   01-30-20 138/52 152/75 138/61 132/69  2. Are you having any other symptoms (ex. Dizziness, headache, blurred vision, passed out)? Some dixxines, had an episode almost passed out  3. What is your BP issue? Blood pressure keeps fluctuating- please call to evaluate-pt wanted to be seen- I made an appt with Isaac Laud who requested to see on 02-08-20

## 2020-02-02 NOTE — Telephone Encounter (Signed)
Spoke to patient he stated he was seen at [facility] ED in [facility] on 9/6 was diagnosed with a kidney stone.Flomax was prescribed.Stated his B/P dropped after he started taking 76/42,92/48.He felt faint.Stated he stopped taking and he feels better B/P 135/55,152/75,138/61.Today 139/71.Stated he has appointment to see Dr.Dalstedt on Monday 02/06/20.Stated he was given appointment with Almyra Deforest PA 9/22.Stated since his B/P is better he rather see Dr.Crenshaw.Appointment with Almyra Deforest PA cancelled.Apointment scheduled with Dr.Crenshaw 10/15 at 10:20 am.Advised to cal back if needs to be seen sooner.

## 2020-02-06 DIAGNOSIS — N401 Enlarged prostate with lower urinary tract symptoms: Secondary | ICD-10-CM | POA: Diagnosis not present

## 2020-02-06 DIAGNOSIS — R350 Frequency of micturition: Secondary | ICD-10-CM | POA: Diagnosis not present

## 2020-02-06 DIAGNOSIS — N200 Calculus of kidney: Secondary | ICD-10-CM | POA: Diagnosis not present

## 2020-02-06 DIAGNOSIS — D49511 Neoplasm of unspecified behavior of right kidney: Secondary | ICD-10-CM | POA: Diagnosis not present

## 2020-02-08 ENCOUNTER — Ambulatory Visit: Payer: Medicare Other | Admitting: Physician Assistant

## 2020-02-13 DIAGNOSIS — D49511 Neoplasm of unspecified behavior of right kidney: Secondary | ICD-10-CM | POA: Diagnosis not present

## 2020-02-16 DIAGNOSIS — N200 Calculus of kidney: Secondary | ICD-10-CM | POA: Diagnosis not present

## 2020-02-20 ENCOUNTER — Ambulatory Visit: Payer: Medicare Other | Admitting: Cardiology

## 2020-02-22 NOTE — Progress Notes (Signed)
HPI: FU SVT and hypertension. He has a past history of having had ablation for frequent PVCs done at the [facility] clinic in the early 1990s. He had a cardiac catheterization in 2001 showing normal coronary arteries. Repeat monitor 5/17 showed sinus with pvcs and SVT. Echo 7/17 showed normal LV function; grade 1 DD and trace AI. Seen by Dr Caryl Comes and placed on flecanide for atrial tach with improvement in symptoms; seen by Dr Rayann Heman for possible ablation but pt preferred medical therapy. ETT 10/17 showed no exercise induced VT.Nuclear study July 2019 showed ejection fraction 46% and normal perfusion. Echocardiogram July 2019 showed normal LV function. Since last seen,he denies dyspnea on exertion, exertional chest pain or syncope.  He has had approximately 6 bouts of SVT which causes palpitations and fatigue.  Typically last 10 to 15 minutes and he has taken additional beta-blockade for this previously with termination.  Current Outpatient Medications  Medication Sig Dispense Refill   celecoxib (CELEBREX) 200 MG capsule Take 200 mg by mouth every other day.      Cholecalciferol (VITAMIN D PO) Take 2,000 Units by mouth daily.      flecainide (TAMBOCOR) 50 MG tablet TAKE ONE TABLET TWICE DAILY 180 tablet 2   losartan (COZAAR) 100 MG tablet TAKE ONE TABLET DAILY 90 tablet 2   metoprolol succinate (TOPROL-XL) 25 MG 24 hr tablet Take 1.5 tablets (37.5 mg total) by mouth every evening. 135 tablet 6   metoprolol succinate (TOPROL-XL) 50 MG 24 hr tablet Take 1 tablet (50 mg total) by mouth daily. ** DO NOT CRUSH **    (BETA BLOCKER) 90 tablet 3   metoprolol tartrate (LOPRESSOR) 50 MG tablet TAKE AS NEEDED -TAKE 1/2 TO 1 TABLET IF HEART RATE GREATER THAN 90 90 tablet 1   polycarbophil (FIBERCON) 625 MG tablet Take 625 mg by mouth daily as needed for mild constipation.     potassium citrate (UROCIT-K) 10 MEQ (1080 MG) SR tablet Take 10 mEq by mouth daily.      No current  facility-administered medications for this visit.     Past Medical History:  Diagnosis Date   Aortic sclerosis    MILD PER ECHO AS WELL AS TRACE MITRAL REGURGITATION - PER CARDIOLOGY OFFICE NOTES 06/2012   Arthritis    BPH (benign prostatic hyperplasia)    s/p TURP in July 9024   Complication of anesthesia    HX OF MINOR ITCHING AFTER ANESTHESIA FOR HIP REPLACEMENT AND FOR COLON SURGERY   EKG abnormalities    INCOMPLETE RIGHT BUNDLE BRANCH BLOCK AND CHRONIC T-WAVE ABNORMALITIES - PER CARDIOLOGY OFFICE NOTES 06/2012   GERD (gastroesophageal reflux disease)    History of paroxysmal supraventricular tachycardia    [name]   Hypertension    Palpitations    History of ventricular ectopy. He is status post radiofrequency ablation at the [facility] in the early 90's. for PVC's   Vertigo, benign positional    [name]    Past Surgical History:  Procedure Laterality Date   ANKLE SURGERY     CARDIAC CATHETERIZATION  2002   NORMAL CORONARY ARTERIES   COLON SURGERY  ? 1994   EXTRACORPOREAL SHOCK WAVE LITHOTRIPSY Right 07/23/2017   Procedure: RIGHT EXTRACORPOREAL SHOCK WAVE LITHOTRIPSY (ESWL);  Surgeon: Alexis Frock, MD;  Location: [facility];  Service: Urology;  Laterality: Right;   HERNIA REPAIR  1990   JOINT REPLACEMENT  ?  2009   LEFT HIP REPLACEMENT   KIDNEY STONE SURGERY     right shoulder rotator cuff repair     TONSILLECTOMY  Age 84   TOTAL HIP ARTHROPLASTY Right 08/04/2013   Procedure: RIGHT TOTAL HIP ARTHROPLASTY ANTERIOR APPROACH;  Surgeon: Mauri Pole, MD;  Location: [facility];  Service: Orthopedics;  Laterality: Right;   TOTAL KNEE ARTHROPLASTY Bilateral 09/13/2012   Procedure: TOTAL KNEE BILATERAL;  Surgeon: Mauri Pole, MD;  Location: [facility];  Service: Orthopedics;  Laterality: Bilateral;   TRANSURETHRAL RESECTION OF PROSTATE     US ECHOCARDIOGRAPHY  06-20-2009   Est  EF 55-60%    Social History   Socioeconomic History   Marital status: Married    Spouse name: Not on file   Number of children: Not on file   Years of education: Not on file   Highest education level: Not on file  Occupational History   Not on file  Tobacco Use   Smoking status: Never Smoker   Smokeless tobacco: Never Used  Vaping Use   Vaping Use: Never used  Substance and Sexual Activity   Alcohol use: Yes    Alcohol/week: 0.0 standard drinks    Comment: Drink Wine almost daily   Drug use: No   Sexual activity: Not on file  Other Topics Concern   Not on file  Social History Narrative   Not on file   Social Determinants of Health   Financial Resource Strain:    Difficulty of Paying Living Expenses: Not on file  Food Insecurity:    Worried About [address] in the Last Year: Not on file   Ran Out of Food in the Last Year: Not on file  Transportation Needs:    Lack of Transportation (Medical): Not on file   Lack of Transportation (Non-Medical): Not on file  Physical Activity:    Days of Exercise per Week: Not on file   Minutes of Exercise per Session: Not on file  Stress:    Feeling of Stress : Not on file  Social Connections:    Frequency of Communication with Friends and Family: Not on file   Frequency of Social Gatherings with Friends and Family: Not on file   Attends Religious Services: Not on file   Active Member of [employer] or Organizations: Not on file   Attends Archivist Meetings: Not on file   Marital Status: Not on file  Intimate Partner Violence:    Fear of Current or Ex-Partner: Not on file   Emotionally Abused: Not on file   Physically Abused: Not on file   Sexually Abused: Not on file    Family History  Problem Relation Age of Onset   Heart attack Mother    Other Neg Hx     ROS: no fevers or chills, productive cough, hemoptysis, dysphasia, odynophagia, melena, hematochezia, dysuria,  hematuria, rash, seizure activity, orthopnea, PND, pedal edema, claudication. Remaining systems are negative.  Physical Exam: Well-developed well-nourished in no acute distress.  Skin is warm and dry.  HEENT is normal.  Neck is supple.  Chest is clear to auscultation with normal expansion.  Cardiovascular exam is regular rate and rhythm.  Abdominal exam nontender or distended. No masses palpated. Extremities show no edema. neuro grossly intact  ECG-sinus bradycardia at a rate of 59, first-degree AV block, anterior T wave inversion.  Personally reviewed  A/P  1 history of atrial tachycardia-he has had about 6 episodes since last office visit.  Typically short-lived.  We discussed options today.  I will increase flecainide to 100 mg twice daily.  In 2 weeks we will arrange an exercise treadmill to exclude exercise-induced ventricular tachycardia.  He would like to avoid ablation at this point but this can be considered in the future if episodes become more frequent or prolonged.    2 hypertension-blood pressure mildly elevated.  He will track this at home and we will adjust regimen as needed.  3 dizziness-patient symptoms are chronic and no plans for further cardiac evaluation for this issue.  Kirk Ruths, MD

## 2020-02-26 DIAGNOSIS — N200 Calculus of kidney: Secondary | ICD-10-CM | POA: Diagnosis not present

## 2020-03-02 ENCOUNTER — Ambulatory Visit (INDEPENDENT_AMBULATORY_CARE_PROVIDER_SITE_OTHER): Payer: Medicare Other | Admitting: Cardiology

## 2020-03-02 ENCOUNTER — Encounter: Payer: Self-pay | Admitting: Cardiology

## 2020-03-02 ENCOUNTER — Other Ambulatory Visit: Payer: Self-pay

## 2020-03-02 ENCOUNTER — Encounter: Payer: Self-pay | Admitting: *Deleted

## 2020-03-02 VITALS — BP 134/72 | HR 59 | Ht 72.0 in | Wt 228.2 lb

## 2020-03-02 DIAGNOSIS — I10 Essential (primary) hypertension: Secondary | ICD-10-CM | POA: Diagnosis not present

## 2020-03-02 DIAGNOSIS — I471 Supraventricular tachycardia: Secondary | ICD-10-CM | POA: Diagnosis not present

## 2020-03-02 DIAGNOSIS — R42 Dizziness and giddiness: Secondary | ICD-10-CM | POA: Diagnosis not present

## 2020-03-02 MED ORDER — FLECAINIDE ACETATE 100 MG PO TABS: 100.0000 mg | ORAL_TABLET | Freq: Two times a day (BID) | ORAL | 3 refills | Status: DC

## 2020-03-02 NOTE — Patient Instructions (Signed)
Medication Instructions:   INCREASE FLECAINIDE TO 100 GM TWICE DAILY= 2 OF THE 50 MG TABLETS TWICE DAILY  *If you need a refill on your cardiac medications before your next appointment, please call your pharmacy*  Testing/Procedures:  Your physician has requested that you have an exercise tolerance test. For further information please visit [email or web address]. Please also follow instruction sheet, as given.[facility] OFF[facility]   Follow-Up: At [facility], you and your health needs are our priority.  As part of our continuing mission to provide you with exceptional heart care, we have created designated Provider Care Teams.  These Care Teams include your primary Cardiologist (physician) and Advanced Practice Providers (APPs -  Physician Assistants and Nurse Practitioners) who all work together to provide you with the care you need, when you need it.  We recommend signing up for the patient portal called "MyChart".  Sign up information is provided on this After Visit Summary.  MyChart is used to connect with patients for Virtual Visits (Telemedicine).  Patients are able to view lab/test results, encounter notes, upcoming appointments, etc.  Non-urgent messages can be sent to your provider as well.   To learn more about what you can do with MyChart, go to [email or web address].    Your next appointment:   6 month(s)  The format for your next appointment:   In Person  Provider:   Kirk Ruths, MD

## 2020-03-07 ENCOUNTER — Telehealth: Payer: Self-pay | Admitting: Cardiology

## 2020-03-07 NOTE — Telephone Encounter (Signed)
Left message for patient with Dr Creshaw's recommendations.   

## 2020-03-07 NOTE — Telephone Encounter (Signed)
Pt called to ask if [name] thought it would be better for him to increase his beta blocker instead of this Flecainide due to his problem being "fast" heart rates.. I explained to the pt his HR at his last OV was 55 and [name] may not want to lower is rate when not having his tachycardia any further.. he reports that he does play golf and would not want anything to "slow him down"... I advised him that I will forward to [name] for review but to continue his meds as planned and we will let him know if he agrees with a different plan re: his meds.

## 2020-03-07 NOTE — Telephone Encounter (Signed)
Will increase flecainide to prevent tachycardia as outlined in my note.  Would not increase beta-blocker. Kirk Ruths

## 2020-03-07 NOTE — Telephone Encounter (Signed)
Patient is requesting to speak with Dr. Jacalyn Lefevre nurse to discuss his HR and medication management. He states he will discuss in further detail when he speaks with RN. Please call.

## 2020-03-14 DIAGNOSIS — Z23 Encounter for immunization: Secondary | ICD-10-CM | POA: Diagnosis not present

## 2020-03-17 ENCOUNTER — Other Ambulatory Visit (HOSPITAL_COMMUNITY)
Admission: RE | Admit: 2020-03-17 | Discharge: 2020-03-17 | Disposition: A | Discharge status: Home / Self Care | Payer: Medicare Other | Source: Ambulatory Visit | Attending: Cardiology | Admitting: Cardiology

## 2020-03-17 DIAGNOSIS — Z20822 Contact with and (suspected) exposure to covid-19: Secondary | ICD-10-CM | POA: Diagnosis not present

## 2020-03-17 DIAGNOSIS — Z01812 Encounter for preprocedural laboratory examination: Secondary | ICD-10-CM | POA: Diagnosis not present

## 2020-03-18 LAB — SARS CORONAVIRUS 2 (TAT 6-24 HRS): SARS Coronavirus 2: NEGATIVE

## 2020-03-20 ENCOUNTER — Telehealth (HOSPITAL_COMMUNITY): Payer: Self-pay | Admitting: *Deleted

## 2020-03-20 NOTE — Telephone Encounter (Signed)
Close encounter 

## 2020-03-21 ENCOUNTER — Other Ambulatory Visit: Payer: Self-pay

## 2020-03-21 ENCOUNTER — Ambulatory Visit (HOSPITAL_COMMUNITY)
Admission: RE | Admit: 2020-03-21 | Discharge: 2020-03-21 | Disposition: A | Discharge status: Home / Self Care | Payer: Medicare Other | Source: Ambulatory Visit | Attending: Cardiology | Admitting: Cardiology

## 2020-03-21 DIAGNOSIS — I471 Supraventricular tachycardia: Secondary | ICD-10-CM

## 2020-03-21 LAB — EXERCISE TOLERANCE TEST
Estimated workload: 6.6 METS
Exercise duration (min): 4 min
Exercise duration (sec): 40 s
MPHR: 138 {beats}/min
Peak HR: 112 {beats}/min
Percent HR: 81 %
Rest HR: 68 {beats}/min

## 2020-03-28 DIAGNOSIS — E559 Vitamin D deficiency, unspecified: Secondary | ICD-10-CM | POA: Diagnosis not present

## 2020-03-28 DIAGNOSIS — Z23 Encounter for immunization: Secondary | ICD-10-CM | POA: Diagnosis not present

## 2020-03-28 DIAGNOSIS — N200 Calculus of kidney: Secondary | ICD-10-CM | POA: Diagnosis not present

## 2020-03-28 DIAGNOSIS — I471 Supraventricular tachycardia: Secondary | ICD-10-CM | POA: Diagnosis not present

## 2020-03-28 DIAGNOSIS — I10 Essential (primary) hypertension: Secondary | ICD-10-CM | POA: Diagnosis not present

## 2020-03-28 DIAGNOSIS — Z79899 Other long term (current) drug therapy: Secondary | ICD-10-CM | POA: Diagnosis not present

## 2020-04-16 ENCOUNTER — Other Ambulatory Visit: Payer: Self-pay | Admitting: Cardiology

## 2020-04-16 DIAGNOSIS — I471 Supraventricular tachycardia: Secondary | ICD-10-CM

## 2020-04-16 NOTE — Telephone Encounter (Signed)
*  STAT* If patient is at the pharmacy, call can be transferred to refill team.   1. Which medications need to be refilled? (please list name of each medication and dose if known) flecainide 100 mg  2. Which pharmacy/location (including street and city if local pharmacy) is medication to be sent to? [facility]  3. Do they need a 30 day or 90 day supply? Not sure

## 2020-04-18 DIAGNOSIS — M25571 Pain in right ankle and joints of right foot: Secondary | ICD-10-CM | POA: Diagnosis not present

## 2020-04-24 DIAGNOSIS — Z961 Presence of intraocular lens: Secondary | ICD-10-CM | POA: Diagnosis not present

## 2020-04-24 DIAGNOSIS — H52203 Unspecified astigmatism, bilateral: Secondary | ICD-10-CM | POA: Diagnosis not present

## 2020-04-25 ENCOUNTER — Telehealth: Payer: Self-pay | Admitting: Cardiology

## 2020-04-25 NOTE — Telephone Encounter (Signed)
*  STAT* If patient is at the pharmacy, call can be transferred to refill team.   1. Which medications need to be refilled? (please list name of each medication and dose if known)  flecainide (TAMBOCOR) 100 MG tablet  2. Which pharmacy/location (including street and city if local pharmacy) is medication to be sent to? [facility] [place], [place] - [address]  3. Do they need a 30 day or 90 day supply? 90 day supply  Pharmacy states they have not received 100 MG prescription. Patient is almost out of medication. He is requesting a callback to confirm it has been sent to the pharmacy so he can go pick it up. Please advise.

## 2020-04-26 ENCOUNTER — Other Ambulatory Visit: Payer: Self-pay

## 2020-04-26 DIAGNOSIS — I471 Supraventricular tachycardia: Secondary | ICD-10-CM

## 2020-04-26 MED ORDER — FLECAINIDE ACETATE 100 MG PO TABS: 100.0000 mg | ORAL_TABLET | Freq: Two times a day (BID) | ORAL | 3 refills | Status: DC

## 2020-04-26 NOTE — Telephone Encounter (Signed)
     Pt is calling back to follow up. Pt is out of medication and needs refill today

## 2020-04-26 NOTE — Telephone Encounter (Signed)
Spoke with [facility] and they explained they haven't received a script from [place] so I sent another script and the pharmacist stated she received it.

## 2020-05-01 ENCOUNTER — Other Ambulatory Visit: Payer: Self-pay | Admitting: Cardiology

## 2020-05-01 DIAGNOSIS — I10 Essential (primary) hypertension: Secondary | ICD-10-CM

## 2020-05-05 ENCOUNTER — Other Ambulatory Visit: Payer: Self-pay | Admitting: Cardiology

## 2020-08-06 DIAGNOSIS — Z85828 Personal history of other malignant neoplasm of skin: Secondary | ICD-10-CM | POA: Diagnosis not present

## 2020-08-06 DIAGNOSIS — D1801 Hemangioma of skin and subcutaneous tissue: Secondary | ICD-10-CM | POA: Diagnosis not present

## 2020-08-06 DIAGNOSIS — L821 Other seborrheic keratosis: Secondary | ICD-10-CM | POA: Diagnosis not present

## 2020-08-06 DIAGNOSIS — D225 Melanocytic nevi of trunk: Secondary | ICD-10-CM | POA: Diagnosis not present

## 2020-08-06 DIAGNOSIS — L570 Actinic keratosis: Secondary | ICD-10-CM | POA: Diagnosis not present

## 2020-08-06 DIAGNOSIS — L814 Other melanin hyperpigmentation: Secondary | ICD-10-CM | POA: Diagnosis not present

## 2020-08-15 DIAGNOSIS — N200 Calculus of kidney: Secondary | ICD-10-CM | POA: Diagnosis not present

## 2020-08-15 DIAGNOSIS — N281 Cyst of kidney, acquired: Secondary | ICD-10-CM | POA: Diagnosis not present

## 2020-08-15 DIAGNOSIS — R350 Frequency of micturition: Secondary | ICD-10-CM | POA: Diagnosis not present

## 2020-08-15 DIAGNOSIS — N401 Enlarged prostate with lower urinary tract symptoms: Secondary | ICD-10-CM | POA: Diagnosis not present

## 2020-09-19 ENCOUNTER — Telehealth: Payer: Self-pay | Admitting: Cardiology

## 2020-09-19 DIAGNOSIS — R42 Dizziness and giddiness: Secondary | ICD-10-CM

## 2020-09-19 NOTE — Telephone Encounter (Signed)
Pt stated that while playing golf yesterday he started to see double. Pt reports that this only lasted about 15 mins and then was resolved. Pt denies dizziness, SOB, CP at this time. Pt does report that he does feel off balance at times. He did call his opthalmology and was told to call cardiology and would like to be worked in next available.

## 2020-09-19 NOTE — Telephone Encounter (Signed)
Called triage 3x, no answer. Messaged covering staff. LPN states she is "on the phone." Informed patient someone will call him back shortly. Patient did not respond. Please return call when able.

## 2020-09-19 NOTE — Telephone Encounter (Signed)
Patient states he has had double/blurry vision for the past 15 minutes. He states he told his opthamologist and they recommended that he contact cardiology ASAP.

## 2020-09-19 NOTE — Telephone Encounter (Signed)
Spoke with pt, he thinks he may have been a little dehydrated playing golf yesterday. He also reports over the last 6 months his balance has been off and he will occasional have lightheadedness. He has not been checking his blood pressure and he was ask to start tracking it once or twice daily. Carotid dopplers scheduled and follow up appointment with the app made.

## 2020-09-21 ENCOUNTER — Ambulatory Visit (HOSPITAL_COMMUNITY)
Admission: RE | Admit: 2020-09-21 | Discharge: 2020-09-21 | Disposition: A | Discharge status: Home / Self Care | Payer: Medicare Other | Source: Ambulatory Visit | Attending: Cardiology | Admitting: Cardiology

## 2020-09-21 ENCOUNTER — Other Ambulatory Visit: Payer: Self-pay

## 2020-09-21 DIAGNOSIS — R42 Dizziness and giddiness: Secondary | ICD-10-CM | POA: Insufficient documentation

## 2020-09-28 ENCOUNTER — Ambulatory Visit (INDEPENDENT_AMBULATORY_CARE_PROVIDER_SITE_OTHER): Payer: Medicare Other | Admitting: Physician Assistant

## 2020-09-28 ENCOUNTER — Encounter: Payer: Self-pay | Admitting: Physician Assistant

## 2020-09-28 ENCOUNTER — Other Ambulatory Visit: Payer: Self-pay

## 2020-09-28 VITALS — BP 118/68 | HR 60 | Ht 72.0 in | Wt 235.2 lb

## 2020-09-28 DIAGNOSIS — H53452 Other localized visual field defect, left eye: Secondary | ICD-10-CM

## 2020-09-28 DIAGNOSIS — R2689 Other abnormalities of gait and mobility: Secondary | ICD-10-CM

## 2020-09-28 DIAGNOSIS — I10 Essential (primary) hypertension: Secondary | ICD-10-CM | POA: Diagnosis not present

## 2020-09-28 DIAGNOSIS — I471 Supraventricular tachycardia: Secondary | ICD-10-CM

## 2020-09-28 NOTE — Progress Notes (Signed)
Cardiology Office Note:    Date:  09/28/2020   ID:  [name] [name], DOB 06-09-1935, MRN [id number]  PCP:  [name] Huddle, MD   [place] [facility] Cardiologist:  [name] Ruths, MD {    Referring MD: [name] Huddle, MD   Chief Complaint  Patient presents with  . Follow-up    History of Present Illness:    [name] [name] is a 85 y.o. male with a hx of HTN and SVT.  He had a prior history of ablation for frequent PVCs done at [facility] clinic in early 1990s.  He also had a cardiac catheterization in 2001 that showed normal coronary arteries.  Most recent Myoview in July 2014 showed EF 59%, no ischemia or wall motion abnormality.  He has chronically abnormal EKG with abnormal T waves.  Heart monitor in May 2017 shows sinus rhythm with PVCs and SVT.  Echocardiogram in July 2017 showed normal LV function, grade 1 DD, trace AI.  He has been seen by Dr. Caryl [name] and was placed on flecainide for atrial tachycardia with improvement in the symptoms.  He was also seen by Dr. Rayann [name] for possible ablation but patient preferred medical therapy.  ETT in October 2017 showed no exercise-induced VT. Myoview obtained in July 2019 showed EF 46%, normal perfusion.  Subsequent echocardiogram in July 2019 showed a normal EF.  Patient was last seen by Dr. Stanford [name] on 02/29/2020 which time he was having increasing palpitation, all episodes were short-lived.  Flecainide was increased to 100 mg twice a day.  Subsequent ETT obtained on 04/17/2020 showed no exercise-induced VT, he was able to achieve a workload of 6.6 METS of activity.  He achieved 81% of the target heart rate.  Patient presents today for evaluation of left eye vision changes.  He says he was seeing double vision in the left eye but not in the right eye. This occurred last Tuesday while he went out golfing.  Overall, symptom lasted about 15 minutes before resolving by itself.  This has not recurred again.  Patient did not seek medical attention at the time.  On  physical exam today, I did a full neuro exam which did not reveal any focal deficit.  Visual field is normal.  Prior to that, he has had several months of imbalance.  Subsequent carotid artery ultrasound shows no significant disease.  Orthostatic vital sign was negative.    Past Medical History:  Diagnosis Date  . Aortic sclerosis    MILD PER ECHO AS WELL AS TRACE MITRAL REGURGITATION - PER CARDIOLOGY OFFICE NOTES 06/2012  . Arthritis   . BPH (benign prostatic hyperplasia)    s/p TURP in July 2012  . Complication of anesthesia    HX OF MINOR ITCHING AFTER ANESTHESIA FOR HIP REPLACEMENT AND FOR COLON SURGERY  . EKG abnormalities    INCOMPLETE RIGHT BUNDLE BRANCH BLOCK AND CHRONIC T-WAVE ABNORMALITIES - PER CARDIOLOGY OFFICE NOTES 06/2012  . GERD (gastroesophageal reflux disease)   . History of paroxysmal supraventricular tachycardia    [name]  . Hypertension   . Palpitations    History of ventricular ectopy. He is status post radiofrequency ablation at the [facility] in the early 90's. for PVC's  . Vertigo, benign positional    [name]    Past Surgical History:  Procedure Laterality Date  . ANKLE SURGERY    . CARDIAC CATHETERIZATION  2002   NORMAL CORONARY  ARTERIES  . COLON SURGERY  ? 1994  . EXTRACORPOREAL SHOCK WAVE LITHOTRIPSY Right 07/23/2017   Procedure: RIGHT EXTRACORPOREAL SHOCK WAVE LITHOTRIPSY (ESWL);  Surgeon: Alexis Frock, MD;  Location: [facility];  Service: Urology;  Laterality: Right;  . HERNIA REPAIR  1990  . JOINT REPLACEMENT  ? 2009   LEFT HIP REPLACEMENT  . KIDNEY STONE SURGERY    . right shoulder rotator cuff repair    . TONSILLECTOMY  Age 79  . TOTAL HIP ARTHROPLASTY Right 08/04/2013   Procedure: RIGHT TOTAL HIP ARTHROPLASTY ANTERIOR APPROACH;  Surgeon: Mauri Pole, MD;  Location: [facility];  Service: Orthopedics;  Laterality: Right;  . TOTAL KNEE ARTHROPLASTY Bilateral  09/13/2012   Procedure: TOTAL KNEE BILATERAL;  Surgeon: Mauri Pole, MD;  Location: [facility];  Service: Orthopedics;  Laterality: Bilateral;  . TRANSURETHRAL RESECTION OF PROSTATE    . US ECHOCARDIOGRAPHY  06-20-2009   Est EF 55-60%    Current Medications: Current Meds  Medication Sig  . celecoxib (CELEBREX) 200 MG capsule Take 200 mg by mouth every other day.  . Cholecalciferol (VITAMIN D PO) Take 2,000 Units by mouth daily.  . flecainide (TAMBOCOR) 100 MG tablet Take 1 tablet (100 mg total) by mouth 2 (two) times daily.  Marland Kitchen losartan (COZAAR) 100 MG tablet TAKE ONE TABLET DAILY  . metoprolol succinate (TOPROL-XL) 25 MG 24 hr tablet TAKE 1+1/2 TABLET EVERY MORNING  . metoprolol succinate (TOPROL-XL) 50 MG 24 hr tablet TAKE ONE TABLET DAILY -DO NOT CRUSH  . metoprolol tartrate (LOPRESSOR) 50 MG tablet TAKE AS NEEDED -TAKE 1/2 TO 1 TABLET IF HEART RATE GREATER THAN 90  . polycarbophil (FIBERCON) 625 MG tablet Take 625 mg by mouth daily as needed for mild constipation.  . potassium citrate (UROCIT-K) 10 MEQ (1080 MG) SR tablet Take 10 mEq by mouth daily.      Allergies:   Hydrocodone-acetaminophen, Oxycodone, and Ramipril   Social History   Socioeconomic History  . Marital status: Married    Spouse name: Not on file  . Number of children: Not on file  . Years of education: Not on file  . Highest education level: Not on file  Occupational History  . Not on file  Tobacco Use  . Smoking status: Never Smoker  . Smokeless tobacco: Never Used  Vaping Use  . Vaping Use: Never used  Substance and Sexual Activity  . Alcohol use: Yes    Alcohol/week: 0.0 standard drinks    Comment: Drink Wine almost daily  . Drug use: No  . Sexual activity: Not on file  Other Topics Concern  . Not on file  Social History Narrative  . Not on file   Social Determinants of Health   Financial Resource Strain: Not on file  Food Insecurity: Not on file  Transportation Needs: Not on file  Physical  Activity: Not on file  Stress: Not on file  Social Connections: Not on file     Family History: The patient's family history includes Heart attack in his mother. There is no history of Other.  ROS:   Please see the history of present illness.     All other systems reviewed and are negative.  EKGs/Labs/Other Studies Reviewed:    The following studies were reviewed today:  Echo 12/03/2017 LV EF: 55% -  60%  Study Conclusions   - Left ventricle: The cavity size was normal. Wall thickness was  increased in a pattern of mild LVH. Systolic function was normal.  The estimated ejection fraction was in the range of 55% to 60%.  Wall motion was normal; there were no regional wall motion  abnormalities.    ETT 03/21/2020  Exercise time 4 minutes and 40 seconds, decreased exercise effort.  He achieved a workload of 6.6 METS  Complained of fatigue and shortness of breath.  Baseline ECG showed nonspecific ST-T wave changes most notably T wave inversion in the early precordial leads  He achieved 81% of target heart rate, slightly below 85%. Technically nondiagnostic study due to failure to achieve heart rate, however no gross electrocardiographic ischemic changes noted.  There was accentuation of the nonspecific ST-T wave changes during exercise. No adverse arrhythmias. He is currently on flecainide.  There is mild increase/widening in QRS duration of approximately 40 ms when comparing rest to peak stress/beginning of recovery.    EKG:  EKG is not ordered today.   Recent Labs: No results found for requested labs within last 8760 hours.  Recent Lipid Panel No results found for: CHOL, TRIG, HDL, CHOLHDL, VLDL, LDLCALC, LDLDIRECT   Risk Assessment/Calculations:       Physical Exam:    VS:  BP 118/68   Pulse 60   Ht 6' (1.829 m)   Wt 235 lb 3.2 oz (106.7 kg)   BMI 31.90 kg/m     Wt Readings from Last 3 Encounters:  09/28/20 235 lb 3.2 oz (106.7 kg)  03/02/20  228 lb 3.2 oz (103.5 kg)  03/21/19 234 lb (106.1 kg)     GEN:  Well nourished, well developed in no acute distress HEENT: Normal NECK: No JVD; No carotid bruits LYMPHATICS: No lymphadenopathy CARDIAC: RRR, no murmurs, rubs, gallops RESPIRATORY:  Clear to auscultation without rales, wheezing or rhonchi  ABDOMEN: Soft, non-tender, non-distended MUSCULOSKELETAL:  No edema; No deformity  SKIN: Warm and dry NEUROLOGIC:  Alert and oriented x 3 PSYCHIATRIC:  Normal affect   ASSESSMENT:    1. Abnormal peripheral vision of left eye   2. SVT (supraventricular tachycardia) ([facility])   3. Essential hypertension    PLAN:    In order of problems listed above:  1. Left eye vision abnormality: Solitary episode occurred last Tuesday while coughing were he had a double vision in the left eye.  Symptom does not occur when he is looking through the right eye.  He denies any significant dizziness during the episode.  I recommend he keep himself hydrated.  Since the symptom occurred nearly 10 days ago, getting a head CT is unlikely to change his treatment.  I will discussed the case with Dr. Stanford [name], at this time I do not recommend further work-up  2. Imbalance: Symptom of imbalance has been going on for several month.  When he closes eyes while standing, his body seems to wobble back-and-forth.  This seems to suggest positive Romberg test.  Question if he need to be referred to neurology service  3. SVT: History of PVC ablation.  On flecainide and metoprolol  4. Hypertension: blood pressure stable.    Medication Adjustments/Labs and Tests Ordered: Current medicines are reviewed at length with the patient today.  Concerns regarding medicines are outlined above.  No orders of the defined types were placed in this encounter.  No orders of the defined types were placed in this encounter.   Patient Instructions  Medication Instructions:  Your physician recommends that you continue on your current  medications as directed. Please refer to the Current Medication list given to you today.  *If you  need a refill on your cardiac medications before your next appointment, please call your pharmacy*  Lab Work: NONE ordered at this time of appointment   If you have labs (blood work) drawn today and your tests are completely normal, you will receive your results only by: Marland Kitchen MyChart Message (if you have MyChart) OR . A paper copy in the mail If you have any lab test that is abnormal or we need to change your treatment, we will call you to review the results.  Testing/Procedures: NONE ordered at this time of appointment   Follow-Up: At [facility], you and your health needs are our priority.  As part of our continuing mission to provide you with exceptional heart care, we have created designated Provider Care Teams.  These Care Teams include your primary Cardiologist (physician) and Advanced Practice Providers (APPs -  Physician Assistants and Nurse Practitioners) who all work together to provide you with the care you need, when you need it.  We recommend signing up for the patient portal called "MyChart".  Sign up information is provided on this After Visit Summary.  MyChart is used to connect with patients for Virtual Visits (Telemedicine).  Patients are able to view lab/test results, encounter notes, upcoming appointments, etc.  Non-urgent messages can be sent to your provider as well.   To learn more about what you can do with MyChart, go to [email or web address].    Your next appointment:   6 month(s)  The format for your next appointment:   In Person  Provider:   Kirk Ruths, MD   Other Instructions      Signed, Almyra Deforest, [place]  09/28/2020 11:31 PM    [place]

## 2020-09-28 NOTE — Patient Instructions (Signed)
Medication Instructions:  Your physician recommends that you continue on your current medications as directed. Please refer to the Current Medication list given to you today.  *If you need a refill on your cardiac medications before your next appointment, please call your pharmacy*  Lab Work: NONE ordered at this time of appointment   If you have labs (blood work) drawn today and your tests are completely normal, you will receive your results only by: . MyChart Message (if you have MyChart) OR . A paper copy in the mail If you have any lab test that is abnormal or we need to change your treatment, we will call you to review the results.  Testing/Procedures: NONE ordered at this time of appointment   Follow-Up: At [facility], you and your health needs are our priority.  As part of our continuing mission to provide you with exceptional heart care, we have created designated Provider Care Teams.  These Care Teams include your primary Cardiologist (physician) and Advanced Practice Providers (APPs -  Physician Assistants and Nurse Practitioners) who all work together to provide you with the care you need, when you need it.  We recommend signing up for the patient portal called "MyChart".  Sign up information is provided on this After Visit Summary.  MyChart is used to connect with patients for Virtual Visits (Telemedicine).  Patients are able to view lab/test results, encounter notes, upcoming appointments, etc.  Non-urgent messages can be sent to your provider as well.   To learn more about what you can do with MyChart, go to [email or web address].    Your next appointment:   6 month(s)  The format for your next appointment:   In Person  Provider:   Brian Crenshaw, MD  Other Instructions   

## 2020-09-29 ENCOUNTER — Other Ambulatory Visit: Payer: Self-pay | Admitting: Physician Assistant

## 2020-09-29 ENCOUNTER — Telehealth: Payer: Self-pay | Admitting: Physician Assistant

## 2020-09-29 DIAGNOSIS — G459 Transient cerebral ischemic attack, unspecified: Secondary | ICD-10-CM

## 2020-09-29 NOTE — Progress Notes (Signed)
mri

## 2020-09-29 NOTE — Telephone Encounter (Signed)
I spoke with [name] about Mr. 's case, [name] would recommend referral to neurology service and MRI of the brain prior to him seeing neurology.     We will try to arrange MRI of the brain. (order placed, need scheduling)

## 2020-10-01 ENCOUNTER — Other Ambulatory Visit: Payer: Self-pay | Admitting: *Deleted

## 2020-10-01 ENCOUNTER — Telehealth: Payer: Self-pay | Admitting: Physician Assistant

## 2020-10-01 DIAGNOSIS — Z01812 Encounter for preprocedural laboratory examination: Secondary | ICD-10-CM

## 2020-10-01 DIAGNOSIS — I10 Essential (primary) hypertension: Secondary | ICD-10-CM

## 2020-10-01 NOTE — Telephone Encounter (Signed)
Spoke with patient regarding the Thursday 10/11/20 at 1:00pm at [facility] time is 12:30 pm---1st floor radiology for check in.  Patient to come in this week for labs-----patient voiced his understanding.

## 2020-10-01 NOTE — Telephone Encounter (Signed)
Preferably within the next 3 weeks. Symptom resolved over 10 days before [name] was seen in the clinic. So it is not absolutely emergent.

## 2020-10-02 ENCOUNTER — Other Ambulatory Visit: Payer: Self-pay

## 2020-10-02 DIAGNOSIS — R2689 Other abnormalities of gait and mobility: Secondary | ICD-10-CM

## 2020-10-02 DIAGNOSIS — H539 Unspecified visual disturbance: Secondary | ICD-10-CM

## 2020-10-02 NOTE — Progress Notes (Signed)
Placed order for Neurology referral per message from [facility], [place]

## 2020-10-02 NOTE — Telephone Encounter (Signed)
Patient is scheduled for MRI on 10/11/20 at 1:00 PM

## 2020-10-05 DIAGNOSIS — Z01812 Encounter for preprocedural laboratory examination: Secondary | ICD-10-CM | POA: Diagnosis not present

## 2020-10-05 DIAGNOSIS — I10 Essential (primary) hypertension: Secondary | ICD-10-CM | POA: Diagnosis not present

## 2020-10-05 LAB — BASIC METABOLIC PANEL
BUN/Creatinine Ratio: 18 (ref 10–24)
BUN: 22 mg/dL (ref 8–27)
CO2: 23 mmol/L (ref 20–29)
Calcium: 9.2 mg/dL (ref 8.6–10.2)
Chloride: 103 mmol/L (ref 96–106)
Creatinine, Ser: 1.24 mg/dL (ref 0.76–1.27)
Glucose: 108 mg/dL — ABNORMAL HIGH (ref 65–99)
Potassium: 4.6 mmol/L (ref 3.5–5.2)
Sodium: 141 mmol/L (ref 134–144)
eGFR: 58 mL/min/{1.73_m2} — ABNORMAL LOW (ref >59)

## 2020-10-11 ENCOUNTER — Ambulatory Visit (HOSPITAL_COMMUNITY)
Admission: RE | Admit: 2020-10-11 | Discharge: 2020-10-11 | Disposition: A | Discharge status: Home / Self Care | Payer: Medicare Other | Source: Ambulatory Visit | Attending: Physician Assistant | Admitting: Physician Assistant

## 2020-10-11 ENCOUNTER — Other Ambulatory Visit: Payer: Self-pay

## 2020-10-11 DIAGNOSIS — H532 Diplopia: Secondary | ICD-10-CM | POA: Diagnosis not present

## 2020-10-11 DIAGNOSIS — I639 Cerebral infarction, unspecified: Secondary | ICD-10-CM | POA: Diagnosis not present

## 2020-10-11 DIAGNOSIS — G459 Transient cerebral ischemic attack, unspecified: Secondary | ICD-10-CM | POA: Diagnosis not present

## 2020-10-11 IMAGING — MR MR HEAD WO/W CM
15 series · 48 of 48 positions shown · IV contrast (gadavist)
Comparison: Head CT [DATE]

CLINICAL DATA: Episode of left-sided double vision [DATE],
lasting 15 minutes, evaluate for CVA

EXAM:
MRI HEAD WITHOUT AND WITH CONTRAST
TECHNIQUE: Multiplanar, multiecho pulse sequences of the brain and surrounding
structures were obtained without and with intravenous contrast.
CONTRAST:  10mL GADAVIST GADOBUTROL 1 MMOL/ML IV SOLN

[Series 5: DWI · axial · 3.0mm · 1.36mm/px · z∈[-20,+126]mm · 7 of 104 slices shown (1 of 2)]
[im 1/104]
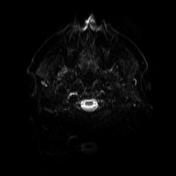
[im 18/104]
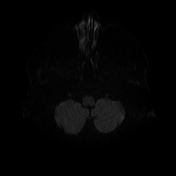
[im 35/104]
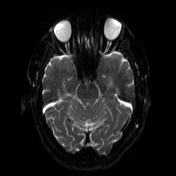
[im 52/104]
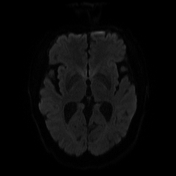
[im 69/104]
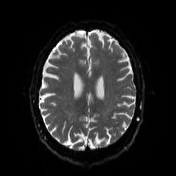
[im 86/104]
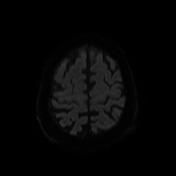
[im 104/104]
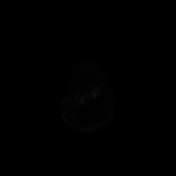

[Series 6: DWI · axial · 3.0mm · 1.36mm/px · z∈[-20,+126]mm · 4 of 52 slices shown (2 of 2)]
[im 1/52]
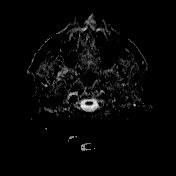
[im 18/52]
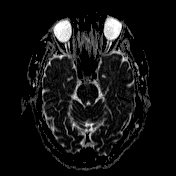
[im 35/52]
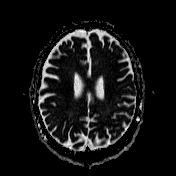
[im 52/52]
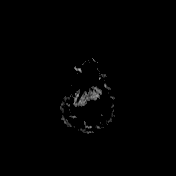

[Series 7: T1 · sagittal · 5.0mm · 0.75mm/px · 1 of 24 slices shown (1 of 4)]
[im 1/24]
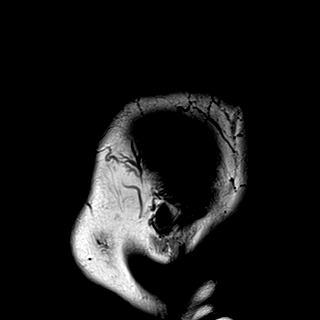

[Series 8: T2 · axial · 5.0mm · 0.62mm/px · 1 of 26 slices shown]
[im 1/26]
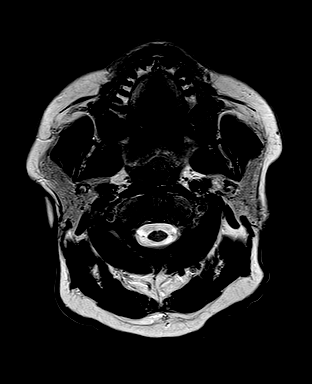

[Series 9: swi_images · axial · 3.0mm · 0.75mm/px · z∈[-31,+126]mm · 3 of 56 slices shown]
[im 1/56]
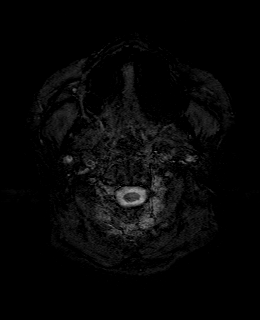
[im 28/56]
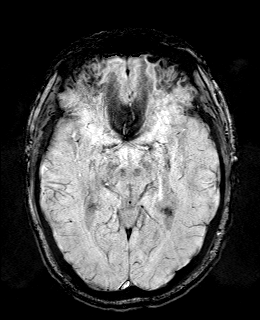
[im 56/56]
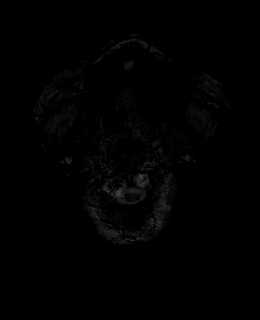

[Series 11: FLAIR · axial · 3.0mm · 0.75mm/px · z∈[-28,+124]mm · 3 of 54 slices shown]
[im 1/54]
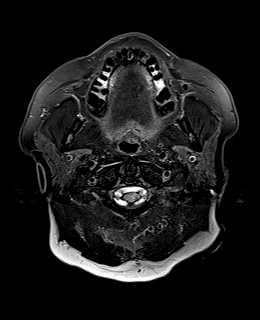
[im 27/54]
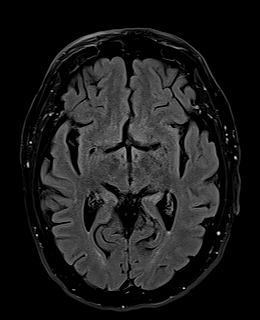
[im 54/54]
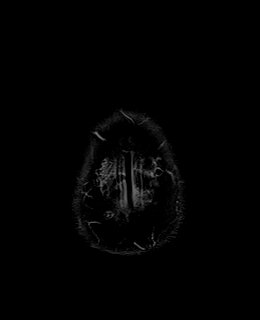

[Series 12: T1 · axial · 1.0mm · 0.94mm/px · z∈[-22,+129]mm · 9 of 160 slices shown (2 of 4)]
[im 1/160]
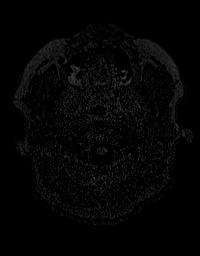
[im 20/160]
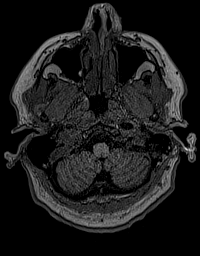
[im 40/160]
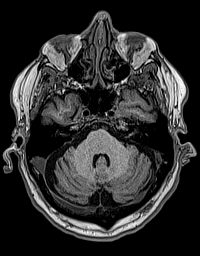
[im 60/160]
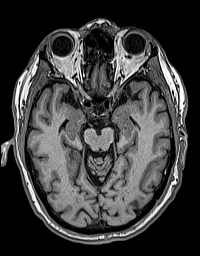
[im 80/160]
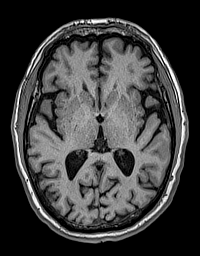
[im 100/160]
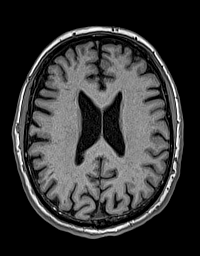
[im 120/160]
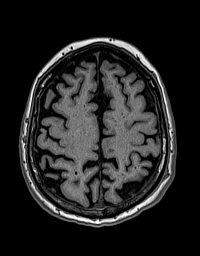
[im 140/160]
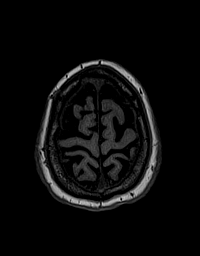
[im 160/160]
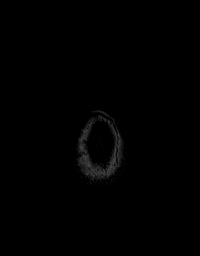

[Series 13: cor dwi_tracew · coronal · 5.0mm · 1.53mm/px · 3 of 52 slices shown]
[im 1/52]
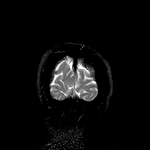
[im 26/52]
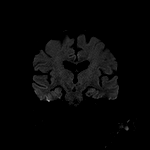
[im 52/52]
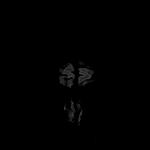

[Series 14: cor dwi_adc · coronal · 5.0mm · 1.53mm/px · 1 of 26 slices shown]
[im 1/26]
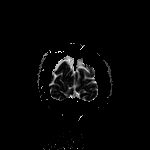

[Series 15: T2 post-contrast · coronal · 5.0mm · 0.57mm/px · 1 of 24 slices shown]
[im 1/24]
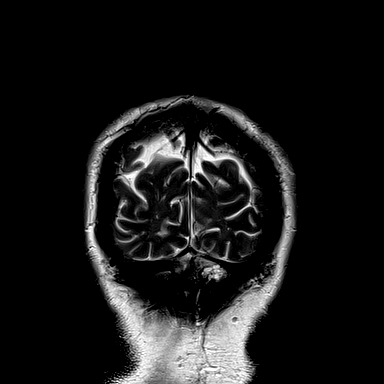

[Series 16: T1 post-contrast · axial · 1.0mm · 0.94mm/px · z∈[-22,+129]mm · 9 of 160 slices shown (1 of 3)]
[im 1/160]
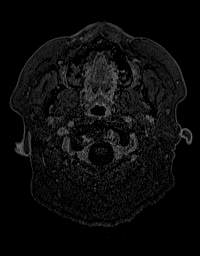
[im 20/160]
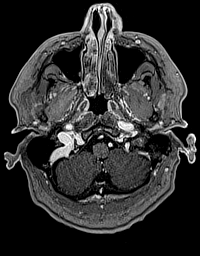
[im 40/160]
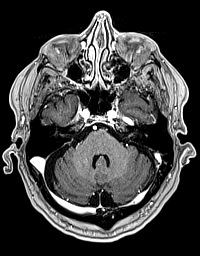
[im 60/160]
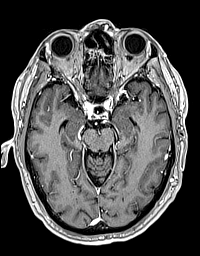
[im 80/160]
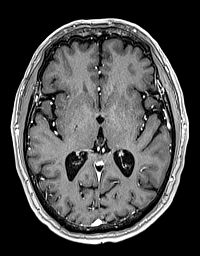
[im 100/160]
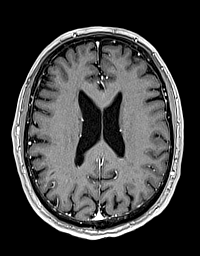
[im 120/160]
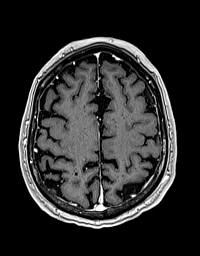
[im 140/160]
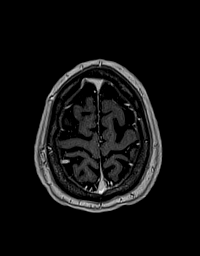
[im 160/160]
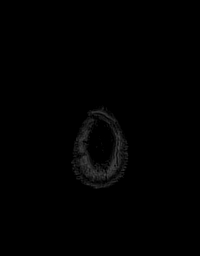

[Series 17: T1 · sagittal · 4.0mm · 0.94mm/px · 2 of 30 slices shown (3 of 4)]
[im 1/30]
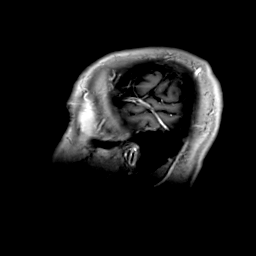
[im 30/30]
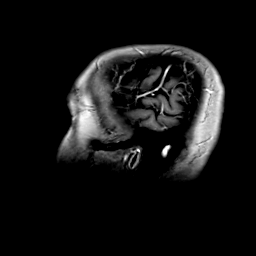

[Series 18: T1 · coronal · 4.0mm · 0.94mm/px · 2 of 30 slices shown (4 of 4)]
[im 1/30]
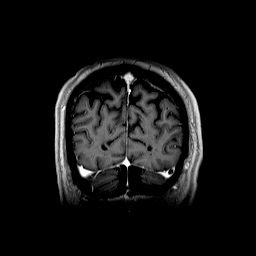
[im 30/30]
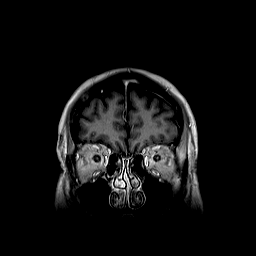

[Series 19: T1 post-contrast · coronal · 5.0mm · 0.43mm/px · 1 of 24 slices shown (2 of 3)]
[im 1/24]
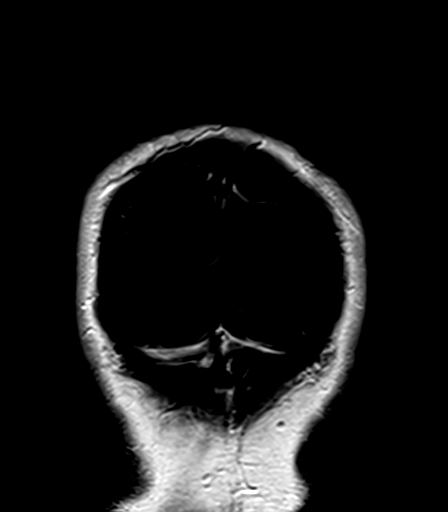

[Series 20: T1 post-contrast · sagittal · 5.0mm · 0.75mm/px · 1 of 24 slices shown (3 of 3)]
[im 1/24]
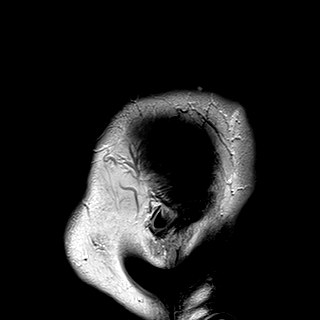

[48 of 48 positions shown; findings below may reference images not displayed]

FINDINGS: Brain: No acute or subacute infarction, hemorrhage, hydrocephalus,
or extra-axial collection.

Mass in the left ventral pons with bulging into the prepontine
cistern, measuring 13 mm on axial T2 weighted imaging. There is an
internal honeycomb T1 and T2 appearance with chronic blood products
and faint enhancement. With the benefit of this study a small
hyperdense mass is seen in this area on head CT from [7R]. No
adjacent brain edema at to correlate with recent ocular symptoms.

Age congruent appearance of the white matter. Normal brain volume
for age.

Vascular: Normal flow voids and vascular enhancements

Skull and upper cervical spine: Normal marrow signal

Sinuses/Orbits: Negative
IMPRESSION: 1. No acute or subacute insult.
2. 13 mm pontine mass with features consistent with cavernoma. No
evidence of recent hemorrhage.

## 2020-10-11 MED ORDER — GADOBUTROL 1 MMOL/ML IV SOLN
10.0000 mL | Freq: Once | INTRAVENOUS | Status: AC | PRN
  Administered 2020-10-11: 10 mL via INTRAVENOUS

## 2020-11-05 ENCOUNTER — Ambulatory Visit (INDEPENDENT_AMBULATORY_CARE_PROVIDER_SITE_OTHER): Payer: Medicare Other | Admitting: Diagnostic Neuroimaging

## 2020-11-05 ENCOUNTER — Encounter: Payer: Self-pay | Admitting: Diagnostic Neuroimaging

## 2020-11-05 VITALS — BP 127/76 | HR 49 | Ht 72.0 in | Wt 236.4 lb

## 2020-11-05 DIAGNOSIS — Q283 Other malformations of cerebral vessels: Secondary | ICD-10-CM

## 2020-11-05 DIAGNOSIS — H539 Unspecified visual disturbance: Secondary | ICD-10-CM | POA: Diagnosis not present

## 2020-11-05 DIAGNOSIS — H532 Diplopia: Secondary | ICD-10-CM

## 2020-11-05 NOTE — Progress Notes (Signed)
[facility]  PATIENT: [name] [name] DOB: September 13, 1935  REFERRING CLINICIAN: Josetta Huddle, MD HISTORY FROM: patient  REASON FOR VISIT: new consult    HISTORICAL  CHIEF COMPLAINT:  Chief Complaint  Patient presents with   Vision changes, imbalance    Rm 7 New Pt  "I had double vision in my left eye x 10 min; balance is not as good as it used to be; have felt off balance x 1 year, no falls"    HISTORY OF PRESENT ILLNESS:   85 here for evaluation of transient aphasia.  09/17/2020 patient had 15-minute episode of double vision.  This was present with both eyes open as well as with only the left eye open.  With right open he did not have double vision.  No other associated symptoms.  No nausea or vomiting, headaches, numbness, slurred speech or tingling or weakness.  No loss of vision.  Double vision was overlapping images on a diagonal.  Patient has had bilateral lens implants in the past.  He spoke with his ophthalmologist and then saw cardiology.  MRI brain was obtained which showed no acute findings but he also had an incidental pontine cavernous malformation.  Patient is back to baseline.  No recurrence of symptoms.    REVIEW OF SYSTEMS: Full 14 system review of systems performed and negative with exception of: As per HPI.  ALLERGIES: Allergies  Allergen Reactions   Hydrocodone-Acetaminophen Other (See Comments)    Confusion   Oxycodone Other (See Comments)    Angry and confused   Ramipril     Cough    HOME MEDICATIONS: Outpatient Medications Prior to Visit  Medication Sig Dispense Refill   celecoxib (CELEBREX) 200 MG capsule Take 200 mg by mouth every other day.     Cholecalciferol (VITAMIN D PO) Take 2,000 Units by mouth daily.     flecainide (TAMBOCOR) 100 MG tablet Take 1 tablet (100 mg total) by mouth 2 (two) times daily. 180 tablet 3   losartan (COZAAR) 100 MG tablet TAKE ONE TABLET DAILY 90 tablet 2   metoprolol succinate (TOPROL-XL) 50 MG 24 hr  tablet TAKE ONE TABLET DAILY -DO NOT CRUSH 90 tablet 3   metoprolol tartrate (LOPRESSOR) 50 MG tablet TAKE AS NEEDED -TAKE 1/2 TO 1 TABLET IF HEART RATE GREATER THAN 90 90 tablet 1   polycarbophil (FIBERCON) 625 MG tablet Take 625 mg by mouth daily as needed for mild constipation.     potassium citrate (UROCIT-K) 10 MEQ (1080 MG) SR tablet Take 10 mEq by mouth daily.      metoprolol succinate (TOPROL-XL) 25 MG 24 hr tablet TAKE 1+1/2 TABLET EVERY MORNING 135 tablet 6   No facility-administered medications prior to visit.    PAST MEDICAL HISTORY: Past Medical History:  Diagnosis Date   Aortic sclerosis    MILD PER ECHO AS WELL AS TRACE MITRAL REGURGITATION - PER CARDIOLOGY OFFICE NOTES 06/2012   Arthritis    BPH (benign prostatic hyperplasia)    s/p TURP in July 0865   Complication of anesthesia    HX OF MINOR ITCHING AFTER ANESTHESIA FOR HIP REPLACEMENT AND FOR COLON SURGERY   EKG abnormalities    INCOMPLETE RIGHT BUNDLE BRANCH BLOCK AND CHRONIC T-WAVE ABNORMALITIES - PER CARDIOLOGY OFFICE NOTES 06/2012   GERD (gastroesophageal reflux disease)    History of paroxysmal supraventricular tachycardia    [name]   Hypertension    Palpitations    History of ventricular ectopy. He  is status post radiofrequency ablation at the [facility] in the early 90's. for PVC's   Vertigo, benign positional    [name]    PAST SURGICAL HISTORY: Past Surgical History:  Procedure Laterality Date   ANKLE SURGERY     CARDIAC CATHETERIZATION  2002   NORMAL CORONARY ARTERIES   COLON SURGERY  ? 1994   EXTRACORPOREAL SHOCK WAVE LITHOTRIPSY Right 07/23/2017   Procedure: RIGHT EXTRACORPOREAL SHOCK WAVE LITHOTRIPSY (ESWL);  Surgeon: Alexis Frock, MD;  Location: [facility];  Service: Urology;  Laterality: Right;   HERNIA REPAIR  1990   JOINT REPLACEMENT  ? 2009   LEFT HIP REPLACEMENT   KIDNEY STONE SURGERY     right  shoulder rotator cuff repair     TONSILLECTOMY  Age 39   TOTAL HIP ARTHROPLASTY Right 08/04/2013   Procedure: RIGHT TOTAL HIP ARTHROPLASTY ANTERIOR APPROACH;  Surgeon: Mauri Pole, MD;  Location: [facility];  Service: Orthopedics;  Laterality: Right;   TOTAL KNEE ARTHROPLASTY Bilateral 09/13/2012   Procedure: TOTAL KNEE BILATERAL;  Surgeon: Mauri Pole, MD;  Location: [facility];  Service: Orthopedics;  Laterality: Bilateral;   TRANSURETHRAL RESECTION OF PROSTATE     US ECHOCARDIOGRAPHY  06-20-2009   Est EF 55-60%    FAMILY HISTORY: Family History  Problem Relation Age of Onset   Heart attack Mother    Other Neg Hx     SOCIAL HISTORY: Social History   Socioeconomic History   Marital status: Married    Spouse name: Ulis Rias   Number of children: 3   Years of education: Not on file   Highest education level: Bachelor's degree (e.g., BA, AB, BS)  Occupational History   Not on file  Tobacco Use   Smoking status: Never   Smokeless tobacco: Never  Vaping Use   Vaping Use: Never used  Substance and Sexual Activity   Alcohol use: Yes    Comment: Drink 1 glass Wine almost daily   Drug use: No   Sexual activity: Not on file  Other Topics Concern   Not on file  Social History Narrative   Lives with wife   Social Determinants of Health   Financial Resource Strain: Not on file  Food Insecurity: Not on file  Transportation Needs: Not on file  Physical Activity: Not on file  Stress: Not on file  Social Connections: Not on file  Intimate Partner Violence: Not on file     PHYSICAL EXAM  GENERAL EXAM/CONSTITUTIONAL: Vitals:  Vitals:   11/05/20 1125  BP: 127/76  Pulse: (!) 49  Weight: 236 lb 6.4 oz (107.2 kg)  Height: 6' (1.829 m)   Body mass index is 32.06 kg/m. Wt Readings from Last 3 Encounters:  11/05/20 236 lb 6.4 oz (107.2 kg)  09/28/20 235 lb 3.2 oz (106.7 kg)  03/02/20 228 lb 3.2 oz (103.5 kg)   Patient is in no distress; well developed, nourished and groomed; neck  is supple  CARDIOVASCULAR: Examination of carotid arteries is normal; no carotid bruits Regular rate and rhythm, no murmurs Examination of peripheral vascular system by observation and palpation is normal  EYES: Ophthalmoscopic exam of optic discs and posterior segments is normal; no papilledema or hemorrhages No results found.  MUSCULOSKELETAL: Gait, strength, tone, movements noted in Neurologic exam below  NEUROLOGIC: MENTAL STATUS:  No flowsheet data found. awake, alert, oriented to person, place and time recent and remote memory intact normal attention  and concentration language fluent, comprehension intact, naming intact fund of knowledge appropriate  CRANIAL NERVE:  2nd - no papilledema on fundoscopic exam 2nd, 3rd, 4th, 6th - pupils equal and reactive to light, visual fields full to confrontation, extraocular muscles intact, no nystagmus 5th - facial sensation symmetric 7th - facial strength symmetric 8th - hearing intact 9th - palate elevates symmetrically, uvula midline 11th - shoulder shrug symmetric 12th - tongue protrusion midline  MOTOR:  normal bulk and tone, full strength in the BUE, BLE  SENSORY:  normal and symmetric to light touch, pinprick, temperature, vibration  COORDINATION:  finger-nose-finger, fine finger movements normal  REFLEXES:  deep tendon reflexes present and symmetric  GAIT/STATION:  narrow based gait     DIAGNOSTIC DATA (LABS, IMAGING, TESTING) - I reviewed patient records, labs, notes, testing and imaging myself where available.  Lab Results  Component Value Date   WBC 5.4 12/17/2015   HGB 15.8 12/17/2015   HCT 46.3 12/17/2015   MCV 91.5 12/17/2015   PLT 174 12/17/2015      Component Value Date/Time   NA 141 10/05/2020 0933   K 4.6 10/05/2020 0933   CL 103 10/05/2020 0933   CO2 23 10/05/2020 0933   GLUCOSE 108 (H) 10/05/2020 0933   GLUCOSE 113 (H) 12/17/2015 0832   BUN 22 10/05/2020 0933   CREATININE 1.24  10/05/2020 0933   CREATININE 1.20 (H) 12/17/2015 0832   CALCIUM 9.2 10/05/2020 0933   PROT 5.4 (L) 09/18/2012 1000   ALBUMIN 2.7 (L) 09/18/2012 1000   AST 34 09/18/2012 1000   ALT 26 09/18/2012 1000   ALKPHOS 74 09/18/2012 1000   BILITOT 0.8 09/18/2012 1000   GFRNONAA 52 (L) 04/20/2018 1625   GFRAA 60 04/20/2018 1625   No results found for: CHOL, HDL, LDLCALC, LDLDIRECT, TRIG, CHOLHDL No results found for: HGBA1C No results found for: VITAMINB12 Lab Results  Component Value Date   TSH 2.08 12/17/2015    12/03/17 TTE - Left ventricle: The cavity size was normal. Wall thickness was    increased in a pattern of mild LVH. Systolic function was normal.    The estimated ejection fraction was in the range of 55% to 60%.    Wall motion was normal; there were no regional wall motion    abnormalities.    10/11/20 MRI brain 1. No acute or subacute insult. 2. 13 mm pontine mass with features consistent with cavernoma. No evidence of recent hemorrhage.   09/21/20 carotid u/s  Right Carotid: Velocities in the right ICA are consistent with a 1-39%  stenosis.   Left Carotid: Velocities in the left ICA are consistent with a 1-39%  stenosis.   Vertebrals:  Bilateral vertebral arteries demonstrate antegrade flow.  Subclavians: Normal flow hemodynamics were seen in bilateral subclavian               arteries.     ASSESSMENT AND PLAN  85 y.o. year old male here with transient left eye monocular double vision lasting 15 minutes on 09/17/2020.   Dx:  1. Double vision   2. Vision abnormalities   3. Cavernous malformation      PLAN:  LEFT EYE MONOCULAR DOUBLE VISION (15 minutes; history of lens implants) - unlikely brain TIA; could have been transient retinal ischemia or lens issues; follow up with ophthalmology for left eye evaluation  LEFT PONTINE CAVERNOUS MALFORMATION (likely incidental finding; may have been present in retrospect on CT from 2014) - repeat MRI brain w/wo in 6  months to ensure stability  Orders Placed This Encounter  Procedures   MR BRAIN W WO CONTRAST   Return in about 1 year (around 11/05/2021).    Penni Bombard, MD 2/29/7989, 21:19 AM Certified in Neurology, Neurophysiology and Neuroimaging  [facility] [address] [place], [place] [address] [phone]

## 2020-11-05 NOTE — Patient Instructions (Signed)
  LEFT EYE MONOCULAR DOUBLE VISION (15 minutes; history of lens implants) - unlikely brain TIA; could have been transient retinal ischemia or lens issue; follow up with ophthalmology for left eye evaluation  LEFT PONTINE CAVERNOUS MALFORMATION (likely incidental finding; may have been present in retrospect on CT from 2014) - repeat MRI brain w/wo in 6 months to ensure stability

## 2020-11-14 DIAGNOSIS — Z961 Presence of intraocular lens: Secondary | ICD-10-CM | POA: Diagnosis not present

## 2020-11-14 DIAGNOSIS — H0102B Squamous blepharitis left eye, upper and lower eyelids: Secondary | ICD-10-CM | POA: Diagnosis not present

## 2020-11-14 DIAGNOSIS — H532 Diplopia: Secondary | ICD-10-CM | POA: Diagnosis not present

## 2020-11-14 DIAGNOSIS — H0102A Squamous blepharitis right eye, upper and lower eyelids: Secondary | ICD-10-CM | POA: Diagnosis not present

## 2020-11-20 DIAGNOSIS — J1281 Pneumonia due to SARS-associated coronavirus: Secondary | ICD-10-CM | POA: Diagnosis not present

## 2021-01-16 ENCOUNTER — Telehealth: Payer: Self-pay | Admitting: Cardiology

## 2021-01-16 NOTE — Telephone Encounter (Signed)
Called patient left message on personal voice mail to call back. 

## 2021-01-16 NOTE — Telephone Encounter (Signed)
Please return call to [phone] or [phone].

## 2021-01-16 NOTE — Telephone Encounter (Signed)
Patient is returning call.  °

## 2021-01-16 NOTE — Telephone Encounter (Signed)
Spoke with pt, he reports on 01/04/21 he got really hot and my have had a heat stroke, he was under to get his bp to register. Since that time he is having increased palpitations as he did prior to his previous ablations. He refuses to see anyone but dr Stanford Breed, follow up scheduled for 01/30/21 @ 12:20 at the [place] office[place]

## 2021-01-16 NOTE — Telephone Encounter (Signed)
Patient is requesting to speak with Malachy Mood, LPN. He declined stating what the call is regarding. States he will go into detail when he speaks to the nurse.

## 2021-01-25 NOTE — Progress Notes (Signed)
HPI: FU SVT and hypertension. He has a past history of having had ablation for frequent PVCs done at the [facility] clinic in the early 1990s. He had a cardiac catheterization in 2001 showing normal coronary arteries. Repeat monitor 5/17 showed sinus with pvcs and SVT. Echo 7/17 showed normal LV function; grade 1 DD and trace AI. Seen by Dr Caryl Comes and placed on flecanide for atrial tach with improvement in symptoms; seen by Dr Rayann Heman for possible ablation but pt preferred medical therapy. Nuclear study July 2019 showed ejection fraction 46% and normal perfusion.  Echocardiogram July 2019 showed normal LV function.  Exercise treadmill November 2021 that showed no diagnostic ST changes the patient achieved 81% target heart rate.  No arrhythmias noted on flecainide.  Recently seen with double vision. Carotid Dopplers May 2022 showed 1 to 39% bilateral stenosis.  MRI May 2022 showed no acute or subacute insult.  Since last seen, patient's double vision has resolved.  However he had COVID approximately 4 to 6 weeks ago.  Since that time he has noticed increased dyspnea on exertion.  He has significant fatigue and balance issues.  He denies orthopnea, PND, chest pain or syncope.  He feels an occasional skipped but he has not had sustained palpitations.  Current Outpatient Medications  Medication Sig Dispense Refill   celecoxib (CELEBREX) 200 MG capsule Take 200 mg by mouth every other day.     Cholecalciferol (VITAMIN D PO) Take 2,000 Units by mouth daily.     flecainide (TAMBOCOR) 100 MG tablet Take 1 tablet (100 mg total) by mouth 2 (two) times daily. 180 tablet 3   losartan (COZAAR) 100 MG tablet TAKE ONE TABLET DAILY 90 tablet 2   metoprolol succinate (TOPROL-XL) 50 MG 24 hr tablet TAKE ONE TABLET DAILY -DO NOT CRUSH 90 tablet 3   metoprolol tartrate (LOPRESSOR) 50 MG tablet TAKE AS NEEDED -TAKE 1/2 TO 1 TABLET IF HEART RATE GREATER THAN 90 90 tablet 1   polycarbophil (FIBERCON) 625 MG tablet Take  625 mg by mouth daily as needed for mild constipation.     potassium citrate (UROCIT-K) 10 MEQ (1080 MG) SR tablet Take 10 mEq by mouth daily.      No current facility-administered medications for this visit.     Past Medical History:  Diagnosis Date   Aortic sclerosis    MILD PER ECHO AS WELL AS TRACE MITRAL REGURGITATION - PER CARDIOLOGY OFFICE NOTES 06/2012   Arthritis    BPH (benign prostatic hyperplasia)    s/p TURP in July 0000000   Complication of anesthesia    HX OF MINOR ITCHING AFTER ANESTHESIA FOR HIP REPLACEMENT AND FOR COLON SURGERY   EKG abnormalities    INCOMPLETE RIGHT BUNDLE BRANCH BLOCK AND CHRONIC T-WAVE ABNORMALITIES - PER CARDIOLOGY OFFICE NOTES 06/2012   GERD (gastroesophageal reflux disease)    History of paroxysmal supraventricular tachycardia    [name]   Hypertension    Palpitations    History of ventricular ectopy. He is status post radiofrequency ablation at the [facility] in the early 90's. for PVC's   Vertigo, benign positional    [name]    Past Surgical History:  Procedure Laterality Date   ANKLE SURGERY     CARDIAC CATHETERIZATION  2002   NORMAL CORONARY ARTERIES   COLON SURGERY  ? [place] LITHOTRIPSY Right 07/23/2017  Procedure: RIGHT EXTRACORPOREAL SHOCK WAVE LITHOTRIPSY (ESWL);  Surgeon: Alexis Frock, MD;  Location: [facility];  Service: Urology;  Laterality: Right;   HERNIA REPAIR  1990   JOINT REPLACEMENT  ? 2009   LEFT HIP REPLACEMENT   KIDNEY STONE SURGERY     right shoulder rotator cuff repair     TONSILLECTOMY  Age 85   TOTAL HIP ARTHROPLASTY Right 08/04/2013   Procedure: RIGHT TOTAL HIP ARTHROPLASTY ANTERIOR APPROACH;  Surgeon: Mauri Pole, MD;  [facility]: [facility];  Service: Orthopedics;  Laterality: Right;   TOTAL KNEE ARTHROPLASTY Bilateral 09/13/2012   Procedure: TOTAL KNEE BILATERAL;  Surgeon: Mauri Pole, MD;   [facility]: [facility];  Service: Orthopedics;  Laterality: Bilateral;   TRANSURETHRAL RESECTION OF PROSTATE     US ECHOCARDIOGRAPHY  06-20-2009   Est EF 55-60%    Social History   Socioeconomic History   Marital status: Married    Spouse name: Ulis Rias   Number of children: 3   Years of education: Not on file   Highest education level: Bachelor's degree (e.g., BA, AB, BS)  Occupational History   Not on file  Tobacco Use   Smoking status: Never   Smokeless tobacco: Never  Vaping Use   Vaping Use: Never used  Substance and Sexual Activity   Alcohol use: Yes    Comment: Drink 1 glass Wine almost daily   Drug use: No   Sexual activity: Not on file  Other Topics Concern   Not on file  Social History Narrative   Lives with wife   Social Determinants of Health   Financial Resource Strain: Not on file  Food Insecurity: Not on file  Transportation Needs: Not on file  Physical Activity: Not on file  Stress: Not on file  Social Connections: Not on file  Intimate Partner Violence: Not on file    Family History  Problem Relation Age of Onset   Heart attack Mother    Other Neg Hx     ROS: no fevers or chills, productive cough, hemoptysis, dysphasia, odynophagia, melena, hematochezia, dysuria, hematuria, rash, seizure activity, orthopnea, PND, pedal edema, claudication. Remaining systems are negative.  Physical Exam: Well-developed well-nourished in no acute distress.  Skin is warm and dry.  HEENT is normal.  Neck is supple.  Chest is clear to auscultation with normal expansion.  Cardiovascular exam is regular rate and rhythm.  Abdominal exam nontender or distended. No masses palpated. Extremities show no edema. neuro grossly intact  ECG-sinus bradycardia at a rate of 50, first-degree AV block, cannot rule out prior inferior infarct, nonspecific ST changes.  Personally reviewed  A/P  1 history of atrial tachycardia-patient remains in sinus rhythm.  Plan to continue flecainide  at present dose.  Can consider ablation in the future if he has refractory symptoms.  2 hypertension-blood pressure controlled.  Continue present medications.  3 dizziness-symptoms remain chronic.  4 Double vision-resolved.  Follow-up neurology.  5 dyspnea on exertion-this has been present since COVID and there is also fatigue.  We will plan to repeat echocardiogram to assess LV function but may be residual effects from his infection.  Kirk Ruths, MD

## 2021-01-30 ENCOUNTER — Other Ambulatory Visit: Payer: Self-pay

## 2021-01-30 ENCOUNTER — Encounter: Payer: Self-pay | Admitting: Cardiology

## 2021-01-30 ENCOUNTER — Ambulatory Visit (INDEPENDENT_AMBULATORY_CARE_PROVIDER_SITE_OTHER): Payer: Medicare Other | Admitting: Cardiology

## 2021-01-30 VITALS — BP 138/80 | HR 50 | Ht 72.0 in | Wt 235.8 lb

## 2021-01-30 DIAGNOSIS — I10 Essential (primary) hypertension: Secondary | ICD-10-CM

## 2021-01-30 DIAGNOSIS — R0602 Shortness of breath: Secondary | ICD-10-CM

## 2021-01-30 DIAGNOSIS — I471 Supraventricular tachycardia: Secondary | ICD-10-CM

## 2021-01-30 NOTE — Patient Instructions (Signed)
Medication Instructions:  Your physician recommends that you continue on your current medications as directed. Please refer to the Current Medication list given to you today.  *If you need a refill on your cardiac medications before your next appointment, please call your pharmacy*   Lab Work:  -None  If you have labs (blood work) drawn today and your tests are completely normal, you will receive your results only by: [place] (if you have MyChart) OR A paper copy in the mail If you have any lab test that is abnormal or we need to change your treatment, we will call you to review the results.   Testing/Procedures: Your physician has requested that you have an echocardiogram. Echocardiography is a painless test that uses sound waves to create images of your heart. It provides your doctor with information about the size and shape of your heart and how well your heart's chambers and valves are working. This procedure takes approximately one hour. There are no restrictions for this procedure.    Follow-Up: At [facility], you and your health needs are our priority.  As part of our continuing mission to provide you with exceptional heart care, we have created designated Provider Care Teams.  These Care Teams include your primary Cardiologist (physician) and Advanced Practice Providers (APPs -  Physician Assistants and Nurse Practitioners) who all work together to provide you with the care you need, when you need it.  We recommend signing up for the patient portal called "MyChart".  Sign up information is provided on this After Visit Summary.  MyChart is used to connect with patients for Virtual Visits (Telemedicine).  Patients are able to view lab/test results, encounter notes, upcoming appointments, etc.  Non-urgent messages can be sent to your provider as well.   To learn more about what you can do with MyChart, go to [email or web address].    Your next appointment:   6  month(s)  The format for your next appointment:   In Person  Provider:   Dr.  Stanford Breed   Other Instructions -None

## 2021-02-01 ENCOUNTER — Other Ambulatory Visit: Payer: Self-pay | Admitting: Cardiology

## 2021-02-01 DIAGNOSIS — I10 Essential (primary) hypertension: Secondary | ICD-10-CM

## 2021-02-06 DIAGNOSIS — L570 Actinic keratosis: Secondary | ICD-10-CM | POA: Diagnosis not present

## 2021-02-06 DIAGNOSIS — Z85828 Personal history of other malignant neoplasm of skin: Secondary | ICD-10-CM | POA: Diagnosis not present

## 2021-02-06 DIAGNOSIS — D485 Neoplasm of uncertain behavior of skin: Secondary | ICD-10-CM | POA: Diagnosis not present

## 2021-02-06 DIAGNOSIS — L814 Other melanin hyperpigmentation: Secondary | ICD-10-CM | POA: Diagnosis not present

## 2021-02-06 DIAGNOSIS — L820 Inflamed seborrheic keratosis: Secondary | ICD-10-CM | POA: Diagnosis not present

## 2021-02-06 DIAGNOSIS — Z96642 Presence of left artificial hip joint: Secondary | ICD-10-CM | POA: Diagnosis not present

## 2021-02-06 DIAGNOSIS — L821 Other seborrheic keratosis: Secondary | ICD-10-CM | POA: Diagnosis not present

## 2021-02-06 DIAGNOSIS — D1801 Hemangioma of skin and subcutaneous tissue: Secondary | ICD-10-CM | POA: Diagnosis not present

## 2021-02-06 DIAGNOSIS — M7061 Trochanteric bursitis, right hip: Secondary | ICD-10-CM | POA: Diagnosis not present

## 2021-02-18 ENCOUNTER — Encounter: Payer: Self-pay | Admitting: *Deleted

## 2021-02-18 ENCOUNTER — Ambulatory Visit (HOSPITAL_COMMUNITY): Payer: Medicare Other | Attending: Cardiovascular Disease

## 2021-02-18 ENCOUNTER — Encounter (INDEPENDENT_AMBULATORY_CARE_PROVIDER_SITE_OTHER): Payer: Self-pay

## 2021-02-18 DIAGNOSIS — R0602 Shortness of breath: Secondary | ICD-10-CM | POA: Diagnosis not present

## 2021-02-18 LAB — ECHOCARDIOGRAM COMPLETE
Area-P 1/2: 3.31 cm2
P 1/2 time: 885 msec
S' Lateral: 3.4 cm

## 2021-03-06 DIAGNOSIS — Z23 Encounter for immunization: Secondary | ICD-10-CM | POA: Diagnosis not present

## 2021-03-13 DIAGNOSIS — Z96641 Presence of right artificial hip joint: Secondary | ICD-10-CM | POA: Diagnosis not present

## 2021-03-13 DIAGNOSIS — M7061 Trochanteric bursitis, right hip: Secondary | ICD-10-CM | POA: Diagnosis not present

## 2021-04-29 ENCOUNTER — Telehealth: Payer: Self-pay | Admitting: Diagnostic Neuroimaging

## 2021-04-29 NOTE — Telephone Encounter (Signed)
Patient left a voicemail on my phone stating that he is schedule to have his MRI done at [facility] on 05/03/21, but he stated he had one done in May and wants to know if he needs to repeat it?

## 2021-04-29 NOTE — Telephone Encounter (Signed)
Noted I informed the patient Per Dr. Leta Baptist wanted him to repeat the MRI within 6 months to check stability.

## 2021-05-03 ENCOUNTER — Ambulatory Visit
Admission: RE | Admit: 2021-05-03 | Discharge: 2021-05-03 | Disposition: A | Discharge status: Home / Self Care | Payer: Medicare Other | Source: Ambulatory Visit | Attending: Diagnostic Neuroimaging | Admitting: Diagnostic Neuroimaging

## 2021-05-03 DIAGNOSIS — I6782 Cerebral ischemia: Secondary | ICD-10-CM | POA: Diagnosis not present

## 2021-05-03 DIAGNOSIS — J341 Cyst and mucocele of nose and nasal sinus: Secondary | ICD-10-CM | POA: Diagnosis not present

## 2021-05-03 DIAGNOSIS — G9389 Other specified disorders of brain: Secondary | ICD-10-CM | POA: Diagnosis not present

## 2021-05-03 DIAGNOSIS — Q283 Other malformations of cerebral vessels: Secondary | ICD-10-CM

## 2021-05-03 DIAGNOSIS — I6381 Other cerebral infarction due to occlusion or stenosis of small artery: Secondary | ICD-10-CM | POA: Diagnosis not present

## 2021-05-03 IMAGING — MR MR HEAD WO/W CM
13 series · 48 of 48 positions shown · IV contrast (Multihance)
Comparison: Brain MRI [DATE].

CLINICAL DATA: Provided history: Cavernous malformation.

EXAM:
MRI HEAD WITHOUT AND WITH CONTRAST
TECHNIQUE: Multiplanar, multiecho pulse sequences of the brain and surrounding
structures were obtained without and with intravenous contrast.
CONTRAST:  20mL MULTIHANCE GADOBENATE DIMEGLUMINE 529 MG/ML IV SOLN

[Series 2: T1 · sagittal · 5.0mm · 0.49mm/px · 2 of 27 slices shown]
[im 1/27]
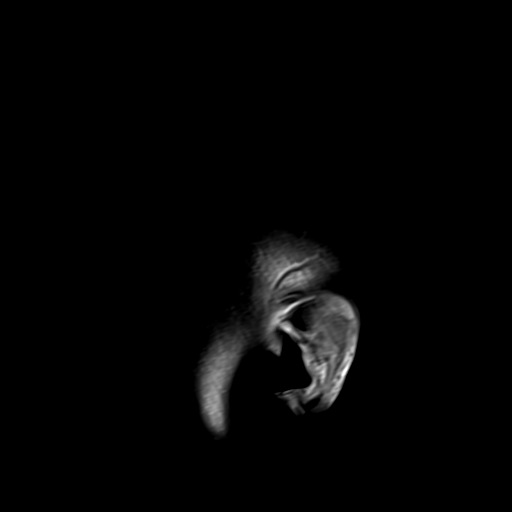
[im 27/27]
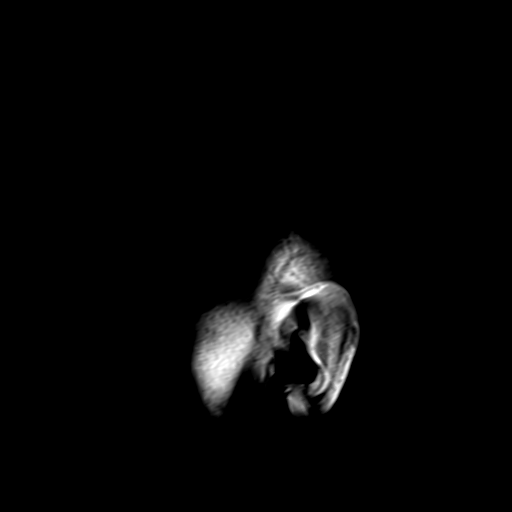

[Series 3: ax ep2d_diff_3 · axial · 3.0mm · 1.88mm/px · z∈[-54,+111]mm · 6 of 112 slices shown]
[im 1/112]
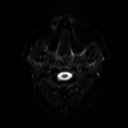
[im 23/112]
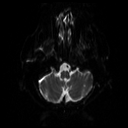
[im 45/112]
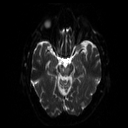
[im 67/112]
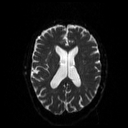
[im 89/112]
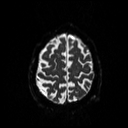
[im 112/112]
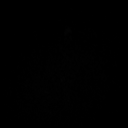

[Series 4: ax ep2d_diff_3_adc · axial · 3.0mm · 1.88mm/px · z∈[-54,+111]mm · 3 of 56 slices shown]
[im 1/56]
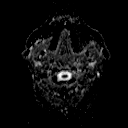
[im 28/56]
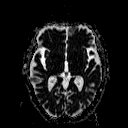
[im 56/56]
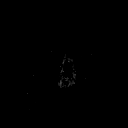

[Series 5: cor ep2d_diff · coronal · 5.0mm · 1.77mm/px · 3 of 62 slices shown]
[im 1/62]
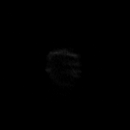
[im 31/62]
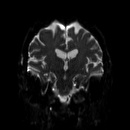
[im 62/62]
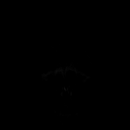

[Series 6: cor ep2d_diff_adc · coronal · 5.0mm · 1.77mm/px · 2 of 32 slices shown]
[im 1/32]
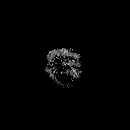
[im 32/32]
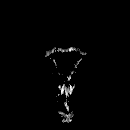

[Series 8: swi_images · axial · 2.0mm · 0.98mm/px · z∈[-57,+114]mm · 5 of 88 slices shown]
[im 1/88]
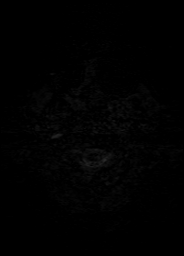
[im 22/88]
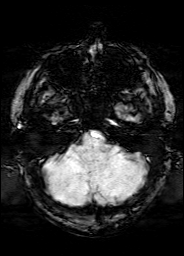
[im 44/88]
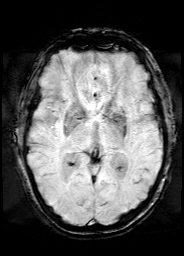
[im 66/88]
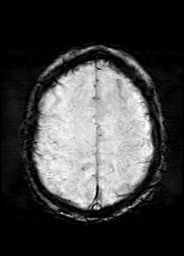
[im 88/88]
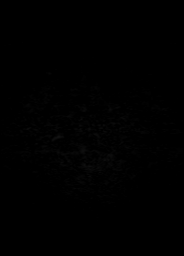

[Series 9: FLAIR · axial · 3.0mm · 0.47mm/px · z∈[-47,+114]mm · 2 of 43 slices shown]
[im 1/43]
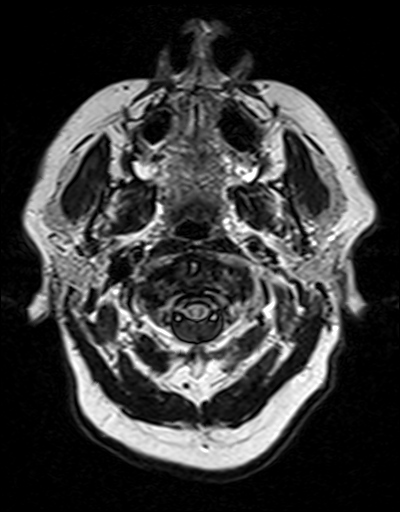
[im 43/43]
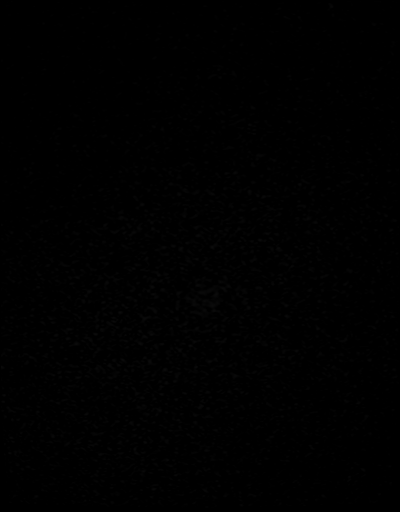

[Series 10: T2 · axial · 5.0mm · 0.65mm/px · z∈[-54,+111]mm · 2 of 29 slices shown (1 of 2)]
[im 1/29]
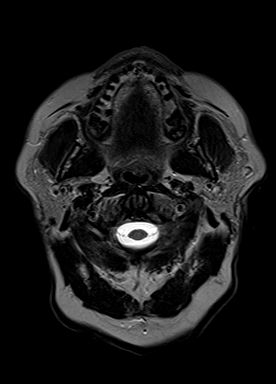
[im 29/29]
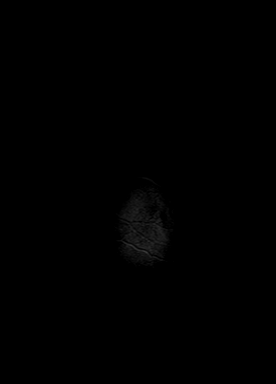

[Series 11: t1_mpr_tra · axial · 1.0mm · 0.78mm/px · z∈[-53,+118]mm · 9 of 176 slices shown]
[im 1/176]
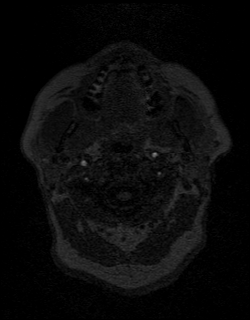
[im 22/176]
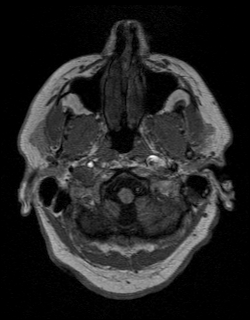
[im 44/176]
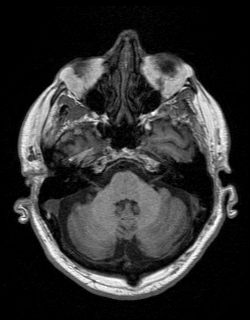
[im 66/176]
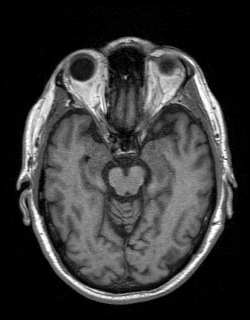
[im 88/176]
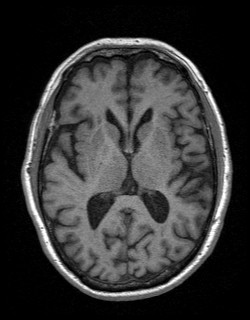
[im 110/176]
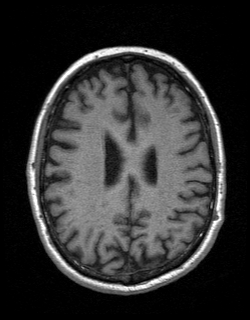
[im 132/176]
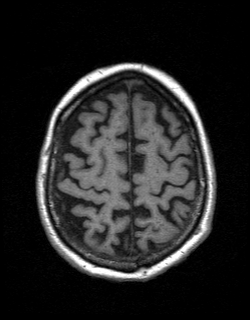
[im 154/176]
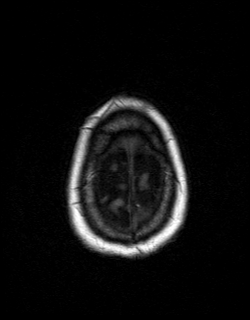
[im 176/176]
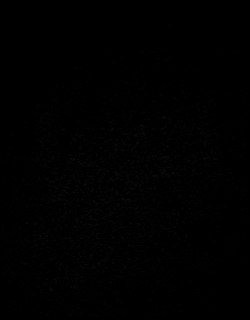

[Series 12: T2 · coronal · 5.0mm · 0.43mm/px · 2 of 34 slices shown (2 of 2)]
[im 1/34]
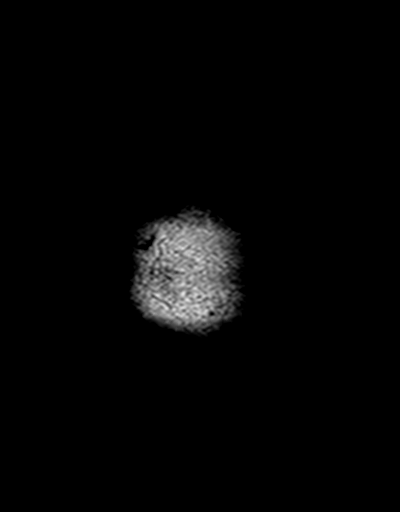
[im 34/34]
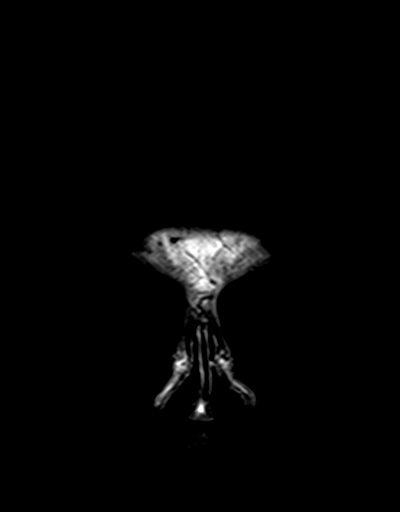

[Series 13: post t1_mpr_tra · axial · 1.0mm · 0.78mm/px · z∈[-53,+118]mm · 9 of 176 slices shown]
[im 1/176]
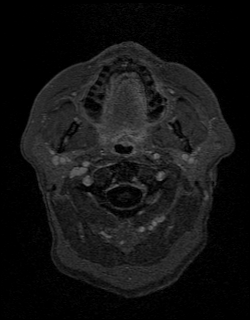
[im 22/176]
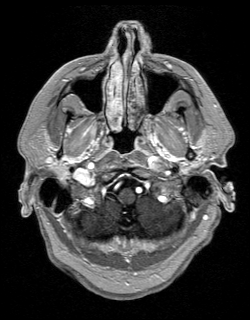
[im 44/176]
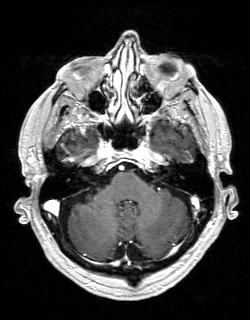
[im 66/176]
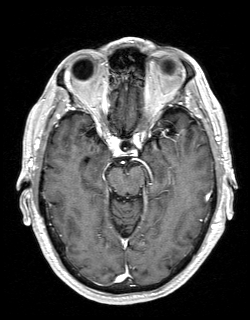
[im 88/176]
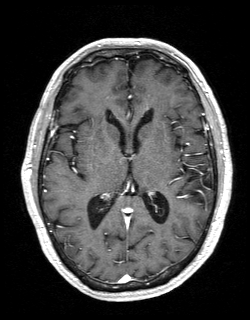
[im 110/176]
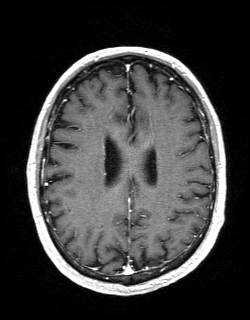
[im 132/176]
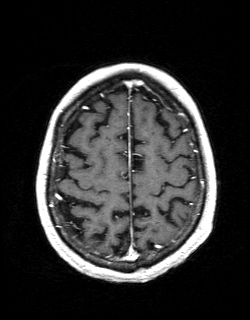
[im 154/176]
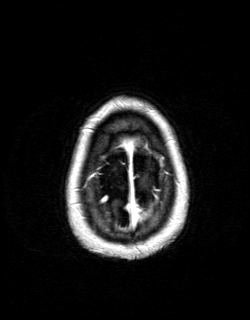
[im 176/176]
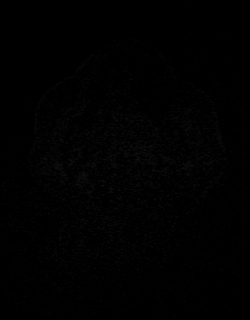

[Series 14: T1 post-contrast · coronal · 5.0mm · 0.72mm/px · 2 of 34 slices shown]
[im 1/34]
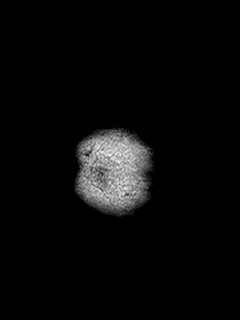
[im 34/34]
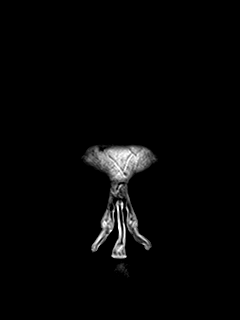

[Series 15: t1_se_sag post · sagittal · 5.0mm · 0.49mm/px · 1 of 27 slices shown]
[im 1/27]
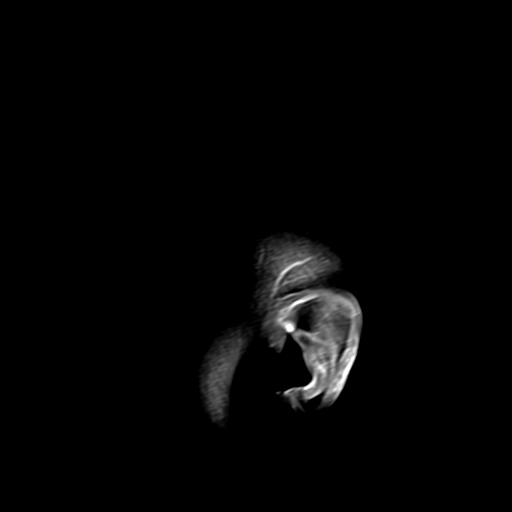

[48 of 48 positions shown; findings below may reference images not displayed]

FINDINGS: Brain:

Cerebral volume appears normal for age.

A 13 mm mass within the left ventral pons has remained stable in
size and appearance since the brain MRI of [DATE]. As before,
the mass demonstrates heterogeneous signal on T2 weighted imaging,
predominantly T1 hypointense signal, associated chronic blood
products and subtle ill-defined internal enhancement. There is no
surrounding parenchymal edema. No evidence of recent hemorrhage.

Redemonstrated small chronic lacunar infarct within the left
lentiform nucleus.

Minimal multifocal T2 FLAIR hyperintense signal abnormality within
the cerebral white matter, nonspecific but compatible with chronic
small vessel ischemic disease.

Redemonstrated small chronic infarcts within the left cerebellar
hemisphere.

There is no acute infarct.

No extra-axial fluid collection.

No midline shift.

Vascular: Maintained flow voids within the proximal large arterial
vessels.

Skull and upper cervical spine: No focal suspicious marrow lesion.
Small right atlantooccipital joint effusion.

Sinuses/Orbits: Visualized orbits show no acute finding. Tiny mucous
retention cyst within the right maxillary sinus. Trace mucosal
thickening within the bilateral ethmoid sinuses.
IMPRESSION: Unchanged MRI appearance of the brain as compared to [DATE].

Stable size and appearance of a 13 mm left pontine mass (imaging
features consistent with cavernoma). No evidence of recent
hemorrhage.

Redemonstrated small chronic infarcts within the left basal ganglia
and left cerebellar hemisphere.

Minimal chronic small-vessel ischemic changes within the cerebral
white matter.

## 2021-05-03 MED ORDER — GADOBENATE DIMEGLUMINE 529 MG/ML IV SOLN
20.0000 mL | Freq: Once | INTRAVENOUS | Status: AC | PRN
  Administered 2021-05-03: 20 mL via INTRAVENOUS

## 2021-05-06 ENCOUNTER — Telehealth: Payer: Self-pay

## 2021-05-06 DIAGNOSIS — Z85828 Personal history of other malignant neoplasm of skin: Secondary | ICD-10-CM | POA: Diagnosis not present

## 2021-05-06 DIAGNOSIS — L309 Dermatitis, unspecified: Secondary | ICD-10-CM | POA: Diagnosis not present

## 2021-05-06 NOTE — Telephone Encounter (Signed)
Contacted pt, informing pt MRI brain was stable with no changes. Advised to call the office back with questions as he had none at this time and was appreciative.

## 2021-05-06 NOTE — Telephone Encounter (Signed)
-----   Message from Penni Bombard, MD sent at 05/06/2021  1:36 PM EST ----- Stable MRI. No changes. -VRP

## 2021-05-09 ENCOUNTER — Other Ambulatory Visit: Payer: Self-pay | Admitting: Cardiology

## 2021-05-09 DIAGNOSIS — I471 Supraventricular tachycardia, unspecified: Secondary | ICD-10-CM

## 2021-07-15 ENCOUNTER — Other Ambulatory Visit: Payer: Self-pay | Admitting: Cardiology

## 2021-07-25 DIAGNOSIS — M1990 Unspecified osteoarthritis, unspecified site: Secondary | ICD-10-CM | POA: Diagnosis not present

## 2021-07-25 DIAGNOSIS — Z87442 Personal history of urinary calculi: Secondary | ICD-10-CM | POA: Diagnosis not present

## 2021-07-25 DIAGNOSIS — Z8711 Personal history of peptic ulcer disease: Secondary | ICD-10-CM | POA: Diagnosis not present

## 2021-07-25 DIAGNOSIS — I10 Essential (primary) hypertension: Secondary | ICD-10-CM | POA: Diagnosis not present

## 2021-07-25 DIAGNOSIS — Z8669 Personal history of other diseases of the nervous system and sense organs: Secondary | ICD-10-CM | POA: Diagnosis not present

## 2021-07-25 DIAGNOSIS — I471 Supraventricular tachycardia: Secondary | ICD-10-CM | POA: Diagnosis not present

## 2021-07-31 NOTE — Progress Notes (Deleted)
? ? ? ? ?HPI: FU SVT and hypertension. He has a past history of having had ablation for frequent PVCs done at the [facility] clinic in the early 1990s. He had a cardiac catheterization in 2001 showing normal coronary arteries. Repeat monitor 5/17 showed sinus with pvcs and SVT. Echo 7/17 showed normal LV function; grade 1 DD and trace AI. Seen by Dr Caryl Comes and placed on flecanide for atrial tach with improvement in symptoms; seen by Dr Rayann Heman for possible ablation but pt preferred medical therapy. Nuclear study July 2019 showed ejection fraction 46% and normal perfusion.  Echocardiogram July 2019 showed normal LV function.  Exercise treadmill November 2021 that showed no diagnostic ST changes the patient achieved 81% target heart rate.  No arrhythmias noted on flecainide.  Recently seen with double vision. Carotid Dopplers May 2022 showed 1 to 39% bilateral stenosis.  MRI May 2022 showed no acute or subacute insult.  Echocardiogram October 2022 showed normal LV function, moderate left atrial enlargement, mild mitral regurgitation with some "splay" artifact and mild aortic insufficiency.  Since last seen,  ? ?Current Outpatient Medications  ?Medication Sig Dispense Refill  ? celecoxib (CELEBREX) 200 MG capsule Take 200 mg by mouth every other day.    ? Cholecalciferol (VITAMIN D PO) Take 2,000 Units by mouth daily.    ? flecainide (TAMBOCOR) 100 MG tablet TAKE ONE TABLET TWICE DAILY 180 tablet 3  ? losartan (COZAAR) 100 MG tablet TAKE ONE TABLET DAILY 90 tablet 2  ? metoprolol succinate (TOPROL-XL) 25 MG 24 hr tablet TAKE 1+1/2 TABLET EVERY MORNING 135 tablet 0  ? metoprolol succinate (TOPROL-XL) 50 MG 24 hr tablet TAKE ONE TABLET DAILY -DO NOT CRUSH 90 tablet 3  ? metoprolol tartrate (LOPRESSOR) 50 MG tablet TAKE AS NEEDED -TAKE 1/2 TO 1 TABLET IF HEART RATE GREATER THAN 90 90 tablet 1  ? polycarbophil (FIBERCON) 625 MG tablet Take 625 mg by mouth daily as needed for mild constipation.    ? potassium citrate  (UROCIT-K) 10 MEQ (1080 MG) SR tablet Take 10 mEq by mouth daily.     ? ?No current facility-administered medications for this visit.  ? ? ? ?Past Medical History:  ?Diagnosis Date  ? Aortic sclerosis   ? MILD PER ECHO AS WELL AS TRACE MITRAL REGURGITATION - PER CARDIOLOGY OFFICE NOTES 06/2012  ? Arthritis   ? BPH (benign prostatic hyperplasia)   ? s/p TURP in July 2012  ? Complication of anesthesia   ? HX OF MINOR ITCHING AFTER ANESTHESIA FOR HIP REPLACEMENT AND FOR COLON SURGERY  ? EKG abnormalities   ? INCOMPLETE RIGHT BUNDLE BRANCH BLOCK AND CHRONIC T-WAVE ABNORMALITIES - PER CARDIOLOGY OFFICE NOTES 06/2012  ? GERD (gastroesophageal reflux disease)   ? History of paroxysmal supraventricular tachycardia   ? DR. Mare Ferrari IS PT'S CARDIOLOGIST  ? Hypertension   ? Palpitations   ? History of ventricular ectopy. He is status post radiofrequency ablation at the [facility] in the early 90's. for PVC's  ? Vertigo, benign positional   ? [name]  ? ? ?Past Surgical History:  ?Procedure Laterality Date  ? ANKLE SURGERY    ? CARDIAC CATHETERIZATION  2002  ? NORMAL CORONARY ARTERIES  ? COLON SURGERY  ? 1994  ? EXTRACORPOREAL SHOCK WAVE LITHOTRIPSY Right 07/23/2017  ? Procedure: RIGHT EXTRACORPOREAL SHOCK WAVE LITHOTRIPSY (ESWL);  Surgeon: Alexis Frock, MD;  Location: [facility];  Service: Urology;  Laterality: Right;  ?  HERNIA REPAIR  1990  ? JOINT REPLACEMENT  ? 2009  ? LEFT HIP REPLACEMENT  ? KIDNEY STONE SURGERY    ? right shoulder rotator cuff repair    ? TONSILLECTOMY  Age 86  ? TOTAL HIP ARTHROPLASTY Right 08/04/2013  ? Procedure: RIGHT TOTAL HIP ARTHROPLASTY ANTERIOR APPROACH;  Surgeon: Mauri Pole, MD;  Location: [facility];  Service: Orthopedics;  Laterality: Right;  ? TOTAL KNEE ARTHROPLASTY Bilateral 09/13/2012  ? Procedure: TOTAL KNEE BILATERAL;  Surgeon: Mauri Pole, MD;  Location: [facility];  Service: Orthopedics;  Laterality: Bilateral;  ? TRANSURETHRAL  RESECTION OF PROSTATE    ? US ECHOCARDIOGRAPHY  06-20-2009  ? Est EF 55-60%  ? ? ?Social History  ? ?Socioeconomic History  ? Marital status: Married  ?  Spouse name: Ulis Rias  ? Number of children: 3  ? Years of education: Not on file  ? Highest education level: Bachelor's degree (e.g., BA, AB, BS)  ?Occupational History  ? Not on file  ?Tobacco Use  ? Smoking status: Never  ? Smokeless tobacco: Never  ?Vaping Use  ? Vaping Use: Never used  ?Substance and Sexual Activity  ? Alcohol use: Yes  ?  Comment: Drink 1 glass Wine almost daily  ? Drug use: No  ? Sexual activity: Not on file  ?Other Topics Concern  ? Not on file  ?Social History Narrative  ? Lives with wife  ? ?Social Determinants of Health  ? ?Financial Resource Strain: Not on file  ?Food Insecurity: Not on file  ?Transportation Needs: Not on file  ?Physical Activity: Not on file  ?Stress: Not on file  ?Social Connections: Not on file  ?Intimate Partner Violence: Not on file  ? ? ?Family History  ?Problem Relation Age of Onset  ? Heart attack Mother   ? Other Neg Hx   ? ? ?ROS: no fevers or chills, productive cough, hemoptysis, dysphasia, odynophagia, melena, hematochezia, dysuria, hematuria, rash, seizure activity, orthopnea, PND, pedal edema, claudication. Remaining systems are negative. ? ?Physical Exam: ?Well-developed well-nourished in no acute distress.  ?Skin is warm and dry.  ?HEENT is normal.  ?Neck is supple.  ?Chest is clear to auscultation with normal expansion.  ?Cardiovascular exam is regular rate and rhythm.  ?Abdominal exam nontender or distended. No masses palpated. ?Extremities show no edema. ?neuro grossly intact ? ?ECG- personally reviewed ? ?A/P ? ?1 atrial tachycardia-patient remains in sinus rhythm today.  We will continue flecainide.  Can consider referral for ablation in the future if he has refractory symptoms. ? ?2 hypertension-patient's blood pressure is controlled.  Continue present medical regimen. ? ?3 history of dyspnea-this  occurred following COVID infection.  Follow-up echocardiogram showed normal LV function.  We will not pursue further evaluation at this point. ? ?4 dizziness-chronic and unchanged. ? ?Kirk Ruths, MD ? ? ? ?

## 2021-08-05 ENCOUNTER — Encounter: Payer: Self-pay | Admitting: Podiatrist

## 2021-08-05 ENCOUNTER — Other Ambulatory Visit: Payer: Self-pay

## 2021-08-05 ENCOUNTER — Ambulatory Visit (INDEPENDENT_AMBULATORY_CARE_PROVIDER_SITE_OTHER): Payer: Medicare Other | Admitting: Podiatrist

## 2021-08-05 DIAGNOSIS — L600 Ingrowing nail: Secondary | ICD-10-CM

## 2021-08-05 NOTE — Progress Notes (Signed)
?Chief Complaint  ?Patient presents with  ? Nail Problem  ?  Great ingrown toe  ?  ? ?HPI: Patient is 86 y.o. male who presents today for a painful ingrown right great toenail on the lateral side.  Patient states it is painful in shoes and with walking.  He has tried no treatment. ? ?Patient Active Problem List  ? Diagnosis Date Noted  ? Vertigo, benign positional   ? Palpitations   ? Hypertension   ? History of paroxysmal supraventricular tachycardia   ? EKG abnormalities   ? Complication of anesthesia   ? BPH (benign prostatic hyperplasia)   ? Arthritis   ? S/P excision of ganglion cyst 07/21/2016  ? PVC (premature ventricular contraction): History of ablation many years ago 06/15/2015  ? Obese 08/05/2013  ? S/P right THA, AA 08/04/2013  ? Dyspnea on exertion 12/14/2012  ? Unspecified arthropathy, lower leg 10/19/2012  ? Secondary renovascular hypertension, benign 10/19/2012  ? Acute blood loss anemia 10/05/2012  ? Unspecified constipation 10/05/2012  ? GERD (gastroesophageal reflux disease) 10/05/2012  ? Syncope and collapse 09/18/2012  ? Hypotension 09/18/2012  ? Confusion 09/18/2012  ? CKD (chronic kidney disease), stage III ([place]) 09/18/2012  ? Dehydration 09/18/2012  ? Expected blood loss anemia 09/15/2012  ? Overweight (BMI 25.0-29.9) 09/15/2012  ? S/P bilateral TKA 09/13/2012  ? Benign hypertensive heart disease without heart failure 07/15/2012  ? PSVT (paroxysmal supraventricular tachycardia) ([place]) 07/15/2012  ? Osteoarthritis 06/24/2011  ? Dizziness 01/07/2011  ? HTN (hypertension) 01/07/2011  ? ? ?Current Outpatient Medications on File Prior to Visit  ?Medication Sig Dispense Refill  ? celecoxib (CELEBREX) 200 MG capsule Take 200 mg by mouth every other day.    ? Cholecalciferol (VITAMIN D PO) Take 2,000 Units by mouth daily.    ? flecainide (TAMBOCOR) 100 MG tablet TAKE ONE TABLET TWICE DAILY 180 tablet 3  ? losartan (COZAAR) 100 MG tablet TAKE ONE TABLET DAILY 90 tablet 2  ? metoprolol succinate  (TOPROL-XL) 25 MG 24 hr tablet TAKE 1+1/2 TABLET EVERY MORNING 135 tablet 0  ? metoprolol succinate (TOPROL-XL) 50 MG 24 hr tablet TAKE ONE TABLET DAILY -DO NOT CRUSH 90 tablet 3  ? metoprolol tartrate (LOPRESSOR) 50 MG tablet TAKE AS NEEDED -TAKE 1/2 TO 1 TABLET IF HEART RATE GREATER THAN 90 90 tablet 1  ? polycarbophil (FIBERCON) 625 MG tablet Take 625 mg by mouth daily as needed for mild constipation.    ? potassium citrate (UROCIT-K) 10 MEQ (1080 MG) SR tablet Take 10 mEq by mouth daily.     ? ?No current facility-administered medications on file prior to visit.  ? ? ?Allergies  ?Allergen Reactions  ? Hydrocodone-Acetaminophen Other (See Comments)  ?  Confusion  ? Oxycodone Other (See Comments)  ?  Angry and confused  ? Ramipril   ?  Cough  ? ? ?Review of Systems ?No fevers, chills, nausea, muscle aches, no difficulty breathing, no calf pain, no chest pain or shortness of breath. ? ? ?Physical Exam ? ?GENERAL APPEARANCE: Alert, conversant. Appropriately groomed. No acute distress.  ? ?VASCULAR: Pedal pulses faintly palpable DP and PT bilateral.  Capillary refill time is immediate to all digits,  Proximal to distal cooling it warm to warm.  Digital perfusion adequate.  ? ?NEUROLOGIC: sensation is intact to 5.07 monofilament at 5/5 sites bilateral.  Light touch is intact bilateral, vibratory sensation intact bilateral ? ?MUSCULOSKELETAL: acceptable muscle strength, tone and stability bilateral.  No gross boney pedal deformities noted.  No pain, crepitus or limitation noted with foot and ankle range of motion bilateral.  ? ?DERMATOLOGIC: skin is warm, supple, and dry.  No open lesions noted.  No rash, no pre ulcerative lesions.  The right hallux nail is incurvated and is a pincer nail type deformity.  The lateral nail border is deeply incurvated and painful with direct pressure.  No redness, no swelling, no drainage, no active infection is noted. ? ? ? ?Assessment  ? ?  ICD-10-CM   ?1. Ingrown toenail of right foot   L60.0   ?  ? ? ? ?Plan ? ?Discussed examination findings with the patient.  Discussed that because the nail is not infected we can try conservative treatments first including a slant back procedure.  This was formed today with sterile nail nippers without complication.  I discussed that if there is no improvement we could perform a permanent phenol matrixectomy on the lateral nail border however he would need to soak for about 2 weeks and apply antibiotic and a Band-Aid for about the same time.  He will call if there is no improvement and if he is interested in this  procedure.  ?

## 2021-08-05 NOTE — Patient Instructions (Signed)
Ingrown Toenail ?An ingrown toenail occurs when the corner or sides of a toenail grow into the surrounding skin. This causes discomfort and pain. The big toe is most commonly affected, but any of the toes can be affected. If an ingrown toenail is not treated, it can become infected. ?What are the causes? ?This condition may be caused by: ?Wearing shoes that are too small or tight. ?An injury, such as stubbing your toe or having your toe stepped on. ?Improper cutting or care of your toenails. ?Having nail or foot abnormalities that were present from birth (congenital abnormalities), such as having a nail that is too big for your toe. ?What increases the risk? ?The following factors may make you more likely to develop ingrown toenails: ?Age. Nails tend to get thicker with age, so ingrown nails are more common among older people. ?Cutting your toenails incorrectly, such as cutting them very short or cutting them unevenly. ?An ingrown toenail is more likely to get infected if you have: ?Diabetes. ?Blood flow (circulation) problems. ?What are the signs or symptoms? ?Symptoms of an ingrown toenail may include: ?Pain, soreness, or tenderness. ?Redness. ?Swelling. ?Hardening of the skin that surrounds the toenail. ?Signs that an ingrown toenail may be infected include: ?Fluid or pus. ?Symptoms that get worse. ?How is this diagnosed? ?Ingrown toenails may be diagnosed based on: ?Your symptoms and medical history. ?A physical exam. ?Labs or tests. If you have fluid or blood coming from your toenail, a sample may be collected to test for the specific type of bacteria that is causing the infection. ?How is this treated? ?Treatment depends on the severity of your symptoms. You may be able to care for your toenail at home. ?If you have an infection, you may be prescribed antibiotic medicines. ?If you have fluid or pus draining from your toenail, your health care provider may drain it. ?If you have trouble walking, you may be  given crutches to use. ?If you have a severe or infected ingrown toenail, you may need a procedure to remove part or all of the nail. ?Follow these instructions at home: ?Foot care ? ?Check your wound every day for signs of infection, or as often as told by your health care provider. Check for: ?More redness, swelling, or pain. ?More fluid or blood. ?Warmth. ?Pus or a bad smell. ?Do not pick at your toenail or try to remove it yourself. ?Soak your foot in warm, soapy water. Do this for 20 minutes, 3 times a day, or as often as told by your health care provider. This helps to keep your toe clean and your skin soft. ?Wear shoes that fit well and are not too tight. Your health care provider may recommend that you wear open-toed shoes while you heal. ?Trim your toenails regularly and carefully. Cut your toenails straight across to prevent injury to the skin at the corners of the toenail. Do not cut your nails in a curved shape. ?Keep your feet clean and dry to help prevent infection. ?General instructions ?Take over-the-counter and prescription medicines only as told by your health care provider. ?If you were prescribed an antibiotic, take it as told by your health care provider. Do not stop taking the antibiotic even if you start to feel better. ?If your health care provider told you to use crutches to help you move around, use them as instructed. ?Return to your normal activities as told by your health care provider. Ask your health care provider what activities are safe for you. ?Keep   all follow-up visits. This is important. ?Contact a health care provider if: ?You have more redness, swelling, pain, or other symptoms that do not improve with treatment. ?You have fluid, blood, or pus coming from your toenail. ?You have a red streak on your skin that starts at your foot and spreads up your leg. ?You have a fever. ?Summary ?An ingrown toenail occurs when the corner or sides of a toenail grow into the surrounding skin.  This causes discomfort and pain. The big toe is most commonly affected, but any of the toes can be affected. ?If an ingrown toenail is not treated, it can become infected. ?Fluid or pus draining from your toenail is a sign of infection. Your health care provider may need to drain it. You may be given antibiotics to treat the infection. ?Trimming your toenails regularly and properly can help you prevent an ingrown toenail. ?This information is not intended to replace advice given to you by your health care provider. Make sure you discuss any questions you have with your health care provider. ?Document Revised: 09/04/2020 Document Reviewed: 09/04/2020 ?Elsevier Patient Education ? 2022 [employer]vier Inc[employer] ? ?

## 2021-08-07 ENCOUNTER — Ambulatory Visit: Payer: Medicare Other | Admitting: Cardiology

## 2021-08-07 DIAGNOSIS — L929 Granulomatous disorder of the skin and subcutaneous tissue, unspecified: Secondary | ICD-10-CM | POA: Diagnosis not present

## 2021-08-07 DIAGNOSIS — D0462 Carcinoma in situ of skin of left upper limb, including shoulder: Secondary | ICD-10-CM | POA: Diagnosis not present

## 2021-08-07 DIAGNOSIS — L570 Actinic keratosis: Secondary | ICD-10-CM | POA: Diagnosis not present

## 2021-08-07 DIAGNOSIS — L821 Other seborrheic keratosis: Secondary | ICD-10-CM | POA: Diagnosis not present

## 2021-08-07 DIAGNOSIS — C44729 Squamous cell carcinoma of skin of left lower limb, including hip: Secondary | ICD-10-CM | POA: Diagnosis not present

## 2021-08-07 DIAGNOSIS — Z85828 Personal history of other malignant neoplasm of skin: Secondary | ICD-10-CM | POA: Diagnosis not present

## 2021-08-07 DIAGNOSIS — L814 Other melanin hyperpigmentation: Secondary | ICD-10-CM | POA: Diagnosis not present

## 2021-08-28 ENCOUNTER — Other Ambulatory Visit: Payer: Self-pay | Admitting: Cardiology

## 2021-09-09 ENCOUNTER — Telehealth: Payer: Self-pay | Admitting: Cardiology

## 2021-09-09 NOTE — Telephone Encounter (Signed)
?*  STAT* If patient is at the pharmacy, call can be transferred to refill team. ? ? ?1. Which medications need to be refilled? (please list name of each medication and dose if known) metoprolol succinate (TOPROL-XL) 25 MG 24 hr tablet ? ?2. Which pharmacy/location (including street and city if local pharmacy) is medication to be sent to? [place], [place] - [address] ? ?3. Do they need a 30 day or 90 day supply? 90 ? ?Pt completely out, Pharmacist states that they have faxed requests for 10 days now.   ?

## 2021-09-11 DIAGNOSIS — R351 Nocturia: Secondary | ICD-10-CM | POA: Diagnosis not present

## 2021-09-11 DIAGNOSIS — N200 Calculus of kidney: Secondary | ICD-10-CM | POA: Diagnosis not present

## 2021-09-11 DIAGNOSIS — N401 Enlarged prostate with lower urinary tract symptoms: Secondary | ICD-10-CM | POA: Diagnosis not present

## 2021-09-11 DIAGNOSIS — N281 Cyst of kidney, acquired: Secondary | ICD-10-CM | POA: Diagnosis not present

## 2021-09-11 NOTE — Telephone Encounter (Signed)
I spoke to the patient's pharmacy and they are confused by which is the appropriate Metoprolol the patient is supposed to take. In our records for medications we have Metoprolol (Succinate) 25 MG take 1 and 1/2 tabs and Metoprolol (Succinate) 50 MG take 1 tab daily and Metoprolol (Tartrate) 50 MG take 1/2 to 1 tab as needed. Which one is the right one? Please advise  ?

## 2021-09-13 NOTE — Telephone Encounter (Signed)
Unable to reach pt or leave a message  

## 2021-09-17 NOTE — Telephone Encounter (Signed)
Unable to reach pt or leave a message  

## 2021-10-04 ENCOUNTER — Other Ambulatory Visit: Payer: Self-pay | Admitting: Cardiology

## 2021-11-05 ENCOUNTER — Ambulatory Visit: Payer: Medicare Other | Admitting: Diagnostic Neuroimaging

## 2021-11-05 ENCOUNTER — Encounter: Payer: Self-pay | Admitting: Diagnostic Neuroimaging

## 2021-11-07 ENCOUNTER — Ambulatory Visit (INDEPENDENT_AMBULATORY_CARE_PROVIDER_SITE_OTHER): Payer: Medicare Other | Admitting: Podiatry

## 2021-11-07 DIAGNOSIS — L600 Ingrowing nail: Secondary | ICD-10-CM | POA: Diagnosis not present

## 2021-11-13 NOTE — Progress Notes (Signed)
Subjective:  Patient ID: [name] [name], male    DOB: 12/21/1935,  MRN: [id number]  Chief Complaint  Patient presents with   Nail Problem    86 y.o. male presents with the above complaint.  Patient presents with continuous pain to the right hallux medial border.  Patient states pain for touch is progressive gotten worse.  Hurts with ambulation.  He would like to have removed he has not seen anyone else prior to seeing me for this.  He denies any other acute complaints he had another ingrown removed in the past.  Pain scale 7 out of 10 no infection noted.   Review of Systems: Negative except as noted in the HPI. Denies N/V/F/Ch.  Past Medical History:  Diagnosis Date   Aortic sclerosis    MILD PER ECHO AS WELL AS TRACE MITRAL REGURGITATION - PER CARDIOLOGY OFFICE NOTES 06/2012   Arthritis    BPH (benign prostatic hyperplasia)    s/p TURP in July 2831   Complication of anesthesia    HX OF MINOR ITCHING AFTER ANESTHESIA FOR HIP REPLACEMENT AND FOR COLON SURGERY   EKG abnormalities    INCOMPLETE RIGHT BUNDLE BRANCH BLOCK AND CHRONIC T-WAVE ABNORMALITIES - PER CARDIOLOGY OFFICE NOTES 06/2012   GERD (gastroesophageal reflux disease)    History of paroxysmal supraventricular tachycardia    [name]   Hypertension    Palpitations    History of ventricular ectopy. He is status post radiofrequency ablation at the [facility] in the early 90's. for PVC's   Vertigo, benign positional    [name]    Current Outpatient Medications:    celecoxib (CELEBREX) 200 MG capsule, Take 200 mg by mouth every other day., Disp: , Rfl:    Cholecalciferol (VITAMIN D PO), Take 2,000 Units by mouth daily., Disp: , Rfl:    flecainide (TAMBOCOR) 100 MG tablet, TAKE ONE TABLET TWICE DAILY, Disp: 180 tablet, Rfl: 3   losartan (COZAAR) 100 MG tablet, TAKE ONE TABLET DAILY, Disp: 90 tablet, Rfl: 2   metoprolol succinate  (TOPROL-XL) 25 MG 24 hr tablet, Take 1 tablet (25 mg total) by mouth daily. Schedule an appointment for further refills, 1st attempt, Disp: 90 tablet, Rfl: 0   metoprolol succinate (TOPROL-XL) 50 MG 24 hr tablet, TAKE ONE TABLET DAILY -DO NOT CRUSH, Disp: 90 tablet, Rfl: 3   metoprolol tartrate (LOPRESSOR) 50 MG tablet, TAKE AS NEEDED -TAKE 1/2 TO 1 TABLET IF HEART RATE GREATER THAN 90, Disp: 90 tablet, Rfl: 2   polycarbophil (FIBERCON) 625 MG tablet, Take 625 mg by mouth daily as needed for mild constipation., Disp: , Rfl:    potassium citrate (UROCIT-K) 10 MEQ (1080 MG) SR tablet, Take 10 mEq by mouth daily. , Disp: , Rfl:   Social History   Tobacco Use  Smoking Status Never  Smokeless Tobacco Never    Allergies  Allergen Reactions   Hydrocodone-Acetaminophen Other (See Comments)    Confusion   Oxycodone Other (See Comments)    Angry and confused   Ramipril     Cough   Objective:  There were no vitals filed for this visit. There is no height or weight on file to calculate BMI. Constitutional Well developed. Well nourished.  Vascular Dorsalis pedis pulses palpable bilaterally. Posterior tibial pulses palpable bilaterally. Capillary refill normal to all digits.  No cyanosis or clubbing noted. Pedal hair growth normal.  Neurologic Normal speech. Oriented to  person, place, and time. Epicritic sensation to light touch grossly present bilaterally.  Dermatologic Painful ingrowing nail at medial nail borders of the hallux nail right. No other open wounds. No skin lesions.  Orthopedic: Normal joint ROM without pain or crepitus bilaterally. No visible deformities. No bony tenderness.   Radiographs: None Assessment:   1. Ingrown toenail of right foot    Plan:  Patient was evaluated and treated and all questions answered.  Ingrown Nail, right -Patient elects to proceed with minor surgery to remove ingrown toenail removal today. Consent reviewed and signed by  patient. -Ingrown nail excised. See procedure note. -Educated on post-procedure care including soaking. Written instructions provided and reviewed. -Patient to follow up in 2 weeks for nail check.  Procedure: Excision of Ingrown Toenail Location: Right 1st toe medial nail borders. Anesthesia: Lidocaine 1% plain; 1.5 mL and Marcaine 0.5% plain; 1.5 mL, digital block. Skin Prep: Betadine. Dressing: Silvadene; telfa; dry, sterile, compression dressing. Technique: Following skin prep, the toe was exsanguinated and a tourniquet was secured at the base of the toe. The affected nail border was freed, split with a nail splitter, and excised. Chemical matrixectomy was then performed with phenol and irrigated out with alcohol. The tourniquet was then removed and sterile dressing applied. Disposition: Patient tolerated procedure well. Patient to return in 2 weeks for follow-up.   No follow-ups on file.

## 2021-11-27 ENCOUNTER — Encounter (HOSPITAL_COMMUNITY): Payer: Self-pay

## 2021-11-27 ENCOUNTER — Emergency Department (HOSPITAL_COMMUNITY)
Admission: EM | Admit: 2021-11-27 | Discharge: 2021-11-27 | Disposition: A | Discharge status: Home / Self Care | Payer: Medicare Other | Attending: Emergency Medicine | Admitting: Emergency Medicine

## 2021-11-27 ENCOUNTER — Other Ambulatory Visit: Payer: Self-pay

## 2021-11-27 DIAGNOSIS — R0689 Other abnormalities of breathing: Secondary | ICD-10-CM | POA: Diagnosis not present

## 2021-11-27 DIAGNOSIS — R944 Abnormal results of kidney function studies: Secondary | ICD-10-CM | POA: Diagnosis not present

## 2021-11-27 DIAGNOSIS — I959 Hypotension, unspecified: Secondary | ICD-10-CM | POA: Diagnosis not present

## 2021-11-27 DIAGNOSIS — R42 Dizziness and giddiness: Secondary | ICD-10-CM | POA: Diagnosis not present

## 2021-11-27 DIAGNOSIS — Z79899 Other long term (current) drug therapy: Secondary | ICD-10-CM | POA: Insufficient documentation

## 2021-11-27 DIAGNOSIS — R55 Syncope and collapse: Secondary | ICD-10-CM | POA: Insufficient documentation

## 2021-11-27 DIAGNOSIS — R0789 Other chest pain: Secondary | ICD-10-CM | POA: Diagnosis not present

## 2021-11-27 DIAGNOSIS — R079 Chest pain, unspecified: Secondary | ICD-10-CM | POA: Diagnosis not present

## 2021-11-27 LAB — CBC WITH DIFFERENTIAL/PLATELET
Abs Immature Granulocytes: 0.03 10*3/uL (ref 0.00–0.07)
Basophils Absolute: 0 10*3/uL (ref 0.0–0.1)
Basophils Relative: 1 %
Eosinophils Absolute: 0.1 10*3/uL (ref 0.0–0.5)
Eosinophils Relative: 1 %
HCT: 44.8 % (ref 39.0–52.0)
Hemoglobin: 15.2 g/dL (ref 13.0–17.0)
Immature Granulocytes: 0 %
Lymphocytes Relative: 19 %
Lymphs Abs: 1.5 10*3/uL (ref 0.7–4.0)
MCH: 30.6 pg (ref 26.0–34.0)
MCHC: 33.9 g/dL (ref 30.0–36.0)
MCV: 90.3 fL (ref 80.0–100.0)
Monocytes Absolute: 0.9 10*3/uL (ref 0.1–1.0)
Monocytes Relative: 12 %
Neutro Abs: 5 10*3/uL (ref 1.7–7.7)
Neutrophils Relative %: 67 %
Platelets: 169 10*3/uL (ref 150–400)
RBC: 4.96 MIL/uL (ref 4.22–5.81)
RDW: 13.1 % (ref 11.5–15.5)
WBC: 7.6 10*3/uL (ref 4.0–10.5)
nRBC: 0 % (ref 0.0–0.2)

## 2021-11-27 LAB — CBG MONITORING, ED: Glucose-Capillary: 126 mg/dL — ABNORMAL HIGH (ref 70–99)

## 2021-11-27 LAB — BASIC METABOLIC PANEL
Anion gap: 7 (ref 5–15)
BUN: 27 mg/dL — ABNORMAL HIGH (ref 8–23)
CO2: 24 mmol/L (ref 22–32)
Calcium: 9 mg/dL (ref 8.9–10.3)
Chloride: 107 mmol/L (ref 98–111)
Creatinine, Ser: 1.56 mg/dL — ABNORMAL HIGH (ref 0.61–1.24)
GFR, Estimated: 44 mL/min — ABNORMAL LOW (ref >60)
Glucose, Bld: 110 mg/dL — ABNORMAL HIGH (ref 70–99)
Potassium: 4.5 mmol/L (ref 3.5–5.1)
Sodium: 138 mmol/L (ref 135–145)

## 2021-11-27 NOTE — Discharge Instructions (Signed)
It appears that you had an episode of low blood pressure causing you to have a near syncopal event.  This is because when there is none of blood flow to the brain.  It does appear you are mildly dehydrated.  To help prevent further symptoms, try to drink an extra liter of water each day, especially when it is hot like today.  Be careful when taking Viagra since it can cause blood pressure to drop when standing up.  Make sure you are eating regularly.  See your doctor for checkup in 1 or 2 weeks.  Return here if needed.

## 2021-11-27 NOTE — ED Provider Notes (Signed)
[facility] Provider Note   CSN: [id number] Arrival date & time: 11/27/21  1519     History  Chief Complaint  Patient presents with   Near Syncope    [name] [name]. is a 86 y.o. male.  HPI He presents for evaluation of dizziness and hypotension after taking a dose of Viagra around 1400 hrs. today.  Transferred by EMS; they measured his blood pressure and it was 96/58.  They did not treat him with any IV fluids.  Patient ate breakfast and lunch today.  He takes metoprolol regularly.  He has previously had orthostatic lightheadedness.  He denies recent illnesses including fever, cough, vomiting, change in bowel or urinary habits and symptoms.    Home Medications Prior to Admission medications   Medication Sig Start Date End Date Taking? Authorizing Provider  celecoxib (CELEBREX) 200 MG capsule Take 200 mg by mouth every other day.    [provider]  Cholecalciferol (VITAMIN D PO) Take 2,000 Units by mouth daily.    [provider]  flecainide (TAMBOCOR) 100 MG tablet TAKE ONE TABLET TWICE DAILY 05/09/21   Lelon Perla, MD  losartan (COZAAR) 100 MG tablet TAKE ONE TABLET DAILY 02/01/21   Lelon Perla, MD  metoprolol succinate (TOPROL-XL) 25 MG 24 hr tablet Take 1 tablet (25 mg total) by mouth daily. Schedule an appointment for further refills, 1st attempt 10/04/21   Lelon Perla, MD  metoprolol succinate (TOPROL-XL) 50 MG 24 hr tablet TAKE ONE TABLET DAILY -DO NOT CRUSH 05/09/21   Lelon Perla, MD  metoprolol tartrate (LOPRESSOR) 50 MG tablet TAKE AS NEEDED -TAKE 1/2 TO 1 TABLET IF HEART RATE GREATER THAN 90 08/28/21   Lelon Perla, MD  polycarbophil (FIBERCON) 625 MG tablet Take 625 mg by mouth daily as needed for mild constipation.    [provider]  potassium citrate (UROCIT-K) 10 MEQ (1080 MG) SR tablet Take 10 mEq by mouth daily.     [provider]      Allergies     Hydrocodone-acetaminophen, Oxycodone, and Ramipril    Review of Systems   Review of Systems  Physical Exam Updated Vital Signs BP 118/63   Pulse (!) 51   Temp 98.6 F (37 C) (Oral)   Resp 17   SpO2 95%  Physical Exam Vitals and nursing note reviewed.  Constitutional:      General: He is not in acute distress.    Appearance: He is well-developed. He is not ill-appearing, toxic-appearing or diaphoretic.  HENT:     Head: Normocephalic and atraumatic.     Right Ear: External ear normal.     Left Ear: External ear normal.  Eyes:     Conjunctiva/sclera: Conjunctivae normal.     Pupils: Pupils are equal, round, and reactive to light.  Neck:     Trachea: Phonation normal.  Cardiovascular:     Rate and Rhythm: Normal rate and regular rhythm.     Heart sounds: Normal heart sounds.  Pulmonary:     Effort: Pulmonary effort is normal. No respiratory distress.     Breath sounds: Normal breath sounds. No stridor.  Abdominal:     General: There is no distension.     Palpations: Abdomen is soft.     Tenderness: There is no abdominal tenderness. There is no guarding.     Hernia: No hernia is present.  Musculoskeletal:        General: Normal range of  motion.     Cervical back: Normal range of motion and neck supple.     Right lower leg: No edema.     Left lower leg: No edema.  Skin:    General: Skin is warm and dry.  Neurological:     Mental Status: He is alert and oriented to person, place, and time.     Cranial Nerves: No cranial nerve deficit.     Sensory: No sensory deficit.     Motor: No abnormal muscle tone.     Coordination: Coordination normal.     Comments: No nystagmus, dysarthria or ataxia.  Psychiatric:        Mood and Affect: Mood normal.        Behavior: Behavior normal.        Thought Content: Thought content normal.        Judgment: Judgment normal.     ED Results / Procedures / Treatments   Labs (all labs ordered are listed, but only abnormal results are  displayed) Labs Reviewed  BASIC METABOLIC PANEL - Abnormal; Notable for the following components:      Result Value   Glucose, Bld 110 (*)    BUN 27 (*)    Creatinine, Ser 1.56 (*)    GFR, Estimated 44 (*)    All other components within normal limits  CBG MONITORING, ED - Abnormal; Notable for the following components:   Glucose-Capillary 126 (*)    All other components within normal limits  CBC WITH DIFFERENTIAL/PLATELET    EKG EKG Interpretation  Date/Time:  Wednesday November 27 2021 15:25:24 EDT Ventricular Rate:  71 PR Interval:  135 QRS Duration: 122 QT Interval:  412 QTC Calculation: 448 R Axis:   -53 Text Interpretation: Sinus rhythm Nonspecific IVCD with LAD Inferior infarct, age indeterminate since last tracing no significant change Confirmed by Daleen Bo [phone]) on 11/27/2021 4:41:04 PM  Radiology No results found.  Procedures Procedures    Medications Ordered in ED Medications - No data to display  ED Course/ Medical Decision Making/ A&P                           Medical Decision Making Patient presenting with signs and symptoms of orthostatic hypotension with documented low blood pressure.  Likely multifactorial including taking Viagra, using metoprolol, and being outside in the heat when this occurred.  He had helped 100% improvement, spontaneously, by the time I saw him.  Amount and/or Complexity of Data Reviewed Independent Historian:     Details: He is a cogent historian Labs: ordered.    Details: Today, metabolic panel -- normal except glucose high, BUN high, creatinine high ECG/medicine tests: ordered and independent interpretation performed.    Details: Cardiac monitor -- normal sinus rhythm  Risk Decision regarding hospitalization. Risk Details: Patient presenting with lightheadedness and near syncope after taking Viagra, walking outside in the heat and taking his usual medications.  ED evaluation indicates mild elevation of creatinine  relative to baseline.  Orthostatic blood pressure and pulse were reassuring.  Patient is clinically well and had improvement of his symptoms, spontaneously.  He does not require hospitalization.  He is instructed to increase his oral fluid, and follow-up with his PCP for further assessment treatment.  Discussed Viagra and how it can lower blood pressure and to be extremely careful when taking it with the constellation of events that occurred today.           Final  Clinical Impression(s) / ED Diagnoses Final diagnoses:  Near syncope    Rx / DC Orders ED Discharge Orders     None         Daleen Bo, MD 11/27/21 1750

## 2021-11-27 NOTE — ED Triage Notes (Signed)
Per EMS patient was going to pick his wife up from home and patient began having spells of dizziness. Patient reports taking a viagra at 1400. Patient found to be hypotensive with bp 96/58 when EMS arrived. Patient ambulatory and alert and oriented on arrival.

## 2021-11-29 ENCOUNTER — Other Ambulatory Visit: Payer: Self-pay

## 2021-11-29 DIAGNOSIS — I10 Essential (primary) hypertension: Secondary | ICD-10-CM

## 2021-11-29 MED ORDER — LOSARTAN POTASSIUM 100 MG PO TABS: 100.0000 mg | ORAL_TABLET | Freq: Every day | ORAL | 2 refills | Status: DC

## 2021-12-03 DIAGNOSIS — I10 Essential (primary) hypertension: Secondary | ICD-10-CM | POA: Diagnosis not present

## 2021-12-03 DIAGNOSIS — I471 Supraventricular tachycardia: Secondary | ICD-10-CM | POA: Diagnosis not present

## 2021-12-03 DIAGNOSIS — Z8711 Personal history of peptic ulcer disease: Secondary | ICD-10-CM | POA: Diagnosis not present

## 2021-12-03 DIAGNOSIS — Z8669 Personal history of other diseases of the nervous system and sense organs: Secondary | ICD-10-CM | POA: Diagnosis not present

## 2021-12-03 DIAGNOSIS — H8110 Benign paroxysmal vertigo, unspecified ear: Secondary | ICD-10-CM | POA: Diagnosis not present

## 2021-12-03 DIAGNOSIS — M1990 Unspecified osteoarthritis, unspecified site: Secondary | ICD-10-CM | POA: Diagnosis not present

## 2021-12-03 DIAGNOSIS — I951 Orthostatic hypotension: Secondary | ICD-10-CM | POA: Diagnosis not present

## 2021-12-03 DIAGNOSIS — Z87442 Personal history of urinary calculi: Secondary | ICD-10-CM | POA: Diagnosis not present

## 2021-12-04 ENCOUNTER — Telehealth: Payer: Self-pay | Admitting: Cardiology

## 2021-12-04 NOTE — Telephone Encounter (Signed)
Spoke with pt regarding an appointment with [name]. Explained that [name] doesn't have openings until late August. Pt scheduled with Almyra Deforest, PA on 12/17/21. Pt would also like an appointment with [name] made for 01/09/22. Pt verbalizes understanding.

## 2021-12-04 NOTE — Telephone Encounter (Signed)
Patient called to get a sooner follow-up ED visit.  Patient is also concerned that he may need a medication change.

## 2021-12-11 NOTE — Progress Notes (Unsigned)
HPI: FU SVT and hypertension. He has a past history of having had ablation for frequent PVCs done at the [facility] clinic in the early 1990s. He had a cardiac catheterization in 2001 showing normal coronary arteries. Repeat monitor 5/17 showed sinus with pvcs and SVT. Seen by Dr Caryl Comes and placed on flecanide for atrial tach with improvement in symptoms; seen by Dr Rayann Heman for possible ablation but pt preferred medical therapy. Nuclear study July 2019 showed ejection fraction 46% and normal perfusion.  Exercise treadmill November 2021 that showed no diagnostic ST changes the patient achieved 81% target heart rate.  No arrhythmias noted on flecainide.  Carotid Dopplers May 2022 showed 1 to 39% bilateral stenosis. Echocardiogram October 2022 showed normal LV function, moderate left atrial enlargement, mild mitral regurgitation with splay artifact, mild aortic insufficiency.  Patient seen in the emergency room July 12 with complaints of dizziness and blood pressure was low.  Since last seen, he denies dyspnea, chest pain or syncope.  He had 1 episode of SVT.  He took an additional Toprol with resolution.  Current Outpatient Medications  Medication Sig Dispense Refill   celecoxib (CELEBREX) 200 MG capsule Take 200 mg by mouth every other day.     Cholecalciferol (VITAMIN D PO) Take 2,000 Units by mouth daily.     flecainide (TAMBOCOR) 100 MG tablet TAKE ONE TABLET TWICE DAILY 180 tablet 3   losartan (COZAAR) 100 MG tablet Take 1 tablet (100 mg total) by mouth daily. 90 tablet 2   metoprolol succinate (TOPROL-XL) 25 MG 24 hr tablet Take 1 tablet (25 mg total) by mouth daily. Schedule an appointment for further refills, 1st attempt 90 tablet 0   metoprolol succinate (TOPROL-XL) 50 MG 24 hr tablet TAKE ONE TABLET DAILY -DO NOT CRUSH 90 tablet 3   metoprolol tartrate (LOPRESSOR) 50 MG tablet TAKE AS NEEDED -TAKE 1/2 TO 1 TABLET IF HEART RATE GREATER THAN 90 90 tablet 2   polycarbophil (FIBERCON) 625 MG  tablet Take 625 mg by mouth daily as needed for mild constipation.     potassium citrate (UROCIT-K) 10 MEQ (1080 MG) SR tablet Take 10 mEq by mouth daily.      No current facility-administered medications for this visit.     Past Medical History:  Diagnosis Date   Aortic sclerosis    MILD PER ECHO AS WELL AS TRACE MITRAL REGURGITATION - PER CARDIOLOGY OFFICE NOTES 06/2012   Arthritis    BPH (benign prostatic hyperplasia)    s/p TURP in July 4540   Complication of anesthesia    HX OF MINOR ITCHING AFTER ANESTHESIA FOR HIP REPLACEMENT AND FOR COLON SURGERY   EKG abnormalities    INCOMPLETE RIGHT BUNDLE BRANCH BLOCK AND CHRONIC T-WAVE ABNORMALITIES - PER CARDIOLOGY OFFICE NOTES 06/2012   GERD (gastroesophageal reflux disease)    History of paroxysmal supraventricular tachycardia    [name]   Hypertension    Palpitations    History of ventricular ectopy. He is status post radiofrequency ablation at the [facility] in the early 90's. for PVC's   Vertigo, benign positional    [name]    Past Surgical History:  Procedure Laterality Date   ANKLE SURGERY     CARDIAC CATHETERIZATION  2002   NORMAL CORONARY ARTERIES   COLON SURGERY  ? [place] LITHOTRIPSY Right 07/23/2017   Procedure: RIGHT EXTRACORPOREAL SHOCK  WAVE LITHOTRIPSY (ESWL);  Surgeon: Alexis Frock, MD;  Location: [facility];  Service: Urology;  Laterality: Right;   HERNIA REPAIR  1990   JOINT REPLACEMENT  ? 2009   LEFT HIP REPLACEMENT   KIDNEY STONE SURGERY     right shoulder rotator cuff repair     TONSILLECTOMY  Age 86   TOTAL HIP ARTHROPLASTY Right 08/04/2013   Procedure: RIGHT TOTAL HIP ARTHROPLASTY ANTERIOR APPROACH;  Surgeon: Mauri Pole, MD;  Location: [facility];  Service: Orthopedics;  Laterality: Right;   TOTAL KNEE ARTHROPLASTY Bilateral 09/13/2012   Procedure: TOTAL KNEE BILATERAL;  Surgeon: Mauri Pole,  MD;  Location: [facility];  Service: Orthopedics;  Laterality: Bilateral;   TRANSURETHRAL RESECTION OF PROSTATE     US ECHOCARDIOGRAPHY  06-20-2009   Est EF 55-60%    Social History   Socioeconomic History   Marital status: Married    Spouse name: Ulis Rias   Number of children: 3   Years of education: Not on file   Highest education level: Bachelor's degree (e.g., BA, AB, BS)  Occupational History   Not on file  Tobacco Use   Smoking status: Never   Smokeless tobacco: Never  Vaping Use   Vaping Use: Never used  Substance and Sexual Activity   Alcohol use: Yes    Comment: Drink 1 glass Wine almost daily   Drug use: No   Sexual activity: Not on file  Other Topics Concern   Not on file  Social History Narrative   Lives with wife   Social Determinants of Health   Financial Resource Strain: Not on file  Food Insecurity: Not on file  Transportation Needs: Not on file  Physical Activity: Not on file  Stress: Not on file  Social Connections: Not on file  Intimate Partner Violence: Not on file    Family History  Problem Relation Age of Onset   Heart attack Mother    Other Neg Hx     ROS: no fevers or chills, productive cough, hemoptysis, dysphasia, odynophagia, melena, hematochezia, dysuria, hematuria, rash, seizure activity, orthopnea, PND, pedal edema, claudication. Remaining systems are negative.  Physical Exam: Well-developed well-nourished in no acute distress.  Skin is warm and dry.  HEENT is normal.  Neck is supple.  Chest is clear to auscultation with normal expansion.  Cardiovascular exam is regular rate and rhythm.  Abdominal exam nontender or distended. No masses palpated. Extremities show no edema. neuro grossly intact  A/P  1 atrial tachycardia-patient is in sinus rhythm and continues to do well on flecainide.  He did have 1 episode that resolved with metoprolol.  If he has more frequent episodes in the future we will consider referral for ablation versus  different antiarrhythmic therapy.    2 hypertension-patient seen recently in the emergency room with hypotension.  His blood pressure has been stable and there was likely a component of dehydration.  I asked him to continue his present medications and increase hydration.  We will follow.  3 History of chronic dizziness-symptoms unchanged.  Kirk Ruths, MD

## 2021-12-11 NOTE — Telephone Encounter (Signed)
Spoke with pt regarding opening in Dr. Jacalyn Lefevre schedule tomorrow. Pt able to make appointment at [place]. Appointment changed for pt. Pt verbalizes understanding.

## 2021-12-12 ENCOUNTER — Ambulatory Visit (INDEPENDENT_AMBULATORY_CARE_PROVIDER_SITE_OTHER): Payer: Medicare Other | Admitting: Cardiology

## 2021-12-12 ENCOUNTER — Encounter: Payer: Self-pay | Admitting: Cardiology

## 2021-12-12 VITALS — BP 124/62 | HR 54 | Ht 72.0 in | Wt 236.8 lb

## 2021-12-12 DIAGNOSIS — I10 Essential (primary) hypertension: Secondary | ICD-10-CM | POA: Diagnosis not present

## 2021-12-12 DIAGNOSIS — I471 Supraventricular tachycardia: Secondary | ICD-10-CM | POA: Diagnosis not present

## 2021-12-12 NOTE — Patient Instructions (Signed)

## 2021-12-17 ENCOUNTER — Ambulatory Visit: Payer: Medicare Other | Admitting: Physician Assistant

## 2022-01-09 ENCOUNTER — Ambulatory Visit: Payer: Medicare Other | Admitting: Cardiology

## 2022-01-14 DIAGNOSIS — R7989 Other specified abnormal findings of blood chemistry: Secondary | ICD-10-CM | POA: Diagnosis not present

## 2022-01-14 DIAGNOSIS — I10 Essential (primary) hypertension: Secondary | ICD-10-CM | POA: Diagnosis not present

## 2022-01-28 DIAGNOSIS — L821 Other seborrheic keratosis: Secondary | ICD-10-CM | POA: Diagnosis not present

## 2022-01-28 DIAGNOSIS — C44729 Squamous cell carcinoma of skin of left lower limb, including hip: Secondary | ICD-10-CM | POA: Diagnosis not present

## 2022-01-28 DIAGNOSIS — Z85828 Personal history of other malignant neoplasm of skin: Secondary | ICD-10-CM | POA: Diagnosis not present

## 2022-01-28 DIAGNOSIS — L570 Actinic keratosis: Secondary | ICD-10-CM | POA: Diagnosis not present

## 2022-01-29 DIAGNOSIS — Z8669 Personal history of other diseases of the nervous system and sense organs: Secondary | ICD-10-CM | POA: Diagnosis not present

## 2022-01-29 DIAGNOSIS — H8110 Benign paroxysmal vertigo, unspecified ear: Secondary | ICD-10-CM | POA: Diagnosis not present

## 2022-01-29 DIAGNOSIS — I471 Supraventricular tachycardia: Secondary | ICD-10-CM | POA: Diagnosis not present

## 2022-01-29 DIAGNOSIS — Z1339 Encounter for screening examination for other mental health and behavioral disorders: Secondary | ICD-10-CM | POA: Diagnosis not present

## 2022-01-29 DIAGNOSIS — Z1331 Encounter for screening for depression: Secondary | ICD-10-CM | POA: Diagnosis not present

## 2022-01-29 DIAGNOSIS — I951 Orthostatic hypotension: Secondary | ICD-10-CM | POA: Diagnosis not present

## 2022-01-29 DIAGNOSIS — Z0000 Encounter for general adult medical examination without abnormal findings: Secondary | ICD-10-CM | POA: Diagnosis not present

## 2022-01-29 DIAGNOSIS — I10 Essential (primary) hypertension: Secondary | ICD-10-CM | POA: Diagnosis not present

## 2022-01-29 DIAGNOSIS — Z8711 Personal history of peptic ulcer disease: Secondary | ICD-10-CM | POA: Diagnosis not present

## 2022-01-29 DIAGNOSIS — Z87442 Personal history of urinary calculi: Secondary | ICD-10-CM | POA: Diagnosis not present

## 2022-01-29 DIAGNOSIS — M1990 Unspecified osteoarthritis, unspecified site: Secondary | ICD-10-CM | POA: Diagnosis not present

## 2022-02-17 DIAGNOSIS — M1990 Unspecified osteoarthritis, unspecified site: Secondary | ICD-10-CM | POA: Diagnosis not present

## 2022-02-18 ENCOUNTER — Other Ambulatory Visit (HOSPITAL_BASED_OUTPATIENT_CLINIC_OR_DEPARTMENT_OTHER): Payer: Self-pay

## 2022-02-18 DIAGNOSIS — Z23 Encounter for immunization: Secondary | ICD-10-CM | POA: Diagnosis not present

## 2022-02-18 MED ORDER — FLUAD QUADRIVALENT 0.5 ML IM PRSY
PREFILLED_SYRINGE | INTRAMUSCULAR | 0 refills | Status: DC
  Filled 2022-02-18: qty 0.5, 1d supply, fill #0

## 2022-03-10 ENCOUNTER — Other Ambulatory Visit: Payer: Self-pay | Admitting: Cardiology

## 2022-03-31 DIAGNOSIS — M25552 Pain in left hip: Secondary | ICD-10-CM | POA: Diagnosis not present

## 2022-05-06 DIAGNOSIS — L570 Actinic keratosis: Secondary | ICD-10-CM | POA: Diagnosis not present

## 2022-05-06 DIAGNOSIS — D692 Other nonthrombocytopenic purpura: Secondary | ICD-10-CM | POA: Diagnosis not present

## 2022-05-06 DIAGNOSIS — D1801 Hemangioma of skin and subcutaneous tissue: Secondary | ICD-10-CM | POA: Diagnosis not present

## 2022-05-06 DIAGNOSIS — L814 Other melanin hyperpigmentation: Secondary | ICD-10-CM | POA: Diagnosis not present

## 2022-05-06 DIAGNOSIS — L821 Other seborrheic keratosis: Secondary | ICD-10-CM | POA: Diagnosis not present

## 2022-05-06 DIAGNOSIS — Z85828 Personal history of other malignant neoplasm of skin: Secondary | ICD-10-CM | POA: Diagnosis not present

## 2022-05-06 DIAGNOSIS — D044 Carcinoma in situ of skin of scalp and neck: Secondary | ICD-10-CM | POA: Diagnosis not present

## 2022-05-26 NOTE — Progress Notes (Signed)
HPI: FU SVT and hypertension. He has a past history of having had ablation for frequent PVCs done at the [facility] clinic in the early 1990s. He had a cardiac catheterization in 2001 showing normal coronary arteries. Repeat monitor 5/17 showed sinus with pvcs and SVT. Seen by Dr Caryl Comes and placed on flecanide for atrial tach with improvement in symptoms; seen by Dr Rayann Heman for possible ablation but pt preferred medical therapy. Nuclear study July 2019 showed ejection fraction 46% and normal perfusion.  Exercise treadmill November 2021 that showed no diagnostic ST changes the patient achieved 81% target heart rate.  No arrhythmias noted on flecainide.  Carotid Dopplers May 2022 showed 1 to 39% bilateral stenosis. Echocardiogram October 2022 showed normal LV function, moderate left atrial enlargement, mild mitral regurgitation with splay artifact, mild aortic insufficiency.  Since last seen, the patient has dyspnea with more extreme activities but not with routine activities. It is relieved with rest. It is not associated with chest pain. There is no orthopnea, PND or pedal edema. There is no syncope or palpitations. There is no exertional chest pain.   Current Outpatient Medications  Medication Sig Dispense Refill   celecoxib (CELEBREX) 200 MG capsule Take 200 mg by mouth every other day.     Cholecalciferol (VITAMIN D PO) Take 2,000 Units by mouth daily.     flecainide (TAMBOCOR) 100 MG tablet TAKE ONE TABLET TWICE DAILY 180 tablet 3   influenza vaccine adjuvanted (FLUAD QUADRIVALENT) 0.5 ML injection Inject into the muscle. 0.5 mL 0   losartan (COZAAR) 100 MG tablet Take 1 tablet (100 mg total) by mouth daily. 90 tablet 2   metoprolol succinate (TOPROL-XL) 25 MG 24 hr tablet TAKE ONE TABLET BY MOUTH DAILY 90 tablet 0   metoprolol succinate (TOPROL-XL) 50 MG 24 hr tablet TAKE ONE TABLET DAILY -DO NOT CRUSH 90 tablet 3   metoprolol tartrate (LOPRESSOR) 50 MG tablet TAKE AS NEEDED -TAKE 1/2 TO 1  TABLET IF HEART RATE GREATER THAN 90 90 tablet 2   polycarbophil (FIBERCON) 625 MG tablet Take 625 mg by mouth daily as needed for mild constipation.     potassium citrate (UROCIT-K) 10 MEQ (1080 MG) SR tablet Take 10 mEq by mouth daily.      No current facility-administered medications for this visit.     Past Medical History:  Diagnosis Date   Aortic sclerosis    MILD PER ECHO AS WELL AS TRACE MITRAL REGURGITATION - PER CARDIOLOGY OFFICE NOTES 06/2012   Arthritis    BPH (benign prostatic hyperplasia)    s/p TURP in July 9326   Complication of anesthesia    HX OF MINOR ITCHING AFTER ANESTHESIA FOR HIP REPLACEMENT AND FOR COLON SURGERY   EKG abnormalities    INCOMPLETE RIGHT BUNDLE BRANCH BLOCK AND CHRONIC T-WAVE ABNORMALITIES - PER CARDIOLOGY OFFICE NOTES 06/2012   GERD (gastroesophageal reflux disease)    History of paroxysmal supraventricular tachycardia    [name]   Hypertension    Palpitations    History of ventricular ectopy. He is status post radiofrequency ablation at the [facility] in the early 90's. for PVC's   Vertigo, benign positional    [name]    Past Surgical History:  Procedure Laterality Date   ANKLE SURGERY     CARDIAC CATHETERIZATION  2002   NORMAL CORONARY ARTERIES   COLON SURGERY  ? 1994   EXTRACORPOREAL  SHOCK WAVE LITHOTRIPSY Right 07/23/2017   Procedure: RIGHT EXTRACORPOREAL SHOCK WAVE LITHOTRIPSY (ESWL);  Surgeon: Alexis Frock, MD;  Location: [facility];  Service: Urology;  Laterality: Right;   HERNIA REPAIR  1990   JOINT REPLACEMENT  ? 2009   LEFT HIP REPLACEMENT   KIDNEY STONE SURGERY     right shoulder rotator cuff repair     TONSILLECTOMY  Age 87   TOTAL HIP ARTHROPLASTY Right 08/04/2013   Procedure: RIGHT TOTAL HIP ARTHROPLASTY ANTERIOR APPROACH;  Surgeon: Mauri Pole, MD;  Location: [facility];  Service: Orthopedics;  Laterality: Right;   TOTAL KNEE  ARTHROPLASTY Bilateral 09/13/2012   Procedure: TOTAL KNEE BILATERAL;  Surgeon: Mauri Pole, MD;  Location: [facility];  Service: Orthopedics;  Laterality: Bilateral;   TRANSURETHRAL RESECTION OF PROSTATE     US ECHOCARDIOGRAPHY  06-20-2009   Est EF 55-60%    Social History   Socioeconomic History   Marital status: Married    Spouse name: Ulis Rias   Number of children: 3   Years of education: Not on file   Highest education level: Bachelor's degree (e.g., BA, AB, BS)  Occupational History   Not on file  Tobacco Use   Smoking status: Never   Smokeless tobacco: Never  Vaping Use   Vaping Use: Never used  Substance and Sexual Activity   Alcohol use: Yes    Comment: Drink 1 glass Wine almost daily   Drug use: No   Sexual activity: Not on file  Other Topics Concern   Not on file  Social History Narrative   Lives with wife   Social Determinants of Health   Financial Resource Strain: Not on file  Food Insecurity: Not on file  Transportation Needs: Not on file  Physical Activity: Not on file  Stress: Not on file  Social Connections: Not on file  Intimate Partner Violence: Not on file    Family History  Problem Relation Age of Onset   Heart attack Mother    Other Neg Hx     ROS: no fevers or chills, productive cough, hemoptysis, dysphasia, odynophagia, melena, hematochezia, dysuria, hematuria, rash, seizure activity, orthopnea, PND, pedal edema, claudication. Remaining systems are negative.  Physical Exam: Well-developed well-nourished in no acute distress.  Skin is warm and dry.  HEENT is normal.  Neck is supple.  Chest is clear to auscultation with normal expansion.  Cardiovascular exam is regular rate and rhythm.  Abdominal exam nontender or distended. No masses palpated. Extremities show no edema. neuro grossly intact  ECG-normal sinus rhythm at a rate of 65, nonspecific ST changes.  Personally reviewed  A/P  1 atrial tachycardia-patient remains in sinus rhythm.   Will continue flecainide at present dose.  Continue metoprolol.  We can consider referral for ablation versus different antiarrhythmic in the future if he has more frequent episodes.  2 hypertension-blood pressure is controlled; however he is having occasions when he feels lightheaded when he stands up which is becoming problematic.  I will discontinue his losartan and follow.  3 chronic dizziness-symptoms are not changed compared to previous.  No plans for further evaluation.  Kirk Ruths, MD

## 2022-06-05 DIAGNOSIS — N401 Enlarged prostate with lower urinary tract symptoms: Secondary | ICD-10-CM | POA: Diagnosis not present

## 2022-06-05 DIAGNOSIS — R351 Nocturia: Secondary | ICD-10-CM | POA: Diagnosis not present

## 2022-06-06 ENCOUNTER — Ambulatory Visit: Payer: Medicare Other | Attending: Cardiology | Admitting: Cardiology

## 2022-06-06 ENCOUNTER — Encounter: Payer: Self-pay | Admitting: Cardiology

## 2022-06-06 VITALS — BP 132/72 | HR 65 | Ht 72.0 in | Wt 234.2 lb

## 2022-06-06 DIAGNOSIS — R42 Dizziness and giddiness: Secondary | ICD-10-CM

## 2022-06-06 DIAGNOSIS — I10 Essential (primary) hypertension: Secondary | ICD-10-CM | POA: Diagnosis not present

## 2022-06-06 DIAGNOSIS — I4719 Other supraventricular tachycardia: Secondary | ICD-10-CM

## 2022-06-06 NOTE — Patient Instructions (Addendum)
STOP LOSARTAN  Follow-Up: At [facility], you and your health needs are our priority.  As part of our continuing mission to provide you with exceptional heart care, we have created designated Provider Care Teams.  These Care Teams include your primary Cardiologist (physician) and Advanced Practice Providers (APPs -  Physician Assistants and Nurse Practitioners) who all work together to provide you with the care you need, when you need it.  We recommend signing up for the patient portal called "MyChart".  Sign up information is provided on this After Visit Summary.  MyChart is used to connect with patients for Virtual Visits (Telemedicine).  Patients are able to view lab/test results, encounter notes, upcoming appointments, etc.  Non-urgent messages can be sent to your provider as well.   To learn more about what you can do with MyChart, go to [email or web address].    Your next appointment:   6 month(s)  Provider:   Kirk Ruths, MD

## 2022-06-24 ENCOUNTER — Other Ambulatory Visit: Payer: Self-pay | Admitting: Cardiology

## 2022-06-24 DIAGNOSIS — I471 Supraventricular tachycardia, unspecified: Secondary | ICD-10-CM

## 2022-07-11 ENCOUNTER — Other Ambulatory Visit: Payer: Self-pay | Admitting: Cardiology

## 2022-07-14 ENCOUNTER — Other Ambulatory Visit: Payer: Self-pay | Admitting: Cardiology

## 2022-07-17 DIAGNOSIS — R351 Nocturia: Secondary | ICD-10-CM | POA: Diagnosis not present

## 2022-07-17 DIAGNOSIS — N401 Enlarged prostate with lower urinary tract symptoms: Secondary | ICD-10-CM | POA: Diagnosis not present

## 2022-07-28 ENCOUNTER — Telehealth: Payer: Self-pay | Admitting: Cardiology

## 2022-07-28 NOTE — Telephone Encounter (Signed)
Left message for pt to call.

## 2022-07-28 NOTE — Telephone Encounter (Signed)
Pt c/o medication issue:  1. Name of Medication: Losartan Potassium   2. How are you currently taking this medication (dosage and times per day)? Not currently taking   3. Are you having a reaction (difficulty breathing--STAT)? No   4. What is your medication issue? Patient states his BP has gone up since stopping this med.   134/76 - after first stopping med  115/66 153/95 169/88 122/74 191/116 - 03/10 morning  184/111 - 03/10 15 mins later  157/100 - 03/11 morning  149/94 -  03/11 - 10 a.m.

## 2022-07-28 NOTE — Telephone Encounter (Signed)
Per chart review, losartan '100mg'$  stopped Jan 2024 d/t lightheadedness upon standing. This has improved since stopping the medication. He reports his BP has trended up since stopping med. He reports some outlier BPs in the 90s up to 198/107.   Med list: metoprolol succinate '25mg'$  (QPM) and '50mg'$  (QAM)  Advised will send to MD to review  134/76 - after first stopping med  143/87 - Jan 24 125/63 - Jan 29 185/97 - Jan 30 -- before metoprolol dose 153/86 - Jan 30 -- about an hour after waking up 115/66 153/95 169/88 122/74 191/116 - March 10 @ 7:15am 184/111 - March 10 5 mins later -- before AM meds 167/98 - March 10 @ 11am 133/80 - March 12:50pm 157/100 - March 11 morning  149/94 -  March 11 @ 10 a.m.

## 2022-07-29 MED ORDER — LOSARTAN POTASSIUM 50 MG PO TABS: 50.0000 mg | ORAL_TABLET | Freq: Every day | ORAL | 3 refills | Status: DC

## 2022-07-29 NOTE — Telephone Encounter (Signed)
Spoke with pt, Aware of dr Jacalyn Lefevre recommendations.  He will cut the 100 mg tablets in 1/2 and will let us know when he needs a new prescription.

## 2022-08-05 DIAGNOSIS — H8110 Benign paroxysmal vertigo, unspecified ear: Secondary | ICD-10-CM | POA: Diagnosis not present

## 2022-08-05 DIAGNOSIS — Z87442 Personal history of urinary calculi: Secondary | ICD-10-CM | POA: Diagnosis not present

## 2022-08-05 DIAGNOSIS — I10 Essential (primary) hypertension: Secondary | ICD-10-CM | POA: Diagnosis not present

## 2022-08-05 DIAGNOSIS — M1990 Unspecified osteoarthritis, unspecified site: Secondary | ICD-10-CM | POA: Diagnosis not present

## 2022-08-05 DIAGNOSIS — Z8669 Personal history of other diseases of the nervous system and sense organs: Secondary | ICD-10-CM | POA: Diagnosis not present

## 2022-08-05 DIAGNOSIS — I471 Supraventricular tachycardia, unspecified: Secondary | ICD-10-CM | POA: Diagnosis not present

## 2022-08-05 DIAGNOSIS — I951 Orthostatic hypotension: Secondary | ICD-10-CM | POA: Diagnosis not present

## 2022-08-05 DIAGNOSIS — Z8711 Personal history of peptic ulcer disease: Secondary | ICD-10-CM | POA: Diagnosis not present

## 2022-10-24 ENCOUNTER — Ambulatory Visit: Payer: Self-pay | Admitting: *Deleted

## 2022-11-04 DIAGNOSIS — L570 Actinic keratosis: Secondary | ICD-10-CM | POA: Diagnosis not present

## 2022-11-04 DIAGNOSIS — Z85828 Personal history of other malignant neoplasm of skin: Secondary | ICD-10-CM | POA: Diagnosis not present

## 2022-11-04 DIAGNOSIS — C44622 Squamous cell carcinoma of skin of right upper limb, including shoulder: Secondary | ICD-10-CM | POA: Diagnosis not present

## 2022-11-04 DIAGNOSIS — L814 Other melanin hyperpigmentation: Secondary | ICD-10-CM | POA: Diagnosis not present

## 2022-11-04 DIAGNOSIS — L821 Other seborrheic keratosis: Secondary | ICD-10-CM | POA: Diagnosis not present

## 2022-11-04 DIAGNOSIS — D225 Melanocytic nevi of trunk: Secondary | ICD-10-CM | POA: Diagnosis not present

## 2022-11-04 DIAGNOSIS — D0461 Carcinoma in situ of skin of right upper limb, including shoulder: Secondary | ICD-10-CM | POA: Diagnosis not present

## 2022-12-18 ENCOUNTER — Ambulatory Visit: Payer: Medicare Other | Attending: Physician Assistant | Admitting: Physician Assistant

## 2022-12-18 ENCOUNTER — Other Ambulatory Visit: Payer: Self-pay | Admitting: Physician Assistant

## 2022-12-18 ENCOUNTER — Encounter: Payer: Self-pay | Admitting: Physician Assistant

## 2022-12-18 VITALS — BP 130/72 | HR 62 | Ht 72.0 in | Wt 240.6 lb

## 2022-12-18 DIAGNOSIS — I471 Supraventricular tachycardia, unspecified: Secondary | ICD-10-CM | POA: Insufficient documentation

## 2022-12-18 DIAGNOSIS — I10 Essential (primary) hypertension: Secondary | ICD-10-CM | POA: Insufficient documentation

## 2022-12-18 NOTE — Patient Instructions (Signed)
Medication Instructions:  No medication changes *If you need a refill on your cardiac medications before your next appointment, please call your pharmacy*   Lab Work: none If you have labs (blood work) drawn today and your tests are completely normal, you will receive your results only by: MyChart Message (if you have MyChart) OR A paper copy in the mail If you have any lab test that is abnormal or we need to change your treatment, we will call you to review the results.   Testing/Procedures: none   Follow-Up: At [facility], you and your health needs are our priority.  As part of our continuing mission to provide you with exceptional heart care, we have created designated Provider Care Teams.  These Care Teams include your primary Cardiologist (physician) and Advanced Practice Providers (APPs -  Physician Assistants and Nurse Practitioners) who all work together to provide you with the care you need, when you need it.  We recommend signing up for the patient portal called "MyChart".  Sign up information is provided on this After Visit Summary.  MyChart is used to connect with patients for Virtual Visits (Telemedicine).  Patients are able to view lab/test results, encounter notes, upcoming appointments, etc.  Non-urgent messages can be sent to your provider as well.   To learn more about what you can do with MyChart, go to [email or web address].    Your next appointment:   6 month(s)  Provider:   Olga Millers, MD     Other Instructions  A letter will be sent to you as a reminder to call the office for your six month follow up with Dr. Jens Som.

## 2022-12-18 NOTE — Progress Notes (Signed)
Cardiology Office Note:  .   Date:  12/18/2022  ID:  [name] [name]., DOB Nov 28, 1935, MRN [id number] PCP: Charlane Ferretti, DO  [facility] Providers Cardiologist:  Olga Millers, MD     History of Present Illness: .   [name] [name]. is a 87 y.o. male with a hx of HTN and SVT.  He had a prior history of ablation for frequent PVCs done at [facility] clinic in early 1990s.  He also had a cardiac catheterization in 2001 that showed normal coronary arteries. Myoview in July 2014 showed EF 59%, no ischemia or wall motion abnormality.  He has chronically abnormal EKG with abnormal T waves.  Heart monitor in May 2017 shows sinus rhythm with PVCs and SVT.  Echocardiogram in July 2017 showed normal LV function, grade 1 DD, trace AI.  He has been seen by Dr. Graciela Husbands and was placed on flecainide for atrial tachycardia with improvement in the symptoms.  He was also seen by Dr. Johney Frame for possible ablation but patient preferred medical therapy.  ETT in October 2017 showed no exercise-induced VT. Myoview obtained in July 2019 showed EF 46%, normal perfusion.  Subsequent echocardiogram in July 2019 showed a normal EF.   Flecainide was increased to 100 mg twice a day in 2021.  Subsequent ETT obtained on 04/17/2020 showed no exercise-induced VT, he was able to achieve a workload of 6.6 METS of activity.  He achieved 81% of the target heart rate.  Carotid Doppler in May 2022 showed 1 to 39% disease bilaterally.  Echocardiogram in October 2022 showed normal EF, moderate LVE, mild MR, mild aortic insufficiency.  Patient was last seen by Dr. Jens Som in January 2024 at which time he had dyspnea with more strenuous activity but not with routine activity.  He had not complained of any significant palpitation.  Due to orthostatic dizziness, losartan has since been reduced to 50 mg daily.  Patient presents today for follow-up.  Overall he has been doing well without chest pain or palpitation.  He says his blood  pressure has been high based on his home blood pressure cuff.  We compared his home blood pressure cuff with our home manual blood pressure cuff in the office.  His blood pressure cuff consistently runs 14 to 15 mmHg higher than the manual blood pressure cuff we use in the office.  I do not think his blood pressure is accurate at home.  Since reducing his losartan, his dizziness has completely resolved.  His blood pressure is running in the 130s.  I recommended continue on the current therapy and follow-up with Dr. Jens Som in 6 months.  ROS:   He denies chest pain, palpitations, dyspnea, pnd, orthopnea, n, v, dizziness, syncope, edema, weight gain, or early satiety. All other systems reviewed and are otherwise negative except as noted above.    Studies Reviewed: .        Cardiac Studies & Procedures     STRESS TESTS  EXERCISE TOLERANCE TEST (ETT) 03/21/2020  Narrative  Exercise time 4 minutes and 40 seconds, decreased exercise effort.  He achieved a workload of 6.6 METS  Complained of fatigue and shortness of breath.  Baseline ECG showed nonspecific ST-T wave changes most notably T wave inversion in the early precordial leads  He achieved 81% of target heart rate, slightly below 85%. Technically nondiagnostic study due to failure to achieve heart rate, however no gross electrocardiographic ischemic changes noted.  There was accentuation of the nonspecific  ST-T wave changes during exercise. No adverse arrhythmias. He is currently on flecainide.  There is mild increase/widening in QRS duration of approximately 40 ms when comparing rest to peak stress/beginning of recovery.  Donato Schultz, MD   ECHOCARDIOGRAM  ECHOCARDIOGRAM COMPLETE 02/18/2021  Narrative ECHOCARDIOGRAM REPORT    Patient Name:   [name] [name]   Date of Exam: 02/18/2021 Medical Rec #:  [id number]     Height:       72.0 in Accession #:    [id number]    Weight:       235.8 lb Date of Birth:  08/02/1935     BSA:           2.285 m Patient Age:    85 years      BP:           138/80 mmHg Patient Gender: M             HR:           53 bpm. Exam Location:  [address]  Procedure: 2D Echo, Cardiac Doppler and Color Doppler  Indications:    R06.00 Dyspnea  History:        Patient has prior history of Echocardiogram examinations, most recent 12/03/2017. Arrythmias:PVC, Signs/Symptoms:Syncope; Risk Factors:Hypertension. Hypotension. Dizziness. Atrial tachycardia. Obesity. Aortic sclerosis. PSVT. Palpitations. Anemia.  Sonographer:    Cathie Beams RCS Referring Phys: [phone] BRIAN S CRENSHAW  IMPRESSIONS   1. Left ventricular ejection fraction, by estimation, is 60 to 65%. The left ventricle has normal function. The left ventricle has no regional wall motion abnormalities. Left ventricular diastolic parameters were normal. 2. Right ventricular systolic function is normal. The right ventricular size is normal. 3. Left atrial size was moderately dilated. 4. Some "splay" artifact with eccentric jet anteriorly consider TEE to further evaluate degree of MR if clinically indicated . The mitral valve is abnormal. Mild mitral valve regurgitation. No evidence of mitral stenosis. 5. The aortic valve is tricuspid. There is mild calcification of the aortic valve. Aortic valve regurgitation is mild. Mild aortic valve sclerosis is present, with no evidence of aortic valve stenosis. 6. The inferior vena cava is normal in size with greater than 50% respiratory variability, suggesting right atrial pressure of 3 mmHg.  FINDINGS Left Ventricle: Left ventricular ejection fraction, by estimation, is 60 to 65%. The left ventricle has normal function. The left ventricle has no regional wall motion abnormalities. The left ventricular internal cavity size was normal in size. There is no left ventricular hypertrophy. Left ventricular diastolic parameters were normal.  Right Ventricle: The right ventricular size is normal. No increase  in right ventricular wall thickness. Right ventricular systolic function is normal.  Left Atrium: Left atrial size was moderately dilated.  Right Atrium: Right atrial size was normal in size.  Pericardium: There is no evidence of pericardial effusion.  Mitral Valve: Some "splay" artifact with eccentric jet anteriorly consider TEE to further evaluate degree of MR if clinically indicated. The mitral valve is abnormal. There is mild thickening of the mitral valve leaflet(s). There is mild calcification of the mitral valve leaflet(s). Mild mitral annular calcification. Mild mitral valve regurgitation. No evidence of mitral valve stenosis.  Tricuspid Valve: The tricuspid valve is normal in structure. Tricuspid valve regurgitation is not demonstrated. No evidence of tricuspid stenosis.  Aortic Valve: The aortic valve is tricuspid. There is mild calcification of the aortic valve. Aortic valve regurgitation is mild. Aortic regurgitation PHT measures 885 msec. Mild aortic valve sclerosis is present,  with no evidence of aortic valve stenosis.  Pulmonic Valve: The pulmonic valve was normal in structure. Pulmonic valve regurgitation is not visualized. No evidence of pulmonic stenosis.  Aorta: The aortic root is normal in size and structure.  Venous: The inferior vena cava is normal in size with greater than 50% respiratory variability, suggesting right atrial pressure of 3 mmHg.  IAS/Shunts: No atrial level shunt detected by color flow Doppler.   LEFT VENTRICLE PLAX 2D LVIDd:         5.60 cm  Diastology LVIDs:         3.40 cm  LV e' medial:    6.32 cm/s LV PW:         1.40 cm  LV E/e' medial:  12.7 LV IVS:        1.10 cm  LV e' lateral:   8.68 cm/s LVOT diam:     2.25 cm  LV E/e' lateral: 9.2 LV SV:         86 LV SV Index:   38 LVOT Area:     3.98 cm   RIGHT VENTRICLE RV Basal diam:  3.00 cm RV S prime:     15.60 cm/s TAPSE (M-mode): 3.0 cm RVSP:           24.3 mmHg  LEFT ATRIUM              Index       RIGHT ATRIUM           Index LA diam:        4.40 cm 1.93 cm/m  RA Pressure: 3.00 mmHg LA Vol (A2C):   92.6 ml 40.53 ml/m RA Area:     18.60 cm LA Vol (A4C):   74.3 ml 32.52 ml/m RA Volume:   44.80 ml  19.61 ml/m LA Biplane Vol: 84.7 ml 37.07 ml/m AORTIC VALVE LVOT Vmax:   93.40 cm/s LVOT Vmean:  58.800 cm/s LVOT VTI:    0.217 m AI PHT:      885 msec  AORTA Ao Root diam: 3.90 cm  MITRAL VALVE               TRICUSPID VALVE MV Area (PHT): 3.31 cm    TR Peak grad:   21.3 mmHg MV Decel Time: 229 msec    TR Vmax:        231.00 cm/s MV E velocity: 80.20 cm/s  Estimated RAP:  3.00 mmHg MV A velocity: 76.50 cm/s  RVSP:           24.3 mmHg MV E/A ratio:  1.05 SHUNTS Systemic VTI:  0.22 m Systemic Diam: 2.25 cm  Charlton Haws MD Electronically signed by Charlton Haws MD Signature Date/Time: 02/18/2021/1:48:25 PM    Final    MONITORS  CARDIAC EVENT MONITOR 10/02/2015  Narrative Sinus with pvcs and SVT (probable atrial flutter)           Risk Assessment/Calculations:            Physical Exam:   VS:  There were no vitals taken for this visit.   Wt Readings from Last 3 Encounters:  06/06/22 234 lb 3.2 oz (106.2 kg)  12/12/21 236 lb 12.8 oz (107.4 kg)  01/30/21 235 lb 12.8 oz (107 kg)    GEN: Well nourished, well developed in no acute distress NECK: No JVD; No carotid bruits CARDIAC: RRR, no murmurs, rubs, gallops RESPIRATORY:  Clear to auscultation without rales, wheezing or rhonchi  ABDOMEN: Soft, non-tender, non-distended EXTREMITIES:  No edema; No deformity   ASSESSMENT AND PLAN: .    History of SVT: Continue metoprolol succinate at 50 mg a.m. and 25 mg p.m.  No significant palpitation recently  Hypertension: Blood pressure well-controlled on current therapy.  His blood pressure is elevated, however his blood pressure cuff when measured with office blood pressure cuff with right 14 to 15 mmHg higher.       Dispo: Follow-up with Dr. Jens Som  in 6 months.  Signed, Azalee Course, PA

## 2022-12-30 ENCOUNTER — Emergency Department (HOSPITAL_BASED_OUTPATIENT_CLINIC_OR_DEPARTMENT_OTHER): Payer: Medicare Other

## 2022-12-30 ENCOUNTER — Emergency Department (HOSPITAL_BASED_OUTPATIENT_CLINIC_OR_DEPARTMENT_OTHER)
Admission: EM | Admit: 2022-12-30 | Discharge: 2022-12-30 | Disposition: A | Discharge status: Home / Self Care | Payer: Medicare Other | Attending: Emergency Medicine | Admitting: Emergency Medicine

## 2022-12-30 ENCOUNTER — Other Ambulatory Visit: Payer: Self-pay

## 2022-12-30 DIAGNOSIS — I10 Essential (primary) hypertension: Secondary | ICD-10-CM | POA: Diagnosis not present

## 2022-12-30 DIAGNOSIS — M25572 Pain in left ankle and joints of left foot: Secondary | ICD-10-CM | POA: Diagnosis not present

## 2022-12-30 DIAGNOSIS — M109 Gout, unspecified: Secondary | ICD-10-CM | POA: Insufficient documentation

## 2022-12-30 DIAGNOSIS — Z79899 Other long term (current) drug therapy: Secondary | ICD-10-CM | POA: Insufficient documentation

## 2022-12-30 DIAGNOSIS — I7090 Unspecified atherosclerosis: Secondary | ICD-10-CM | POA: Diagnosis not present

## 2022-12-30 DIAGNOSIS — M19072 Primary osteoarthritis, left ankle and foot: Secondary | ICD-10-CM | POA: Diagnosis not present

## 2022-12-30 DIAGNOSIS — M7989 Other specified soft tissue disorders: Secondary | ICD-10-CM | POA: Diagnosis not present

## 2022-12-30 MED ORDER — PREDNISONE 20 MG PO TABS: 40.0000 mg | ORAL_TABLET | Freq: Every day | ORAL | 0 refills | Status: DC

## 2022-12-30 NOTE — ED Provider Notes (Signed)
[place] EMERGENCY DEPARTMENT AT [facility] Provider Note   CSN: [id number] Arrival date & time: 12/30/22  1333     History  No chief complaint on file.   [name] [name]. is a 87 y.o. male.  Patient is an 87 year old male with a history of hypertension, BPV, GERD who is presenting today with worsening left ankle pain.  He noticed yesterday it was sore and starting to get a little swollen but it was most painful when he would try to walk on it.  Today it was much worse and is now very painful anytime he tries to walk and much more swollen and now red.  He denies any pain in his calf or up his leg.  No recent falls or overuse injuries that he can think of.  He is not changed any medications in the last month.  He has not had fevers or wounds that he has noted on his ankle.  No history of blood clots.  The history is provided by the patient.       Home Medications Prior to Admission medications   Medication Sig Start Date End Date Taking? Authorizing Provider  predniSONE (DELTASONE) 20 MG tablet Take 2 tablets (40 mg total) by mouth daily. 12/30/22  Yes , Alphonzo Lemmings, MD  celecoxib (CELEBREX) 200 MG capsule Take 200 mg by mouth every other day.    [provider]  Cholecalciferol (VITAMIN D PO) Take 2,000 Units by mouth daily.    [provider]  flecainide (TAMBOCOR) 100 MG tablet TAKE ONE TABLET TWICE DAILY 06/26/22   Lewayne Bunting, MD  GEMTESA 75 MG TABS Take 1 tablet by mouth daily. 11/24/22   [provider]  influenza vaccine adjuvanted (FLUAD QUADRIVALENT) 0.5 ML injection Inject into the muscle. 02/17/22   Judyann Munson, MD  losartan (COZAAR) 50 MG tablet Take 1 tablet (50 mg total) by mouth daily. 07/29/22   Lewayne Bunting, MD  metoprolol succinate (TOPROL-XL) 25 MG 24 hr tablet TAKE ONE TABLET BY MOUTH DAILY 07/14/22   Lewayne Bunting, MD  metoprolol succinate (TOPROL-XL) 50 MG 24 hr tablet Take 1 tablet (50 mg total) by mouth  daily. 07/11/22   Lewayne Bunting, MD  metoprolol tartrate (LOPRESSOR) 50 MG tablet TAKE AS NEEDED -TAKE 1/2 TO 1 TABLET IF HEART RATE GREATER THAN 90 08/28/21   Lewayne Bunting, MD  polycarbophil (FIBERCON) 625 MG tablet Take 625 mg by mouth daily as needed for mild constipation.    [provider]  potassium citrate (UROCIT-K) 10 MEQ (1080 MG) SR tablet Take 10 mEq by mouth daily.     [provider]      Allergies    Hydrocodone-acetaminophen, Oxycodone, and Ramipril    Review of Systems   Review of Systems  Physical Exam Updated Vital Signs BP (!) 188/98   Pulse 74   Temp 98.6 F (37 C) (Oral)   Resp 16   Ht 6' (1.829 m)   Wt 109.1 kg   SpO2 94%   BMI 32.62 kg/m  Physical Exam Vitals and nursing note reviewed.  Constitutional:      General: He is not in acute distress.    Appearance: He is well-developed.  HENT:     Head: Normocephalic and atraumatic.  Eyes:     Conjunctiva/sclera: Conjunctivae normal.     Pupils: Pupils are equal, round, and reactive to light.  Cardiovascular:     Rate and Rhythm: Normal rate and regular  rhythm.     Pulses: Normal pulses.     Heart sounds: No murmur heard. Pulmonary:     Effort: Pulmonary effort is normal. No respiratory distress.  Musculoskeletal:        General: Tenderness present. Normal range of motion.     Cervical back: Normal range of motion and neck supple.     Left ankle: Swelling present. Tenderness present over the medial malleolus. Normal range of motion.     Comments: Mild erythema noted at the ankle and warmth with palpation as well as nonpitting edema.  Less than 2-second capillary refill and 1+ DP pulse noted in the left foot.  Patient is able to plantar and dorsiflex the foot with minimal tenderness.  Skin:    General: Skin is warm and dry.     Findings: No erythema or rash.  Neurological:     Mental Status: He is alert and oriented to person, place, and time.  Psychiatric:        Behavior:  Behavior normal.     ED Results / Procedures / Treatments   Labs (all labs ordered are listed, but only abnormal results are displayed) Labs Reviewed - No data to display  EKG None  Radiology DG Ankle Complete Left  Result Date: 12/30/2022 CLINICAL DATA:  Left ankle pain and swelling, no known injury EXAM: LEFT ANKLE COMPLETE - 3 VIEW COMPARISON:  None Available. FINDINGS: No evidence of acute fracture or malalignment. Diffuse soft tissue swelling is present surrounding the ankle. Small ossicle with well-defined cortication adjacent to the distal fibula likely representing sequelae of remote prior trauma. Chronic calcific tendinosis of the Achilles tendon. Atherosclerotic vascular calcifications present. No ankle joint effusion. Mild midfoot degenerative osteoarthritis. IMPRESSION: 1. No acute fracture or malalignment. 2. Diffuse mild soft tissue swelling about the ankle. 3. Sequelae of remote trauma to the distal fibula. 4. Chronic calcific tendinosis of the Achilles tendon near the insertion. 5. Atherosclerotic vascular calcifications consistent with peripheral arterial disease. 6. Mild midfoot degenerative osteoarthritis. Electronically Signed   By: Malachy Moan M.D.   On: 12/30/2022 15:30    Procedures Procedures    Medications Ordered in ED Medications - No data to display  ED Course/ Medical Decision Making/ A&P                                 Medical Decision Making Risk Prescription drug management.   Pt with multiple medical problems and comorbidities and presenting today with a complaint that caries a high risk for morbidity and mortality.  Here today with swelling and pain in the left ankle that started spontaneously yesterday.  I have independently visualized and interpreted pt's images today.  Foot image today shows no evidence of fracture.  Radiology reports arthritis and soft tissue swelling but no acute findings.  No findings to suggest septic arthritis today.   Could be exacerbation and flare of osteoarthritis versus gout.  Patient's symptoms and abrupt onset of symptoms seems like gout but no prior history.  Patient has no upper leg pain in the calf or thigh to suggest DVT and has not had any trauma.  No findings to suggest abscess or cellulitis.  Discussed this with the patient.  Will treat for suspected gout with diet changes and prednisone.  Encouraged him to follow-up with PCP or orthopedics by the end of the week if he has not improved.  Final Clinical Impression(s) / ED Diagnoses Final diagnoses:  Acute gout of left ankle, unspecified cause    Rx / DC Orders ED Discharge Orders          Ordered    predniSONE (DELTASONE) 20 MG tablet  Daily        12/30/22 1643              Gwyneth Sprout, MD 12/30/22 1744

## 2022-12-30 NOTE — ED Notes (Signed)
Pt d/c home per MD order. Discharge summary reviewed with pt, pt verbalizes understanding. Off unit via WC.

## 2022-12-30 NOTE — ED Triage Notes (Signed)
Pt presents to the er with left ankle swelling onset last night. Pt denies injury to ankle. Pt reports pain to site

## 2022-12-30 NOTE — Discharge Instructions (Addendum)
You can try your Celebrex but you can also take Tylenol arthritis in addition to the steroids.  Elevate when you can to help with the pain and swelling.  If the pain and swelling move up to involve your calf or upper leg you should return for further evaluation and at that time you might need an ultrasound.

## 2023-01-01 DIAGNOSIS — M25572 Pain in left ankle and joints of left foot: Secondary | ICD-10-CM | POA: Diagnosis not present

## 2023-01-01 DIAGNOSIS — I10 Essential (primary) hypertension: Secondary | ICD-10-CM | POA: Diagnosis not present

## 2023-01-01 DIAGNOSIS — M109 Gout, unspecified: Secondary | ICD-10-CM | POA: Diagnosis not present

## 2023-01-07 ENCOUNTER — Telehealth: Payer: Self-pay

## 2023-01-07 NOTE — Telephone Encounter (Signed)
Transition Care Management Follow-up Telephone Call Date of discharge and from where:  12/30/2022 [facility] How have you been since you were released from the hospital? Patient declined to participate.  [name]  [facility] Population Health Community Resource Care Guide   ??[name]  ?? [phone]   Website: [email or web address]  [email or web address]

## 2023-01-27 ENCOUNTER — Telehealth: Payer: Self-pay | Admitting: Cardiology

## 2023-01-27 NOTE — Telephone Encounter (Signed)
Patient wants a call back to confirm his follow-up visit is recommended in 6 months or should he be seen at 3 months.  Patient stated he is not having any medical concern.

## 2023-01-27 NOTE — Telephone Encounter (Signed)
Returned call to pt in regards to his follow. LVM with information about his follow up and his appt day and time. Advised pt to call back with questions.

## 2023-01-28 DIAGNOSIS — N200 Calculus of kidney: Secondary | ICD-10-CM | POA: Diagnosis not present

## 2023-01-28 DIAGNOSIS — R3121 Asymptomatic microscopic hematuria: Secondary | ICD-10-CM | POA: Diagnosis not present

## 2023-01-28 DIAGNOSIS — N281 Cyst of kidney, acquired: Secondary | ICD-10-CM | POA: Diagnosis not present

## 2023-01-28 DIAGNOSIS — R351 Nocturia: Secondary | ICD-10-CM | POA: Diagnosis not present

## 2023-01-28 DIAGNOSIS — L089 Local infection of the skin and subcutaneous tissue, unspecified: Secondary | ICD-10-CM | POA: Diagnosis not present

## 2023-01-28 DIAGNOSIS — N401 Enlarged prostate with lower urinary tract symptoms: Secondary | ICD-10-CM | POA: Diagnosis not present

## 2023-01-28 DIAGNOSIS — I10 Essential (primary) hypertension: Secondary | ICD-10-CM | POA: Diagnosis not present

## 2023-02-02 ENCOUNTER — Telehealth: Payer: Self-pay | Admitting: Cardiology

## 2023-02-02 NOTE — Telephone Encounter (Signed)
Have him come in for EKG about 3 days after starting the Myrbetriq.

## 2023-02-02 NOTE — Telephone Encounter (Signed)
Pt c/o medication issue:  1. Name of Medication: Myrbetriq   2. How are you currently taking this medication (dosage and times per day)? Not currently taking   3. Are you having a reaction (difficulty breathing--STAT)?   4. What is your medication issue? Patient states urologist prescribed, but was told by pharmacist to check with cardiologist due to interaction with  flecainide (TAMBOCOR) 100 MG tablet

## 2023-02-02 NOTE — Telephone Encounter (Signed)
Spoke with pt regarding PharmD's recommendations. Pt states that medication is ready at the pharmacy so he will take first dose tonight. Appointment made for Thursday with APP to have EKG to check for qtc elongation. Pt verbalizes understanding.

## 2023-02-02 NOTE — Telephone Encounter (Signed)
Spoke with pt regarding possible interactions between his flecainide and a new medication they would like to prescribe, myrbetriq. Pt is currently on gemtesa, which doesn't have an interaction with his medications however cost is an issue, pt states the is paying $15 a tablet for gemtesa. Will send to clinical pharmacy team to weigh in.

## 2023-02-05 ENCOUNTER — Encounter: Payer: Self-pay | Admitting: Physician Assistant

## 2023-02-05 ENCOUNTER — Ambulatory Visit: Payer: Medicare Other | Attending: Physician Assistant | Admitting: Physician Assistant

## 2023-02-05 VITALS — BP 148/80 | HR 59 | Ht 72.0 in | Wt 239.0 lb

## 2023-02-05 DIAGNOSIS — Z5181 Encounter for therapeutic drug level monitoring: Secondary | ICD-10-CM | POA: Diagnosis not present

## 2023-02-05 DIAGNOSIS — I10 Essential (primary) hypertension: Secondary | ICD-10-CM | POA: Insufficient documentation

## 2023-02-05 DIAGNOSIS — Z125 Encounter for screening for malignant neoplasm of prostate: Secondary | ICD-10-CM | POA: Diagnosis not present

## 2023-02-05 DIAGNOSIS — I493 Ventricular premature depolarization: Secondary | ICD-10-CM | POA: Insufficient documentation

## 2023-02-05 DIAGNOSIS — Z79899 Other long term (current) drug therapy: Secondary | ICD-10-CM | POA: Diagnosis not present

## 2023-02-05 DIAGNOSIS — R7989 Other specified abnormal findings of blood chemistry: Secondary | ICD-10-CM | POA: Diagnosis not present

## 2023-02-05 NOTE — Progress Notes (Signed)
Cardiology Office Note:  .   Date:  02/05/2023  ID:  [name] [name]., DOB 1936/01/27, MRN [id number] PCP: Charlane Ferretti, DO  [facility] Providers Cardiologist:  Olga Millers, MD     History of Present Illness: .   [name] [name]. is a 87 y.o. male with a hx of HTN and SVT.  He had a prior history of ablation for frequent PVCs done at [facility] clinic in early 1990s.  He also had a cardiac catheterization in 2001 that showed normal coronary arteries. Myoview in July 2014 showed EF 59%, no ischemia or wall motion abnormality.  He has chronically abnormal EKG with abnormal T waves.  Heart monitor in May 2017 shows sinus rhythm with PVCs and SVT.  Echocardiogram in July 2017 showed normal LV function, grade 1 DD, trace AI.  He has been seen by Dr. Graciela Husbands and was placed on flecainide for atrial tachycardia with improvement in the symptoms.  He was also seen by Dr. Johney Frame for possible ablation but patient preferred medical therapy.  ETT in October 2017 showed no exercise-induced VT. Myoview obtained in July 2019 showed EF 46%, normal perfusion.  Subsequent echocardiogram in July 2019 showed a normal EF.   Flecainide was increased to 100 mg twice a day in 2021.  Subsequent ETT obtained on 04/17/2020 showed no exercise-induced VT, he was able to achieve a workload of 6.6 METS of activity.  He achieved 81% of the target heart rate.  Carotid Doppler in May 2022 showed 1 to 39% disease bilaterally.  Echocardiogram in October 2022 showed normal EF, moderate LVE, mild MR, mild aortic insufficiency.  Patient was last seen by Dr. Jens Som in January 2024 at which time he had dyspnea with more strenuous activity but not with routine activity.  He had not complained of any significant palpitation.  Due to orthostatic dizziness, losartan has since been reduced to 50 mg daily.   I saw the patient in August 2024 at which time she was doing well without chest pain or palpitation.  More recently, patient was  started on Myrbetriq by his urologist.  Due to concern of interaction between Myrbetriq and flecainide, he presented today for EKG monitoring.  EKG today shows stable QTc interval.  Patient has not had any recent discomfort.  I recommended a plain old treadmill test to look for QRS widening and VT.  Last ETT was in November 2021.  If plain old treadmill test shows no significant ventricular ectopy, patient can follow-up with Dr. Jens Som as previously scheduled in February 2025  ROS:   He denies chest pain, palpitations, dyspnea, pnd, orthopnea, n, v, dizziness, syncope, edema, weight gain, or early satiety. All other systems reviewed and are otherwise negative except as noted above.    Studies Reviewed: .        Cardiac Studies & Procedures     STRESS TESTS  EXERCISE TOLERANCE TEST (ETT) 03/21/2020  Narrative  Exercise time 4 minutes and 40 seconds, decreased exercise effort.  He achieved a workload of 6.6 METS  Complained of fatigue and shortness of breath.  Baseline ECG showed nonspecific ST-T wave changes most notably T wave inversion in the early precordial leads  He achieved 81% of target heart rate, slightly below 85%. Technically nondiagnostic study due to failure to achieve heart rate, however no gross electrocardiographic ischemic changes noted.  There was accentuation of the nonspecific ST-T wave changes during exercise. No adverse arrhythmias. He is currently on flecainide.  There is  mild increase/widening in QRS duration of approximately 40 ms when comparing rest to peak stress/beginning of recovery.  Donato Schultz, MD   ECHOCARDIOGRAM  ECHOCARDIOGRAM COMPLETE 02/18/2021  Narrative ECHOCARDIOGRAM REPORT    Patient Name:   [name] [name]  Date of Exam: 02/18/2021 Medical Rec #:  [id number]     Height:       72.0 in Accession #:    [id number]    Weight:       235.8 lb Date of Birth:  1935-07-26     BSA:          2.285 m Patient Age:    85 years      BP:            138/80 mmHg Patient Gender: M             HR:           53 bpm. Exam Location:  [address]  Procedure: 2D Echo, Cardiac Doppler and Color Doppler  Indications:    R06.00 Dyspnea  History:        Patient has prior history of Echocardiogram examinations, most recent 12/03/2017. Arrythmias:PVC, Signs/Symptoms:Syncope; Risk Factors:Hypertension. Hypotension. Dizziness. Atrial tachycardia. Obesity. Aortic sclerosis. PSVT. Palpitations. Anemia.  Sonographer:    Cathie Beams RCS Referring Phys: [phone] BRIAN S CRENSHAW  IMPRESSIONS   1. Left ventricular ejection fraction, by estimation, is 60 to 65%. The left ventricle has normal function. The left ventricle has no regional wall motion abnormalities. Left ventricular diastolic parameters were normal. 2. Right ventricular systolic function is normal. The right ventricular size is normal. 3. Left atrial size was moderately dilated. 4. Some "splay" artifact with eccentric jet anteriorly consider TEE to further evaluate degree of MR if clinically indicated . The mitral valve is abnormal. Mild mitral valve regurgitation. No evidence of mitral stenosis. 5. The aortic valve is tricuspid. There is mild calcification of the aortic valve. Aortic valve regurgitation is mild. Mild aortic valve sclerosis is present, with no evidence of aortic valve stenosis. 6. The inferior vena cava is normal in size with greater than 50% respiratory variability, suggesting right atrial pressure of 3 mmHg.  FINDINGS Left Ventricle: Left ventricular ejection fraction, by estimation, is 60 to 65%. The left ventricle has normal function. The left ventricle has no regional wall motion abnormalities. The left ventricular internal cavity size was normal in size. There is no left ventricular hypertrophy. Left ventricular diastolic parameters were normal.  Right Ventricle: The right ventricular size is normal. No increase in right ventricular wall thickness. Right ventricular  systolic function is normal.  Left Atrium: Left atrial size was moderately dilated.  Right Atrium: Right atrial size was normal in size.  Pericardium: There is no evidence of pericardial effusion.  Mitral Valve: Some "splay" artifact with eccentric jet anteriorly consider TEE to further evaluate degree of MR if clinically indicated. The mitral valve is abnormal. There is mild thickening of the mitral valve leaflet(s). There is mild calcification of the mitral valve leaflet(s). Mild mitral annular calcification. Mild mitral valve regurgitation. No evidence of mitral valve stenosis.  Tricuspid Valve: The tricuspid valve is normal in structure. Tricuspid valve regurgitation is not demonstrated. No evidence of tricuspid stenosis.  Aortic Valve: The aortic valve is tricuspid. There is mild calcification of the aortic valve. Aortic valve regurgitation is mild. Aortic regurgitation PHT measures 885 msec. Mild aortic valve sclerosis is present, with no evidence of aortic valve stenosis.  Pulmonic Valve: The pulmonic valve was normal in  structure. Pulmonic valve regurgitation is not visualized. No evidence of pulmonic stenosis.  Aorta: The aortic root is normal in size and structure.  Venous: The inferior vena cava is normal in size with greater than 50% respiratory variability, suggesting right atrial pressure of 3 mmHg.  IAS/Shunts: No atrial level shunt detected by color flow Doppler.   LEFT VENTRICLE PLAX 2D LVIDd:         5.60 cm  Diastology LVIDs:         3.40 cm  LV e' medial:    6.32 cm/s LV PW:         1.40 cm  LV E/e' medial:  12.7 LV IVS:        1.10 cm  LV e' lateral:   8.68 cm/s LVOT diam:     2.25 cm  LV E/e' lateral: 9.2 LV SV:         86 LV SV Index:   38 LVOT Area:     3.98 cm   RIGHT VENTRICLE RV Basal diam:  3.00 cm RV S prime:     15.60 cm/s TAPSE (M-mode): 3.0 cm RVSP:           24.3 mmHg  LEFT ATRIUM             Index       RIGHT ATRIUM           Index LA  diam:        4.40 cm 1.93 cm/m  RA Pressure: 3.00 mmHg LA Vol (A2C):   92.6 ml 40.53 ml/m RA Area:     18.60 cm LA Vol (A4C):   74.3 ml 32.52 ml/m RA Volume:   44.80 ml  19.61 ml/m LA Biplane Vol: 84.7 ml 37.07 ml/m AORTIC VALVE LVOT Vmax:   93.40 cm/s LVOT Vmean:  58.800 cm/s LVOT VTI:    0.217 m AI PHT:      885 msec  AORTA Ao Root diam: 3.90 cm  MITRAL VALVE               TRICUSPID VALVE MV Area (PHT): 3.31 cm    TR Peak grad:   21.3 mmHg MV Decel Time: 229 msec    TR Vmax:        231.00 cm/s MV E velocity: 80.20 cm/s  Estimated RAP:  3.00 mmHg MV A velocity: 76.50 cm/s  RVSP:           24.3 mmHg MV E/A ratio:  1.05 SHUNTS Systemic VTI:  0.22 m Systemic Diam: 2.25 cm  Charlton Haws MD Electronically signed by Charlton Haws MD Signature Date/Time: 02/18/2021/1:48:25 PM    Final    MONITORS  CARDIAC EVENT MONITOR 10/02/2015  Narrative Sinus with pvcs and SVT (probable atrial flutter)           Risk Assessment/Calculations:            Physical Exam:   VS:  BP (!) 148/80 (BP Location: Left Arm, Patient Position: Sitting, Cuff Size: Normal)   Pulse (!) 59   Ht 6' (1.829 m)   Wt 239 lb (108.4 kg)   BMI 32.41 kg/m    Wt Readings from Last 3 Encounters:  02/05/23 239 lb (108.4 kg)  12/30/22 240 lb 8.4 oz (109.1 kg)  12/18/22 240 lb 9.6 oz (109.1 kg)    GEN: Well nourished, well developed in no acute distress NECK: No JVD; No carotid bruits CARDIAC: RRR, no murmurs, rubs, gallops RESPIRATORY:  Clear to auscultation without  rales, wheezing or rhonchi  ABDOMEN: Soft, non-tender, non-distended EXTREMITIES:  No edema; No deformity   ASSESSMENT AND PLAN: .    Encounter for flecainide therapy: Patient was recently started on Myrbetriq by urology service, due to interaction with flecainide therapy, EKG was obtained today, there was no significant QTc prolongation.  He is overdue for treadmill stress test to look for QRS widening and VT while on  flecainide.  Hypertension: Blood pressure well-controlled  History of frequent PVCs: No frequent PVCs recently       Dispo: Follow-up with Dr. Jens Som in February 2025  Signed, Azalee Course, [place]

## 2023-02-05 NOTE — Patient Instructions (Addendum)
Medication Instructions:  NO CHANGES *If you need a refill on your cardiac medications before your next appointment, please call your pharmacy*   Lab Work: NO LABS If you have labs (blood work) drawn today and your tests are completely normal, you will receive your results only by: MyChart Message (if you have MyChart) OR A paper copy in the mail If you have any lab test that is abnormal or we need to change your treatment, we will call you to review the results.   Testing/Procedures: Your physician has requested that you have an exercise tolerance test.  Please also follow instructions as given. This will take place at [address], [address]   Do not drink or eat foods with caffeine for 24 hours before the test. (Chocolate, coffee, tea, or energy drinks) If you use an inhaler, bring it with you to the test. Do not smoke for 4 hours before the test. Wear comfortable shoes and clothing.    Follow-Up: At [facility], you and your health needs are our priority.  As part of our continuing mission to provide you with exceptional heart care, we have created designated Provider Care Teams.  These Care Teams include your primary Cardiologist (physician) and Advanced Practice Providers (APPs -  Physician Assistants and Nurse Practitioners) who all work together to provide you with the care you need, when you need it.  We recommend signing up for the patient portal called "MyChart".  Sign up information is provided on this After Visit Summary.  MyChart is used to connect with patients for Virtual Visits (Telemedicine).  Patients are able to view lab/test results, encounter notes, upcoming appointments, etc.  Non-urgent messages can be sent to your provider as well.   To learn more about what you can do with MyChart, go to [email or web address].    Your next appointment:   KEEP APPOINTMENT FEBRUARY 2025  Provider:   Olga Millers, MD

## 2023-02-12 ENCOUNTER — Telehealth (HOSPITAL_COMMUNITY): Payer: Self-pay | Admitting: *Deleted

## 2023-02-12 NOTE — Telephone Encounter (Signed)
Left message with instructions for upcoming stress test on 02/16/23 at 3:30.

## 2023-02-16 ENCOUNTER — Ambulatory Visit (HOSPITAL_COMMUNITY): Payer: Medicare Other | Attending: Cardiology

## 2023-02-16 DIAGNOSIS — Z79899 Other long term (current) drug therapy: Secondary | ICD-10-CM | POA: Insufficient documentation

## 2023-02-16 DIAGNOSIS — Z5181 Encounter for therapeutic drug level monitoring: Secondary | ICD-10-CM | POA: Diagnosis not present

## 2023-02-16 LAB — EXERCISE TOLERANCE TEST
Angina Index: 0
Duke Treadmill Score: 4
Estimated workload: 5
Exercise duration (min): 3 min
Exercise duration (sec): 44 s
MPHR: 135 {beats}/min
Peak HR: 101 {beats}/min
Percent HR: 74 %
Rest HR: 68 {beats}/min
ST Depression (mm): 0 mm

## 2023-02-19 ENCOUNTER — Telehealth: Payer: Self-pay

## 2023-02-19 NOTE — Telephone Encounter (Addendum)
Called patient regarding results, patient had understanding of results.  ----- Message from [name] [name] sent at 02/17/2023  4:44 PM EDT ----- No significant ventricular ectopy noted during exercise

## 2023-02-23 DIAGNOSIS — Z87442 Personal history of urinary calculi: Secondary | ICD-10-CM | POA: Diagnosis not present

## 2023-02-23 DIAGNOSIS — I10 Essential (primary) hypertension: Secondary | ICD-10-CM | POA: Diagnosis not present

## 2023-02-23 DIAGNOSIS — Z23 Encounter for immunization: Secondary | ICD-10-CM | POA: Diagnosis not present

## 2023-02-23 DIAGNOSIS — Z0000 Encounter for general adult medical examination without abnormal findings: Secondary | ICD-10-CM | POA: Diagnosis not present

## 2023-02-23 DIAGNOSIS — I471 Supraventricular tachycardia, unspecified: Secondary | ICD-10-CM | POA: Diagnosis not present

## 2023-02-23 DIAGNOSIS — H8110 Benign paroxysmal vertigo, unspecified ear: Secondary | ICD-10-CM | POA: Diagnosis not present

## 2023-02-23 DIAGNOSIS — Z8669 Personal history of other diseases of the nervous system and sense organs: Secondary | ICD-10-CM | POA: Diagnosis not present

## 2023-02-23 DIAGNOSIS — Z1331 Encounter for screening for depression: Secondary | ICD-10-CM | POA: Diagnosis not present

## 2023-02-23 DIAGNOSIS — Z8711 Personal history of peptic ulcer disease: Secondary | ICD-10-CM | POA: Diagnosis not present

## 2023-02-23 DIAGNOSIS — M1990 Unspecified osteoarthritis, unspecified site: Secondary | ICD-10-CM | POA: Diagnosis not present

## 2023-02-23 DIAGNOSIS — R82998 Other abnormal findings in urine: Secondary | ICD-10-CM | POA: Diagnosis not present

## 2023-03-31 DIAGNOSIS — R3121 Asymptomatic microscopic hematuria: Secondary | ICD-10-CM | POA: Diagnosis not present

## 2023-03-31 DIAGNOSIS — K5730 Diverticulosis of large intestine without perforation or abscess without bleeding: Secondary | ICD-10-CM | POA: Diagnosis not present

## 2023-03-31 DIAGNOSIS — K449 Diaphragmatic hernia without obstruction or gangrene: Secondary | ICD-10-CM | POA: Diagnosis not present

## 2023-03-31 DIAGNOSIS — N200 Calculus of kidney: Secondary | ICD-10-CM | POA: Diagnosis not present

## 2023-04-14 DIAGNOSIS — R351 Nocturia: Secondary | ICD-10-CM | POA: Diagnosis not present

## 2023-04-14 DIAGNOSIS — N401 Enlarged prostate with lower urinary tract symptoms: Secondary | ICD-10-CM | POA: Diagnosis not present

## 2023-04-14 DIAGNOSIS — R3121 Asymptomatic microscopic hematuria: Secondary | ICD-10-CM | POA: Diagnosis not present

## 2023-04-30 ENCOUNTER — Other Ambulatory Visit: Payer: Self-pay | Admitting: Cardiology

## 2023-04-30 DIAGNOSIS — I10 Essential (primary) hypertension: Secondary | ICD-10-CM

## 2023-05-01 ENCOUNTER — Other Ambulatory Visit: Payer: Self-pay | Admitting: Cardiology

## 2023-05-01 DIAGNOSIS — I10 Essential (primary) hypertension: Secondary | ICD-10-CM

## 2023-05-08 DIAGNOSIS — L814 Other melanin hyperpigmentation: Secondary | ICD-10-CM | POA: Diagnosis not present

## 2023-05-08 DIAGNOSIS — C4442 Squamous cell carcinoma of skin of scalp and neck: Secondary | ICD-10-CM | POA: Diagnosis not present

## 2023-05-08 DIAGNOSIS — L821 Other seborrheic keratosis: Secondary | ICD-10-CM | POA: Diagnosis not present

## 2023-05-08 DIAGNOSIS — Z85828 Personal history of other malignant neoplasm of skin: Secondary | ICD-10-CM | POA: Diagnosis not present

## 2023-05-08 DIAGNOSIS — L570 Actinic keratosis: Secondary | ICD-10-CM | POA: Diagnosis not present

## 2023-05-08 DIAGNOSIS — D485 Neoplasm of uncertain behavior of skin: Secondary | ICD-10-CM | POA: Diagnosis not present

## 2023-05-21 ENCOUNTER — Other Ambulatory Visit: Payer: Self-pay

## 2023-05-21 MED ORDER — LOSARTAN POTASSIUM 50 MG PO TABS: 50.0000 mg | ORAL_TABLET | Freq: Every day | ORAL | 2 refills | Status: DC

## 2023-06-23 NOTE — Progress Notes (Signed)
HPI: FU SVT and hypertension. He has a past history of having had ablation for frequent PVCs done at the [facility] clinic in the early 1990s. He had a cardiac catheterization in 2001 showing normal coronary arteries. Repeat monitor 5/17 showed sinus with pvcs and SVT. Seen by Dr Graciela Husbands and placed on flecanide for atrial tach with improvement in symptoms; seen by Dr Johney Frame for possible ablation but pt preferred medical therapy. Nuclear study July 2019 showed ejection fraction 46% and normal perfusion. Carotid Dopplers May 2022 showed 1 to 39% bilateral stenosis. Echocardiogram October 2022 showed normal LV function, moderate left atrial enlargement, mild mitral regurgitation with splay artifact, mild aortic insufficiency.  Exercise tolerance test September 2024 showed no ST changes but was nondiagnostic as he achieved 74% of predicted maximal heart rate; no exercise-induced VT noted.  Since last seen, the patient denies any dyspnea on exertion, orthopnea, PND, pedal edema, palpitations, syncope or chest pain.   Current Outpatient Medications  Medication Sig Dispense Refill   celecoxib (CELEBREX) 200 MG capsule Take 200 mg by mouth every other day.     Cholecalciferol (VITAMIN D PO) Take 2,000 Units by mouth daily.     flecainide (TAMBOCOR) 100 MG tablet TAKE ONE TABLET TWICE DAILY 180 tablet 3   GEMTESA 75 MG TABS Take 1 tablet by mouth daily.     losartan (COZAAR) 50 MG tablet Take 1 tablet (50 mg total) by mouth daily. 90 tablet 2   metoprolol succinate (TOPROL-XL) 25 MG 24 hr tablet TAKE ONE TABLET BY MOUTH DAILY 90 tablet 3   metoprolol succinate (TOPROL-XL) 50 MG 24 hr tablet Take 1 tablet (50 mg total) by mouth daily. 90 tablet 3   metoprolol tartrate (LOPRESSOR) 50 MG tablet TAKE AS NEEDED -TAKE 1/2 TO 1 TABLET IF HEART RATE GREATER THAN 90 90 tablet 2   polycarbophil (FIBERCON) 625 MG tablet Take 625 mg by mouth daily as needed for mild constipation.     potassium citrate (UROCIT-K) 10  MEQ (1080 MG) SR tablet Take 10 mEq by mouth daily.      influenza vaccine adjuvanted (FLUAD QUADRIVALENT) 0.5 ML injection Inject into the muscle. (Patient not taking: Reported on 07/07/2023) 0.5 mL 0   predniSONE (DELTASONE) 20 MG tablet Take 2 tablets (40 mg total) by mouth daily. (Patient not taking: Reported on 07/07/2023) 10 tablet 0   No current facility-administered medications for this visit.     Past Medical History:  Diagnosis Date   Aortic sclerosis    MILD PER ECHO AS WELL AS TRACE MITRAL REGURGITATION - PER CARDIOLOGY OFFICE NOTES 06/2012   Arthritis    BPH (benign prostatic hyperplasia)    s/p TURP in July 2012   Complication of anesthesia    HX OF MINOR ITCHING AFTER ANESTHESIA FOR HIP REPLACEMENT AND FOR COLON SURGERY   EKG abnormalities    INCOMPLETE RIGHT BUNDLE BRANCH BLOCK AND CHRONIC T-WAVE ABNORMALITIES - PER CARDIOLOGY OFFICE NOTES 06/2012   GERD (gastroesophageal reflux disease)    History of paroxysmal supraventricular tachycardia    [name]   Hypertension    Palpitations    History of ventricular ectopy. He is status post radiofrequency ablation at the [facility] in the early 90's. for PVC's   Vertigo, benign positional    [name]    Past Surgical History:  Procedure Laterality Date   ANKLE SURGERY  CARDIAC CATHETERIZATION  2002   NORMAL CORONARY ARTERIES   COLON SURGERY  ? 1994   EXTRACORPOREAL SHOCK WAVE LITHOTRIPSY Right 07/23/2017   Procedure: RIGHT EXTRACORPOREAL SHOCK WAVE LITHOTRIPSY (ESWL);  Surgeon: Sebastian Ache, MD;  Location: [facility];  Service: Urology;  Laterality: Right;   HERNIA REPAIR  1990   JOINT REPLACEMENT  ? 2009   LEFT HIP REPLACEMENT   KIDNEY STONE SURGERY     right shoulder rotator cuff repair     TONSILLECTOMY  Age 32   TOTAL HIP ARTHROPLASTY Right 08/04/2013   Procedure: RIGHT TOTAL HIP ARTHROPLASTY ANTERIOR APPROACH;  Surgeon: [name], MD;  Location: [facility];  Service: Orthopedics;  Laterality: Right;   TOTAL KNEE ARTHROPLASTY Bilateral 09/13/2012   Procedure: TOTAL KNEE BILATERAL;  Surgeon: [name], MD;  Location: [facility];  Service: Orthopedics;  Laterality: Bilateral;   TRANSURETHRAL RESECTION OF PROSTATE     US ECHOCARDIOGRAPHY  06-20-2009   Est EF 55-60%    Social History   Socioeconomic History   Marital status: Married    Spouse name: Purnell Shoemaker   Number of children: 3   Years of education: Not on file   Highest education level: Bachelor's degree (e.g., BA, AB, BS)  Occupational History   Not on file  Tobacco Use   Smoking status: Never   Smokeless tobacco: Never  Vaping Use   Vaping status: Never Used  Substance and Sexual Activity   Alcohol use: Yes    Comment: Drink 1 glass Wine almost daily   Drug use: No   Sexual activity: Not on file  Other Topics Concern   Not on file  Social History Narrative   Lives with wife   Social Drivers of Corporate investment banker Strain: Not on file  Food Insecurity: Not on file  Transportation Needs: Not on file  Physical Activity: Not on file  Stress: Not on file  Social Connections: Unknown (09/29/2021)   Received from [facility]   Social Network    Social Network: Not on file  Intimate Partner Violence: Unknown (08/21/2021)   Received from [employer], [facility]   HITS    Physically Hurt: Not on file    Insult or Talk Down To: Not on file    Threaten Physical Harm: Not on file    Scream or Curse: Not on file    Family History  Problem Relation Age of Onset   Heart attack Mother    Other Neg Hx     ROS: no fevers or chills, productive cough, hemoptysis, dysphasia, odynophagia, melena, hematochezia, dysuria, hematuria, rash, seizure activity, orthopnea, PND, pedal edema, claudication. Remaining systems are negative.  Physical Exam: Well-developed well-nourished in no acute distress.  Skin is warm and dry.  HEENT is  normal.  Neck is supple.  Chest is clear to auscultation with normal expansion.  Cardiovascular exam is regular rate and rhythm.  Abdominal exam nontender or distended. No masses palpated. Extremities show no edema. neuro grossly intact  EKG Interpretation Date/Time:  Tuesday July 07 2023 10:33:32 EST Ventricular Rate:  60 PR Interval:  144 QRS Duration:  128 QT Interval:  432 QTC Calculation: 432 R Axis:   -39  Text Interpretation: Normal sinus rhythm Left axis deviation Left ventricular hypertrophy with QRS widening ( R in aVL , Cornell product ) Inferior infarct Confirmed by Olga Millers ([id number]) on 07/07/2023 10:34:55 AM    A/P  1 atrial tachycardia-patient remains in sinus rhythm.  Continue flecainide, metoprolol at present dose.  Can consider referral for ablation versus different antiarrhythmic in the future if he has more frequent episodes.  2 hypertension-blood pressure is controlled.  Continue present medical regimen.  3 chronic dizziness-his symptoms are unchanged and felt likely not cardiac related.  Olga Millers, MD

## 2023-07-07 ENCOUNTER — Ambulatory Visit: Payer: Medicare Other | Attending: Cardiology | Admitting: Cardiology

## 2023-07-07 ENCOUNTER — Encounter: Payer: Self-pay | Admitting: Cardiology

## 2023-07-07 VITALS — BP 146/74 | HR 60 | Ht 72.0 in | Wt 235.0 lb

## 2023-07-07 DIAGNOSIS — I471 Supraventricular tachycardia, unspecified: Secondary | ICD-10-CM | POA: Diagnosis not present

## 2023-07-07 DIAGNOSIS — I10 Essential (primary) hypertension: Secondary | ICD-10-CM | POA: Diagnosis not present

## 2023-07-07 DIAGNOSIS — R42 Dizziness and giddiness: Secondary | ICD-10-CM | POA: Diagnosis not present

## 2023-07-07 NOTE — Patient Instructions (Signed)
   Follow-Up: At [facility], you and your health needs are our priority.  As part of our continuing mission to provide you with exceptional heart care, we have created designated Provider Care Teams.  These Care Teams include your primary Cardiologist (physician) and Advanced Practice Providers (APPs -  Physician Assistants and Nurse Practitioners) who all work together to provide you with the care you need, when you need it.  We recommend signing up for the patient portal called "MyChart".  Sign up information is provided on this After Visit Summary.  MyChart is used to connect with patients for Virtual Visits (Telemedicine).  Patients are able to view lab/test results, encounter notes, upcoming appointments, etc.  Non-urgent messages can be sent to your provider as well.   To learn more about what you can do with MyChart, go to [email or web address].    Your next appointment:   12 month(s)  Provider:   Olga Millers, MD

## 2023-07-08 DIAGNOSIS — N401 Enlarged prostate with lower urinary tract symptoms: Secondary | ICD-10-CM | POA: Diagnosis not present

## 2023-07-08 DIAGNOSIS — R3121 Asymptomatic microscopic hematuria: Secondary | ICD-10-CM | POA: Diagnosis not present

## 2023-07-15 DIAGNOSIS — N401 Enlarged prostate with lower urinary tract symptoms: Secondary | ICD-10-CM | POA: Diagnosis not present

## 2023-07-15 DIAGNOSIS — R351 Nocturia: Secondary | ICD-10-CM | POA: Diagnosis not present

## 2023-07-15 DIAGNOSIS — R350 Frequency of micturition: Secondary | ICD-10-CM | POA: Diagnosis not present

## 2023-07-15 DIAGNOSIS — R3121 Asymptomatic microscopic hematuria: Secondary | ICD-10-CM | POA: Diagnosis not present

## 2023-07-30 ENCOUNTER — Other Ambulatory Visit: Payer: Self-pay | Admitting: Cardiology

## 2023-07-30 DIAGNOSIS — I471 Supraventricular tachycardia, unspecified: Secondary | ICD-10-CM

## 2023-08-22 ENCOUNTER — Other Ambulatory Visit: Payer: Self-pay | Admitting: Cardiology

## 2023-08-26 DIAGNOSIS — Z87442 Personal history of urinary calculi: Secondary | ICD-10-CM | POA: Diagnosis not present

## 2023-08-26 DIAGNOSIS — I471 Supraventricular tachycardia, unspecified: Secondary | ICD-10-CM | POA: Diagnosis not present

## 2023-08-26 DIAGNOSIS — I10 Essential (primary) hypertension: Secondary | ICD-10-CM | POA: Diagnosis not present

## 2023-08-26 DIAGNOSIS — M1990 Unspecified osteoarthritis, unspecified site: Secondary | ICD-10-CM | POA: Diagnosis not present

## 2023-08-26 DIAGNOSIS — H8110 Benign paroxysmal vertigo, unspecified ear: Secondary | ICD-10-CM | POA: Diagnosis not present

## 2023-08-26 DIAGNOSIS — I951 Orthostatic hypotension: Secondary | ICD-10-CM | POA: Diagnosis not present

## 2023-11-09 DIAGNOSIS — D1801 Hemangioma of skin and subcutaneous tissue: Secondary | ICD-10-CM | POA: Diagnosis not present

## 2023-11-09 DIAGNOSIS — L570 Actinic keratosis: Secondary | ICD-10-CM | POA: Diagnosis not present

## 2023-11-09 DIAGNOSIS — L821 Other seborrheic keratosis: Secondary | ICD-10-CM | POA: Diagnosis not present

## 2023-11-09 DIAGNOSIS — C44629 Squamous cell carcinoma of skin of left upper limb, including shoulder: Secondary | ICD-10-CM | POA: Diagnosis not present

## 2023-11-09 DIAGNOSIS — C4401 Basal cell carcinoma of skin of lip: Secondary | ICD-10-CM | POA: Diagnosis not present

## 2023-11-09 DIAGNOSIS — L814 Other melanin hyperpigmentation: Secondary | ICD-10-CM | POA: Diagnosis not present

## 2023-11-09 DIAGNOSIS — C44319 Basal cell carcinoma of skin of other parts of face: Secondary | ICD-10-CM | POA: Diagnosis not present

## 2023-11-09 DIAGNOSIS — L98499 Non-pressure chronic ulcer of skin of other sites with unspecified severity: Secondary | ICD-10-CM | POA: Diagnosis not present

## 2023-11-09 DIAGNOSIS — D0462 Carcinoma in situ of skin of left upper limb, including shoulder: Secondary | ICD-10-CM | POA: Diagnosis not present

## 2023-11-09 DIAGNOSIS — L97821 Non-pressure chronic ulcer of other part of left lower leg limited to breakdown of skin: Secondary | ICD-10-CM | POA: Diagnosis not present

## 2023-11-09 DIAGNOSIS — Z85828 Personal history of other malignant neoplasm of skin: Secondary | ICD-10-CM | POA: Diagnosis not present

## 2023-12-15 DIAGNOSIS — Z85828 Personal history of other malignant neoplasm of skin: Secondary | ICD-10-CM | POA: Diagnosis not present

## 2023-12-15 DIAGNOSIS — C4401 Basal cell carcinoma of skin of lip: Secondary | ICD-10-CM | POA: Diagnosis not present

## 2024-02-18 DIAGNOSIS — Z125 Encounter for screening for malignant neoplasm of prostate: Secondary | ICD-10-CM | POA: Diagnosis not present

## 2024-02-18 DIAGNOSIS — I10 Essential (primary) hypertension: Secondary | ICD-10-CM | POA: Diagnosis not present

## 2024-02-18 DIAGNOSIS — M109 Gout, unspecified: Secondary | ICD-10-CM | POA: Diagnosis not present

## 2024-02-18 DIAGNOSIS — Z1389 Encounter for screening for other disorder: Secondary | ICD-10-CM | POA: Diagnosis not present

## 2024-02-25 DIAGNOSIS — M109 Gout, unspecified: Secondary | ICD-10-CM | POA: Diagnosis not present

## 2024-02-25 DIAGNOSIS — R82998 Other abnormal findings in urine: Secondary | ICD-10-CM | POA: Diagnosis not present

## 2024-02-25 DIAGNOSIS — Z87442 Personal history of urinary calculi: Secondary | ICD-10-CM | POA: Diagnosis not present

## 2024-02-25 DIAGNOSIS — I10 Essential (primary) hypertension: Secondary | ICD-10-CM | POA: Diagnosis not present

## 2024-02-25 DIAGNOSIS — Z1331 Encounter for screening for depression: Secondary | ICD-10-CM | POA: Diagnosis not present

## 2024-02-25 DIAGNOSIS — Z0000 Encounter for general adult medical examination without abnormal findings: Secondary | ICD-10-CM | POA: Diagnosis not present

## 2024-02-25 DIAGNOSIS — I471 Supraventricular tachycardia, unspecified: Secondary | ICD-10-CM | POA: Diagnosis not present

## 2024-02-25 DIAGNOSIS — M1990 Unspecified osteoarthritis, unspecified site: Secondary | ICD-10-CM | POA: Diagnosis not present

## 2024-02-25 DIAGNOSIS — Z8669 Personal history of other diseases of the nervous system and sense organs: Secondary | ICD-10-CM | POA: Diagnosis not present

## 2024-02-25 DIAGNOSIS — Z1339 Encounter for screening examination for other mental health and behavioral disorders: Secondary | ICD-10-CM | POA: Diagnosis not present

## 2024-03-31 ENCOUNTER — Other Ambulatory Visit: Payer: Self-pay | Admitting: Cardiology

## 2024-03-31 NOTE — Telephone Encounter (Signed)
*  PATIENT NEED MAKE AN APPOINTMENT FOR FURTHER REFILLS*  [phone]

## 2024-04-11 ENCOUNTER — Other Ambulatory Visit: Payer: Self-pay | Admitting: Cardiology

## 2024-05-26 ENCOUNTER — Telehealth: Payer: Self-pay | Admitting: Cardiology

## 2024-05-26 NOTE — Telephone Encounter (Signed)
 I spoke with patient's wife.  Patient is not home.  She will have him call office back

## 2024-05-26 NOTE — Telephone Encounter (Signed)
" /  Patient c/o Palpitations: STAT if patient c/o lightheadedness, shortness of breath, or chest pain  How long have you had palpitations/irregular HR/ Afib? Are you having the symptoms now?  Irregular HR the past 3 days.  Are you currently experiencing lightheadedness, SOB or CP?  No   Do you have a history of afib (atrial fibrillation) or irregular heart rhythm?  Yes   Have you checked your BP or HR? (document readings if available):  HR is all over the place--high and low. BP readings: 199/107 50 155/91 54 184/97 51 147/101 76 1/08: 14/94 63  Are you experiencing any other symptoms?  Chest pain coming and going, fatigue  "

## 2024-05-27 NOTE — Telephone Encounter (Signed)
 Spoke with pt. Pt states he feels irregularly heat beat, off and on dizziness, fatigue. Did have chest pain that he believes was acid reflux and went away. Pain has not come back. Hrs have not stayed over 100 for any time. Pt states he has had an ablation in the past at [facility]. Pt is not current a current EP patient. Appt set with Josefa Beauvais 06/03/24. Gave ED/911 precautions. Advised pt I would also send to Dr Pietro.  Note: Pt stated his BP as being 147/101 with a HR 76 but it is my understanding that it maybe 147/76 HR 101.

## 2024-05-27 NOTE — Telephone Encounter (Signed)
 Pt was returning nurse call and is requesting a callback. Please advise.

## 2024-06-01 NOTE — Progress Notes (Unsigned)
 "   Cardiology Clinic Note   Patient Name: [name] [name]. Date of Encounter: 06/01/2024  Primary Care Provider:  Valentin Skates, DO Primary Cardiologist:  Redell Shallow, MD  Patient Profile    [name] [name]. 89 year old male presents to the clinic today for follow-up evaluation of his hypertension and palpitations.  Past Medical History    Past Medical History:  Diagnosis Date   Aortic sclerosis    MILD PER ECHO AS WELL AS TRACE MITRAL REGURGITATION - PER CARDIOLOGY OFFICE NOTES 06/2012   Arthritis    BPH (benign prostatic hyperplasia)    s/p TURP in July 2012   Complication of anesthesia    HX OF MINOR ITCHING AFTER ANESTHESIA FOR HIP REPLACEMENT AND FOR COLON SURGERY   EKG abnormalities    INCOMPLETE RIGHT BUNDLE BRANCH BLOCK AND CHRONIC T-WAVE ABNORMALITIES - PER CARDIOLOGY OFFICE NOTES 06/2012   GERD (gastroesophageal reflux disease)    History of paroxysmal supraventricular tachycardia    [name]   Hypertension    Palpitations    History of ventricular ectopy. He is status post radiofrequency ablation at the [facility] in the early 90's. for PVC's   Vertigo, benign positional    [name]   Past Surgical History:  Procedure Laterality Date   ANKLE SURGERY     CARDIAC CATHETERIZATION  2002   NORMAL CORONARY ARTERIES   COLON SURGERY  ? 1994   EXTRACORPOREAL SHOCK WAVE LITHOTRIPSY Right 07/23/2017   Procedure: RIGHT EXTRACORPOREAL SHOCK WAVE LITHOTRIPSY (ESWL);  Surgeon: Alvaro Hummer, MD;  Location: [facility];  Service: Urology;  Laterality: Right;   HERNIA REPAIR  1990   JOINT REPLACEMENT  ? 2009   LEFT HIP REPLACEMENT   KIDNEY STONE SURGERY     right shoulder rotator cuff repair     TONSILLECTOMY  Age 70   TOTAL HIP ARTHROPLASTY Right 08/04/2013   Procedure: RIGHT TOTAL HIP ARTHROPLASTY ANTERIOR APPROACH;  Surgeon: Donnice JONETTA Car, MD;  Location: [facility];  Service: Orthopedics;   Laterality: Right;   TOTAL KNEE ARTHROPLASTY Bilateral 09/13/2012   Procedure: TOTAL KNEE BILATERAL;  Surgeon: Donnice JONETTA Car, MD;  Location: [facility];  Service: Orthopedics;  Laterality: Bilateral;   TRANSURETHRAL RESECTION OF PROSTATE     US  ECHOCARDIOGRAPHY  06-20-2009   Est EF 55-60%    Allergies  Allergies[1]  History of Present Illness    [name] [name]. has a PMH of hypertension, SVT, dizziness, and PVC ablation.  His ablation was done at the [facility] clinic in the early 1990s.  He underwent cardiac catheterization in 2001 which showed normal coronary arteries.  He wore a repeat monitor 5/17 which showed sinus rhythm with PVCs and SVT.  He was seen by Dr. Fernande and placed on flecainide  for atrial tachycardia.  He had improvement in his symptoms.  He was seen by Dr. Kelsie for possible ablation but patient preferred medical management.  Nuclear stress test 7/19 showed an EF of 46% and normal perfusion.  He had carotid Dopplers 5/22 which showed 1-39 bilateral stenosis.  His echocardiogram 10/22 showed normal LV function, moderate left atrial enlargement, mild mitral regurgitation, mild aortic insufficiency.  He had exercise tolerance test 9/24 which showed no ST changes but was nondiagnostic due to achieving 74% of predicted maximum heart rate.  No exercise-induced VT was noted.  He was seen in follow-up by Dr. Shallow on 07/07/2023.  During that time he denied dyspnea, PND, lower extremity edema, chest pain, syncope, and palpitations.  He presents to the clinic today for follow-up evaluation and states***.  *** denies chest pain, shortness of breath, lower extremity edema, fatigue, palpitations, melena, hematuria, hemoptysis, diaphoresis, weakness, presyncope, syncope, orthopnea, and PND.  Essential hypertension-BP today***. Maintain blood pressure log Heart healthy low-sodium diet Continue current medical therapy  Palpitations, SVT-EKG today shows***.  He is status post ablation done  at the [facility] clinic in the early 1990s.  He was placed on flecainide  by Dr. Fernande.  He was seen by Dr. Kelsie and repeat ablation was discussed.  He preferred medical management. Avoid triggers caffeine, chocolate, EtOH, dehydration excetra. Continue flecainide , metoprolol  Order CBC, BMP  Dizziness-denies recent episodes. Maintain p.o. hydration Change positions slowly Allow time before ambulation after standing May use lower extremity support stockings  Disposition: Follow-up with Dr. Pietro or me in 6-9 months.  Home Medications    Prior to Admission medications  Medication Sig Start Date End Date Taking? Authorizing Provider  celecoxib  (CELEBREX ) 200 MG capsule Take 200 mg by mouth every other day.    [provider]  Cholecalciferol  (VITAMIN D PO) Take 2,000 Units by mouth daily.    [provider]  flecainide  (TAMBOCOR ) 100 MG tablet TAKE ONE TABLET TWICE DAILY 07/30/23   Pietro Redell RAMAN, MD  GEMTESA 75 MG TABS Take 1 tablet by mouth daily. 11/24/22   [provider]  influenza vaccine adjuvanted (FLUAD  QUADRIVALENT) 0.5 ML injection Inject into the muscle. Patient not taking: Reported on 07/07/2023 02/17/22   Luiz Channel, MD  losartan  (COZAAR ) 50 MG tablet TAKE ONE TABLET DAILY 03/31/24   Pietro Redell RAMAN, MD  metoprolol  succinate (TOPROL -XL) 25 MG 24 hr tablet TAKE ONE TABLET BY MOUTH DAILY 07/30/23   Pietro Redell RAMAN, MD  metoprolol  succinate (TOPROL -XL) 50 MG 24 hr tablet TAKE ONE TABLET BY MOUTH DAILY 08/24/23   Pietro Redell RAMAN, MD  metoprolol  tartrate (LOPRESSOR ) 50 MG tablet TAKE AS NEEDED -TAKE 1/2 TO 1 TABLET IF HEART RATE GREATER THAN 90 04/18/24   Pietro Redell RAMAN, MD  polycarbophil (FIBERCON) 625 MG tablet Take 625 mg by mouth daily as needed for mild constipation.    [provider]  potassium citrate  (UROCIT-K ) 10 MEQ (1080 MG) SR tablet Take 10 mEq by mouth daily.     [provider]  predniSONE  (DELTASONE ) 20 MG  tablet Take 2 tablets (40 mg total) by mouth daily. Patient not taking: Reported on 07/07/2023 12/30/22   Doretha Folks, MD    Family History    Family History  Problem Relation Age of Onset   Heart attack Mother    Other Neg Hx    He indicated that his mother is deceased. He indicated that his father is deceased. He indicated that the status of his neg hx is unknown.  Social History    Social History   Socioeconomic History   Marital status: Married    Spouse name: Theoplis   Number of children: 3   Years of education: Not on file   Highest education level: Bachelor's degree (e.g., BA, AB, BS)  Occupational History   Not on file  Tobacco Use   Smoking status: Never   Smokeless tobacco: Never  Vaping Use   Vaping status: Never Used  Substance and Sexual Activity   Alcohol  use: Yes    Comment: Drink 1 glass Wine almost daily   Drug use: No   Sexual  activity: Not on file  Other Topics Concern   Not on file  Social History Narrative   Lives with wife   Social Drivers of Health   Tobacco Use: Low Risk (07/07/2023)   Patient History    Smoking Tobacco Use: Never    Smokeless Tobacco Use: Never    Passive Exposure: Not on file  Financial Resource Strain: Not on file  Food Insecurity: Not on file  Transportation Needs: Not on file  Physical Activity: Not on file  Stress: Not on file  Social Connections: Unknown (09/29/2021)   Received from [facility]   Social Network    Social Network: Not on file  Intimate Partner Violence: Unknown (08/21/2021)   Received from [facility]   HITS    Physically Hurt: Not on file    Insult or Talk Down To: Not on file    Threaten Physical Harm: Not on file    Scream or Curse: Not on file  Depression (PHQ2-9): Not on file  Alcohol  Screen: Not on file  Housing: Not on file  Utilities: Not on file  Health Literacy: Not on file     Review of Systems    General:  No chills, fever, night sweats or weight changes.   Cardiovascular:  No chest pain, dyspnea on exertion, edema, orthopnea, palpitations, paroxysmal nocturnal dyspnea. Dermatological: No rash, lesions/masses Respiratory: No cough, dyspnea Urologic: No hematuria, dysuria Abdominal:   No nausea, vomiting, diarrhea, bright red blood per rectum, melena, or hematemesis Neurologic:  No visual changes, wkns, changes in mental status. All other systems reviewed and are otherwise negative except as noted above.  Physical Exam    VS:  There were no vitals taken for this visit. , BMI There is no height or weight on file to calculate BMI. GEN: Well nourished, well developed, in no acute distress. HEENT: normal. Neck: Supple, no JVD, carotid bruits, or masses. Cardiac: RRR, no murmurs, rubs, or gallops. No clubbing, cyanosis, edema.  Radials/DP/PT 2+ and equal bilaterally.  Respiratory:  Respirations regular and unlabored, clear to auscultation bilaterally. GI: Soft, nontender, nondistended, BS + x 4. MS: no deformity or atrophy. Skin: warm and dry, no rash. Neuro:  Strength and sensation are intact. Psych: Normal affect.  Accessory Clinical Findings    Recent Labs: No results found for requested labs within last 365 days.   Recent Lipid Panel No results found for: CHOL, TRIG, HDL, CHOLHDL, VLDL, LDLCALC, LDLDIRECT  No BP recorded.  {Refresh Note OR Click here to enter BP  :1}***    ECG personally reviewed by me today- ***     ETT 02/16/2023  Resting ECG ECG is normal. ECG rhythm shows normal sinus rhythm. ECG demonstrates incomplete right bundle branch block.  Stress Findings A Bruce protocol stress test was performed. Exercise capacity was moderately impaired. Patient exercised for 3 min and 44 sec. Maximum HR of 101 bpm. MPHR 74.0%. Peak METS 5.0.  The patient experienced no angina during the test. The test was stopped because the patient experienced fatigue and dyspnea. The patient reported dyspnea during the stress test.  Normal blood pressure and blunted heart rate response noted during stress.  Stress ECG No ST deviation was noted. There were no arrhythmias during stress. ECG was uninterpretable. The ECG was not diagnostic due to failure to achieve 85% MAPHR.       Assessment & Plan   1.  ***   Josefa HERO. Jahlani Lorentz NP-C     06/01/2024, 7:35 AM [facility] [address] [place], [place] [address] Office [phone]    Notice: This dictation was prepared with Dragon dictation along with smaller phrase technology. Any transcriptional errors that result from this process are unintentional and may not be corrected upon review.   I spent***minutes examining this patient, reviewing medications, and using patient centered shared decision making involving their cardiac care.   I spent  20 minutes reviewing past medical history,  medications, and prior cardiac tests.     [1]  Allergies Allergen Reactions   Oxycodone  Other (See Comments)    Angry and confused  Other Reaction(s): hallucinations, Unknown   Hydrocodone      Other Reaction(s): Not available, Unknown   Hydrocodone -Acetaminophen  Other (See Comments)    Confusion   Ramipril     Cough   "

## 2024-06-03 ENCOUNTER — Telehealth: Payer: Self-pay | Admitting: Cardiology

## 2024-06-03 ENCOUNTER — Ambulatory Visit: Admitting: General Practice

## 2024-06-03 NOTE — Telephone Encounter (Signed)
 Spoke with pt, he went to the wrong building for the appointment today. He feels like he is having a fib again. He reports his pulse always seems to be irregular. Follow up scheduled to see dr pietro. He does not have a watch or kardia mobile to run ECG.

## 2024-06-03 NOTE — Telephone Encounter (Signed)
 Pt would like a c/b regarding missed appt this morning and sooner appt than what he was rescheduled for. Please advise

## 2024-06-04 NOTE — Progress Notes (Signed)
 "    HPI: FU SVT and hypertension. He has a past history of having had ablation for frequent PVCs done at the [facility] clinic in the early 1990s. He had a cardiac catheterization in 2001 showing normal coronary arteries. Repeat monitor 5/17 showed sinus with pvcs and SVT. Seen by Dr Fernande and placed on flecanide for atrial tach with improvement in symptoms; seen by Dr Kelsie for possible ablation but pt preferred medical therapy. Nuclear study July 2019 showed ejection fraction 46% and normal perfusion. Carotid Dopplers May 2022 showed 1 to 39% bilateral stenosis. Echocardiogram October 2022 showed normal LV function, moderate left atrial enlargement, mild mitral regurgitation with splay artifact, mild aortic insufficiency. Exercise tolerance test September 2024 showed no ST changes but was nondiagnostic as he achieved 74% of predicted maximal heart rate; no exercise-induced VT noted.  Since last seen, patient has occasional indigestion that is not related to exertion.  He denies dyspnea or pedal edema and no history of bleeding or falls.  He has had occasional dizziness transiently.  He states his first episode of this was approximately 5 years ago.  At 1 point he felt as though he may pass out.  He  Current Outpatient Medications  Medication Sig Dispense Refill   celecoxib  (CELEBREX ) 200 MG capsule Take 200 mg by mouth every other day.     Cholecalciferol  (VITAMIN D PO) Take 2,000 Units by mouth daily.     flecainide  (TAMBOCOR ) 100 MG tablet TAKE ONE TABLET TWICE DAILY 180 tablet 3   GEMTESA 75 MG TABS Take 1 tablet by mouth daily.     losartan  (COZAAR ) 50 MG tablet TAKE ONE TABLET DAILY 90 tablet 0   metoprolol  succinate (TOPROL -XL) 25 MG 24 hr tablet TAKE ONE TABLET BY MOUTH DAILY 90 tablet 3   metoprolol  succinate (TOPROL -XL) 50 MG 24 hr tablet TAKE ONE TABLET BY MOUTH DAILY 90 tablet 3   polycarbophil (FIBERCON) 625 MG tablet Take 625 mg by mouth daily as needed for mild constipation.      potassium citrate  (UROCIT-K ) 10 MEQ (1080 MG) SR tablet Take 10 mEq by mouth daily.      No current facility-administered medications for this visit.     Past Medical History:  Diagnosis Date   Aortic sclerosis    MILD PER ECHO AS WELL AS TRACE MITRAL REGURGITATION - PER CARDIOLOGY OFFICE NOTES 06/2012   Arthritis    BPH (benign prostatic hyperplasia)    s/p TURP in July 2012   Complication of anesthesia    HX OF MINOR ITCHING AFTER ANESTHESIA FOR HIP REPLACEMENT AND FOR COLON SURGERY   EKG abnormalities    INCOMPLETE RIGHT BUNDLE BRANCH BLOCK AND CHRONIC T-WAVE ABNORMALITIES - PER CARDIOLOGY OFFICE NOTES 06/2012   GERD (gastroesophageal reflux disease)    History of paroxysmal supraventricular tachycardia    [name]   Hypertension    Palpitations    History of ventricular ectopy. He is status post radiofrequency ablation at the [facility] in the early 90's. for PVC's   Vertigo, benign positional    [name]    Past Surgical History:  Procedure Laterality Date   ANKLE SURGERY     CARDIAC CATHETERIZATION  2002   NORMAL CORONARY ARTERIES   COLON SURGERY  ? 1994   EXTRACORPOREAL SHOCK WAVE LITHOTRIPSY Right 07/23/2017   Procedure: RIGHT EXTRACORPOREAL SHOCK WAVE LITHOTRIPSY (ESWL);  Surgeon: Alvaro Hummer, MD;  Location:  [facility];  Service: Urology;  Laterality: Right;   HERNIA REPAIR  1990   JOINT REPLACEMENT  ? 2009   LEFT HIP REPLACEMENT   KIDNEY STONE SURGERY     right shoulder rotator cuff repair     TONSILLECTOMY  Age 23   TOTAL HIP ARTHROPLASTY Right 08/04/2013   Procedure: RIGHT TOTAL HIP ARTHROPLASTY ANTERIOR APPROACH;  Surgeon: Donnice JONETTA Car, MD;  Location: [facility];  Service: Orthopedics;  Laterality: Right;   TOTAL KNEE ARTHROPLASTY Bilateral 09/13/2012   Procedure: TOTAL KNEE BILATERAL;  Surgeon: Donnice JONETTA Car, MD;  Location: [facility];  Service: Orthopedics;  Laterality: Bilateral;    TRANSURETHRAL RESECTION OF PROSTATE     US  ECHOCARDIOGRAPHY  06-20-2009   Est EF 55-60%    Social History   Socioeconomic History   Marital status: Married    Spouse name: Theoplis   Number of children: 3   Years of education: Not on file   Highest education level: Bachelor's degree (e.g., BA, AB, BS)  Occupational History   Not on file  Tobacco Use   Smoking status: Never   Smokeless tobacco: Never  Vaping Use   Vaping status: Never Used  Substance and Sexual Activity   Alcohol  use: Yes    Comment: Drink 1 glass Wine almost daily   Drug use: No   Sexual activity: Not on file  Other Topics Concern   Not on file  Social History Narrative   Lives with wife   Social Drivers of Health   Tobacco Use: Low Risk (06/14/2024)   Patient History    Smoking Tobacco Use: Never    Smokeless Tobacco Use: Never    Passive Exposure: Not on file  Financial Resource Strain: Not on file  Food Insecurity: Not on file  Transportation Needs: Not on file  Physical Activity: Not on file  Stress: Not on file  Social Connections: Unknown (09/29/2021)   [facility]   Social Network    Social Network: Not on file  Intimate Partner Violence: Unknown (08/21/2021)   Received from [facility]   HITS    Physically Hurt: Not on file    Insult or Talk Down To: Not on file    Threaten Physical Harm: Not on file    Scream or Curse: Not on file  Depression (PHQ2-9): Not on file  Alcohol  Screen: Not on file  Housing: Not on file  Utilities: Not on file  Health Literacy: Not on file    Family History  Problem Relation Age of Onset   Heart attack Mother    Other Neg Hx     ROS: no fevers or chills, productive cough, hemoptysis, dysphasia, odynophagia, melena, hematochezia, dysuria, hematuria, rash, seizure activity, orthopnea, PND, pedal edema, claudication. Remaining systems are negative.  Physical Exam: Well-developed well-nourished in no acute distress.  Skin is warm and  dry.  HEENT is normal.  Neck is supple.  Chest is clear to auscultation with normal expansion.  Cardiovascular exam is irregular Abdominal exam nontender or distended. No masses palpated. Extremities show no edema. neuro grossly intact  EKG Interpretation Date/Time:  Tuesday June 14 2024 13:31:15 EST Ventricular Rate:  62 PR Interval:    QRS Duration:  114 QT Interval:  400 QTC Calculation: 406 R Axis:   -46  Text Interpretation: Atrial fibrillation Left axis deviation Incomplete right bundle branch block Minimal voltage criteria for LVH, may be normal variant ( R in aVL ) Inferior infarct Anterolateral infarct , age  undetermined Confirmed by Pietro Rogue ([id number]) on 06/14/2024 1:32:20 PM    A/P  1 atrial tachycardia-patient is in sinus rhythm today.  Continue metoprolol  and flecainide  at present dose.  Will consider referral for ablation in the future if he has more frequent episodes.  2 hypertension-patient's blood pressure is controlled.  Continue present medications.  3 chronic dizziness-symptoms remain unchanged and felt not to be cardiac in etiology.  Note he has had episodes of transient dizziness and 1 episode he may pass out.  If these continue may need to place monitor to rule out bradycardia.  4 newly diagnosed atrial fibrillation-patient's ECG today shows atrial fibrillation with controlled ventricular response.  Continue metoprolol .  Will begin apixaban  5 mg twice daily.  Check hemoglobin and renal function.  If creatinine 1.5 or greater will need to decrease apixaban  to 2.5 mg twice daily.  Repeat echocardiogram.  Proceed with cardioversion 21 days after therapeutically anticoagulated (I will have him see one of our assistants in 3 to 4 weeks to see if atrial fibrillation persist).  Continue flecainide .  If atrial fibrillation becomes more frequent in the future can consider changing antiarrhythmics versus referral for ablation.  Check TSH.  5 abnormal ECG-question  inferior and anterior infarct on today's electrocardiogram.  Will repeat echocardiogram to assess wall motion.  Rogue Pietro, MD    "

## 2024-06-14 ENCOUNTER — Ambulatory Visit: Attending: Cardiology | Admitting: Cardiology

## 2024-06-14 ENCOUNTER — Other Ambulatory Visit (HOSPITAL_COMMUNITY): Payer: Self-pay

## 2024-06-14 ENCOUNTER — Encounter: Payer: Self-pay | Admitting: Cardiology

## 2024-06-14 VITALS — BP 120/90 | HR 62 | Ht 72.0 in | Wt 231.4 lb

## 2024-06-14 DIAGNOSIS — I493 Ventricular premature depolarization: Secondary | ICD-10-CM | POA: Insufficient documentation

## 2024-06-14 DIAGNOSIS — Z79899 Other long term (current) drug therapy: Secondary | ICD-10-CM | POA: Insufficient documentation

## 2024-06-14 DIAGNOSIS — I10 Essential (primary) hypertension: Secondary | ICD-10-CM | POA: Diagnosis not present

## 2024-06-14 DIAGNOSIS — R42 Dizziness and giddiness: Secondary | ICD-10-CM | POA: Insufficient documentation

## 2024-06-14 DIAGNOSIS — I4891 Unspecified atrial fibrillation: Secondary | ICD-10-CM | POA: Insufficient documentation

## 2024-06-14 DIAGNOSIS — I471 Supraventricular tachycardia, unspecified: Secondary | ICD-10-CM | POA: Insufficient documentation

## 2024-06-14 LAB — CBC
Hematocrit: 49.6 % (ref 37.5–51.0)
Hemoglobin: 16.9 g/dL (ref 13.0–17.7)
MCH: 31 pg (ref 26.6–33.0)
MCHC: 34.1 g/dL (ref 31.5–35.7)
MCV: 91 fL (ref 79–97)
Platelets: 110 10*3/uL — ABNORMAL LOW (ref 150–450)
RBC: 5.45 x10E6/uL (ref 4.14–5.80)
RDW: 14.1 % (ref 11.6–15.4)
WBC: 7.4 10*3/uL (ref 3.4–10.8)

## 2024-06-14 LAB — BASIC METABOLIC PANEL WITH GFR
BUN/Creatinine Ratio: 16 (ref 10–24)
BUN: 24 mg/dL (ref 8–27)
CO2: 22 mmol/L (ref 20–29)
Calcium: 9.4 mg/dL (ref 8.6–10.2)
Chloride: 105 mmol/L (ref 96–106)
Creatinine, Ser: 1.46 mg/dL — ABNORMAL HIGH (ref 0.76–1.27)
Glucose: 90 mg/dL (ref 70–99)
Potassium: 4.8 mmol/L (ref 3.5–5.2)
Sodium: 139 mmol/L (ref 134–144)
eGFR: 47 mL/min/{1.73_m2} — ABNORMAL LOW

## 2024-06-14 LAB — TSH: TSH: 2.55 u[IU]/mL (ref 0.450–4.500)

## 2024-06-14 MED ORDER — APIXABAN 5 MG PO TABS
5.0000 mg | ORAL_TABLET | Freq: Two times a day (BID) | ORAL | 11 refills | Status: AC
  Filled 2024-06-14: qty 60, 30d supply, fill #0

## 2024-06-14 NOTE — Patient Instructions (Signed)
 Medication Instructions:  Eliquis  5 mg twice daily *If you need a refill on your cardiac medications before your next appointment, please call your pharmacy*  Lab Work: CBC, BMP, TSH If you have labs (blood work) drawn today and your tests are completely normal, you will receive your results only by: MyChart Message (if you have MyChart) OR A paper copy in the mail If you have any lab test that is abnormal or we need to change your treatment, we will call you to review the results.  Testing/Procedures: Your physician has requested that you have an echocardiogram. Echocardiography is a painless test that uses sound waves to create images of your heart. It provides your doctor with information about the size and shape of your heart and how well your hearts chambers and valves are working. This procedure takes approximately one hour. There are no restrictions for this procedure. Please do NOT wear cologne, perfume, aftershave, or lotions (deodorant is allowed). Please arrive 15 minutes prior to your appointment time.  Please note: We ask at that you not bring children with you during ultrasound (echo/ vascular) testing. Due to room size and safety concerns, children are not allowed in the ultrasound rooms during exams. Our front office staff cannot provide observation of children in our lobby area while testing is being conducted. An adult accompanying a patient to their appointment will only be allowed in the ultrasound room at the discretion of the ultrasound technician under special circumstances. We apologize for any inconvenience.   Follow-Up: At [facility], you and your health needs are our priority.  As part of our continuing mission to provide you with exceptional heart care, our providers are all part of one team.  This team includes your primary Cardiologist (physician) and Advanced Practice Providers or APPs (Physician Assistants and Nurse Practitioners) who all work together  to provide you with the care you need, when you need it.  Your next appointment:   APP- 4 weeks  Dr Pietro 3 months  We recommend signing up for the patient portal called MyChart.  Sign up information is provided on this After Visit Summary.  MyChart is used to connect with patients for Virtual Visits (Telemedicine).  Patients are able to view lab/test results, encounter notes, upcoming appointments, etc.  Non-urgent messages can be sent to your provider as well.   To learn more about what you can do with MyChart, go to [email or web address].

## 2024-06-15 ENCOUNTER — Ambulatory Visit: Payer: Self-pay | Admitting: Cardiology

## 2024-06-15 DIAGNOSIS — Z8679 Personal history of other diseases of the circulatory system: Secondary | ICD-10-CM

## 2024-07-06 ENCOUNTER — Ambulatory Visit: Admitting: General Practice

## 2024-07-12 ENCOUNTER — Ambulatory Visit (HOSPITAL_COMMUNITY)

## 2024-07-18 ENCOUNTER — Ambulatory Visit: Admitting: Cardiology

## 2024-10-07 ENCOUNTER — Ambulatory Visit: Admitting: Cardiology
